# Patient Record
Sex: Female | Born: 1960 | Race: Black or African American | Hispanic: No | Marital: Married | Smoking: Never smoker
Health system: Southern US, Community
[De-identification: ages and names within clinical notes are randomized; demographics above are authoritative.]

## PROBLEM LIST (undated history)

## (undated) DIAGNOSIS — L659 Nonscarring hair loss, unspecified: Secondary | ICD-10-CM

## (undated) DIAGNOSIS — E7800 Pure hypercholesterolemia, unspecified: Secondary | ICD-10-CM

## (undated) DIAGNOSIS — Z79899 Other long term (current) drug therapy: Principal | ICD-10-CM

## (undated) DIAGNOSIS — K589 Irritable bowel syndrome without diarrhea: Secondary | ICD-10-CM

## (undated) DIAGNOSIS — Z87442 Personal history of urinary calculi: Secondary | ICD-10-CM

## (undated) DIAGNOSIS — K219 Gastro-esophageal reflux disease without esophagitis: Secondary | ICD-10-CM

## (undated) DIAGNOSIS — E739 Lactose intolerance, unspecified: Secondary | ICD-10-CM

## (undated) DIAGNOSIS — M5430 Sciatica, unspecified side: Secondary | ICD-10-CM

## (undated) DIAGNOSIS — L658 Other specified nonscarring hair loss: Secondary | ICD-10-CM

## (undated) DIAGNOSIS — F329 Major depressive disorder, single episode, unspecified: Secondary | ICD-10-CM

## (undated) DIAGNOSIS — I10 Essential (primary) hypertension: Secondary | ICD-10-CM

## (undated) DIAGNOSIS — R0602 Shortness of breath: Secondary | ICD-10-CM

## (undated) DIAGNOSIS — R7303 Prediabetes: Secondary | ICD-10-CM

## (undated) DIAGNOSIS — M545 Low back pain, unspecified: Secondary | ICD-10-CM

## (undated) DIAGNOSIS — K5900 Constipation, unspecified: Secondary | ICD-10-CM

## (undated) DIAGNOSIS — R232 Flushing: Secondary | ICD-10-CM

## (undated) HISTORY — DX: Constipation, unspecified: K59.00

## (undated) HISTORY — DX: Other specified nonscarring hair loss: L65.8

## (undated) HISTORY — DX: Essential (primary) hypertension: I10

## (undated) HISTORY — DX: Sciatica, unspecified side: M54.30

## (undated) HISTORY — DX: Gastro-esophageal reflux disease without esophagitis: K21.9

## (undated) HISTORY — DX: Prediabetes: R73.03

## (undated) HISTORY — DX: Flushing: R23.2

## (undated) HISTORY — DX: Shortness of breath: R06.02

## (undated) HISTORY — DX: Pure hypercholesterolemia, unspecified: E78.00

## (undated) HISTORY — PX: OTHER SURGICAL HISTORY: SHX169

## (undated) HISTORY — DX: Irritable bowel syndrome, unspecified: K58.9

## (undated) HISTORY — DX: Low back pain, unspecified: M54.50

## (undated) HISTORY — PX: ESOPHAGOGASTRODUODENOSCOPY: SHX1529

## (undated) HISTORY — PX: CHOLECYSTECTOMY: SHX55

## (undated) HISTORY — DX: Major depressive disorder, single episode, unspecified: F32.9

## (undated) HISTORY — DX: Personal history of urinary calculi: Z87.442

## (undated) HISTORY — DX: Lactose intolerance, unspecified: E73.9

## (undated) HISTORY — PX: TUBAL LIGATION: SHX77

## (undated) HISTORY — DX: Nonscarring hair loss, unspecified: L65.9

## (undated) HISTORY — DX: Other long term (current) drug therapy: Z79.899

---

## 2001-03-22 ENCOUNTER — Other Ambulatory Visit: Admission: RE | Admit: 2001-03-22 | Discharge: 2001-03-22 | Payer: Self-pay | Admitting: Obstetrics and Gynecology

## 2001-03-25 ENCOUNTER — Ambulatory Visit (HOSPITAL_COMMUNITY): Admission: RE | Admit: 2001-03-25 | Discharge: 2001-03-25 | Payer: Self-pay | Admitting: Obstetrics and Gynecology

## 2001-03-25 ENCOUNTER — Encounter: Payer: Self-pay | Admitting: Obstetrics and Gynecology

## 2001-05-10 ENCOUNTER — Other Ambulatory Visit: Admission: RE | Admit: 2001-05-10 | Discharge: 2001-05-10 | Payer: Self-pay | Admitting: Obstetrics and Gynecology

## 2003-01-28 ENCOUNTER — Ambulatory Visit (HOSPITAL_COMMUNITY): Admission: RE | Admit: 2003-01-28 | Discharge: 2003-01-28 | Payer: Self-pay | Admitting: Family Medicine

## 2003-01-28 ENCOUNTER — Encounter: Payer: Self-pay | Admitting: Family Medicine

## 2003-09-16 ENCOUNTER — Encounter (HOSPITAL_COMMUNITY): Admission: RE | Admit: 2003-09-16 | Discharge: 2003-10-16 | Payer: Self-pay | Admitting: Orthopedic Surgery

## 2004-04-08 ENCOUNTER — Ambulatory Visit (HOSPITAL_COMMUNITY): Admission: RE | Admit: 2004-04-08 | Discharge: 2004-04-08 | Payer: Self-pay | Admitting: Family Medicine

## 2004-05-12 ENCOUNTER — Emergency Department (HOSPITAL_COMMUNITY): Admission: EM | Admit: 2004-05-12 | Discharge: 2004-05-12 | Payer: Self-pay | Admitting: *Deleted

## 2004-12-30 ENCOUNTER — Ambulatory Visit (HOSPITAL_COMMUNITY): Admission: RE | Admit: 2004-12-30 | Discharge: 2004-12-30 | Payer: Self-pay | Admitting: Family Medicine

## 2005-01-04 ENCOUNTER — Ambulatory Visit (HOSPITAL_COMMUNITY): Admission: RE | Admit: 2005-01-04 | Discharge: 2005-01-04 | Payer: Self-pay | Admitting: General Surgery

## 2005-01-17 HISTORY — PX: OTHER SURGICAL HISTORY: SHX169

## 2005-02-03 ENCOUNTER — Inpatient Hospital Stay (HOSPITAL_COMMUNITY): Admission: RE | Admit: 2005-02-03 | Discharge: 2005-02-05 | Payer: Self-pay | Admitting: General Surgery

## 2005-02-03 ENCOUNTER — Encounter (INDEPENDENT_AMBULATORY_CARE_PROVIDER_SITE_OTHER): Payer: Self-pay | Admitting: General Surgery

## 2006-04-12 ENCOUNTER — Ambulatory Visit: Payer: Self-pay | Admitting: Orthopedic Surgery

## 2006-04-17 ENCOUNTER — Ambulatory Visit (HOSPITAL_COMMUNITY): Admission: RE | Admit: 2006-04-17 | Discharge: 2006-04-17 | Payer: Self-pay | Admitting: Orthopedic Surgery

## 2006-04-25 ENCOUNTER — Ambulatory Visit: Payer: Self-pay | Admitting: Orthopedic Surgery

## 2006-05-30 ENCOUNTER — Encounter: Admission: RE | Admit: 2006-05-30 | Discharge: 2006-05-30 | Payer: Self-pay | Admitting: Orthopedic Surgery

## 2006-06-06 ENCOUNTER — Ambulatory Visit: Payer: Self-pay | Admitting: Orthopedic Surgery

## 2006-06-21 ENCOUNTER — Encounter: Admission: RE | Admit: 2006-06-21 | Discharge: 2006-06-21 | Payer: Self-pay | Admitting: Orthopedic Surgery

## 2006-07-10 ENCOUNTER — Encounter: Admission: RE | Admit: 2006-07-10 | Discharge: 2006-07-10 | Payer: Self-pay | Admitting: Orthopedic Surgery

## 2007-01-24 ENCOUNTER — Ambulatory Visit (HOSPITAL_COMMUNITY): Admission: RE | Admit: 2007-01-24 | Discharge: 2007-01-24 | Payer: Self-pay | Admitting: Family Medicine

## 2007-02-04 ENCOUNTER — Ambulatory Visit (HOSPITAL_COMMUNITY): Admission: RE | Admit: 2007-02-04 | Discharge: 2007-02-04 | Payer: Self-pay | Admitting: Family Medicine

## 2007-02-22 ENCOUNTER — Ambulatory Visit (HOSPITAL_COMMUNITY): Admission: RE | Admit: 2007-02-22 | Discharge: 2007-02-22 | Payer: Self-pay | Admitting: Obstetrics and Gynecology

## 2007-02-26 ENCOUNTER — Ambulatory Visit: Admission: RE | Admit: 2007-02-26 | Discharge: 2007-02-26 | Payer: Self-pay | Admitting: Gynecology

## 2007-03-26 ENCOUNTER — Encounter (INDEPENDENT_AMBULATORY_CARE_PROVIDER_SITE_OTHER): Payer: Self-pay | Admitting: Obstetrics and Gynecology

## 2007-03-26 ENCOUNTER — Inpatient Hospital Stay (HOSPITAL_COMMUNITY): Admission: RE | Admit: 2007-03-26 | Discharge: 2007-03-28 | Payer: Self-pay | Admitting: Obstetrics and Gynecology

## 2007-04-30 ENCOUNTER — Ambulatory Visit: Admission: RE | Admit: 2007-04-30 | Discharge: 2007-04-30 | Payer: Self-pay | Admitting: Gynecology

## 2007-09-05 ENCOUNTER — Ambulatory Visit (HOSPITAL_COMMUNITY): Admission: RE | Admit: 2007-09-05 | Discharge: 2007-09-05 | Payer: Self-pay | Admitting: Family Medicine

## 2007-09-25 ENCOUNTER — Emergency Department (HOSPITAL_COMMUNITY): Admission: EM | Admit: 2007-09-25 | Discharge: 2007-09-25 | Payer: Self-pay | Admitting: Emergency Medicine

## 2007-09-27 ENCOUNTER — Ambulatory Visit (HOSPITAL_COMMUNITY): Admission: RE | Admit: 2007-09-27 | Discharge: 2007-09-27 | Payer: Self-pay | Admitting: Family Medicine

## 2007-09-28 ENCOUNTER — Ambulatory Visit (HOSPITAL_COMMUNITY): Admission: RE | Admit: 2007-09-28 | Discharge: 2007-09-28 | Payer: Self-pay | Admitting: Family Medicine

## 2007-10-04 ENCOUNTER — Encounter: Payer: Self-pay | Admitting: Family Medicine

## 2007-10-04 ENCOUNTER — Ambulatory Visit: Payer: Self-pay | Admitting: Cardiology

## 2007-10-04 ENCOUNTER — Ambulatory Visit (HOSPITAL_COMMUNITY): Admission: RE | Admit: 2007-10-04 | Discharge: 2007-10-04 | Payer: Self-pay | Admitting: Family Medicine

## 2008-10-06 ENCOUNTER — Ambulatory Visit (HOSPITAL_COMMUNITY): Admission: RE | Admit: 2008-10-06 | Discharge: 2008-10-06 | Payer: Self-pay | Admitting: Family Medicine

## 2008-10-08 ENCOUNTER — Encounter: Admission: RE | Admit: 2008-10-08 | Discharge: 2008-10-08 | Payer: Self-pay | Admitting: Family Medicine

## 2009-10-18 ENCOUNTER — Ambulatory Visit (HOSPITAL_COMMUNITY): Admission: RE | Admit: 2009-10-18 | Discharge: 2009-10-18 | Payer: Self-pay | Admitting: Family Medicine

## 2009-10-27 ENCOUNTER — Ambulatory Visit (HOSPITAL_COMMUNITY): Admission: RE | Admit: 2009-10-27 | Discharge: 2009-10-27 | Payer: Self-pay | Admitting: Family Medicine

## 2009-11-08 ENCOUNTER — Emergency Department (HOSPITAL_COMMUNITY): Admission: EM | Admit: 2009-11-08 | Discharge: 2009-11-08 | Payer: Self-pay | Admitting: Emergency Medicine

## 2010-05-04 ENCOUNTER — Ambulatory Visit (HOSPITAL_COMMUNITY): Admission: RE | Admit: 2010-05-04 | Discharge: 2010-05-04 | Payer: Self-pay | Admitting: Family Medicine

## 2010-07-09 ENCOUNTER — Encounter: Payer: Self-pay | Admitting: Family Medicine

## 2010-11-01 NOTE — Discharge Summary (Signed)
[name], DOUBLE NO.:  [id number]   MEDICAL RECORD NO.:  [id number]          PATIENT TYPE:  INP   LOCATION:  1539                         FACILITY:  [facility]   PHYSICIAN:  Malva Limes, M.D.    DATE OF BIRTH:  January 08, 1961   DATE OF ADMISSION:  03/26/2007  DATE OF DISCHARGE:  03/28/2007                               DISCHARGE SUMMARY   PRINCIPAL DISCHARGE DIAGNOSIS:  Multiple peritoneal inclusion cysts.   PRINCIPLE PROCEDURE:  Exploratory laparotomy with lysis of adhesions,  bilateral salpingo-oophorectomy.   HISTORY OF PRESENT ILLNESS:  [name] is a 50 year old black female  who was admitted on March 26, 2007 after she was discovered to have a  large complex pelvic mass by her primary care physician.  The patient  had a CT scan which revealed no evidence of lymphadenopathy.  There was  no abnormal Doppler flow on ultrasound.  The CA-125 level was normal.   The patient was undergoing an exploratory laparotomy with bilateral  salpingo-oophorectomy for possible ovarian carcinoma.   Patient underwent an exploratory laparotomy and was found to have  multiple peritoneal inclusion cysts.  A complete description of this  procedure can be found and dictated on the operative note.   Patient's postoperative course was benign.  The pathology was benign.  Patient was discharged home on postop day #2 doing well.  She will  follow up in the office in four weeks.           ______________________________  Malva Limes, M.D.     MA/MEDQ  D:  04/22/2007  T:  04/22/2007  Job:  [id number]

## 2010-11-01 NOTE — Consult Note (Signed)
[name], [name] NO.:  [id number]   MEDICAL RECORD NO.:  [id number]          PATIENT TYPE:  OUT   LOCATION:  GYN                          FACILITY:  [facility]   PHYSICIAN:  De Blanch, M.D.DATE OF BIRTH:  12/30/1960   DATE OF CONSULTATION:  04/30/2007  DATE OF DISCHARGE:                                 CONSULTATION   CHIEF COMPLAINT:  Postoperative followup.   INTERVAL HISTORY:  Patient underwent exploratory laparotomy, extensive  lysis of adhesions, and bilateral salpingo-oophorectomy on March 26, 2007.  Preoperatively, she had imaging consistent with a complex  multiloculated cystic mass in the pelvis.  This turned out to be  extensive adhesions.  Patient previously had a hysterectomy.  She has  had an uncomplicated postoperative course except for significant hot  flushes.  She has taken hormone replacement therapy to some extent with  some improvement of symptoms.  She has also tried some other alternative  therapies.   PHYSICAL EXAM:  Weight 178 pounds.  ABDOMEN:  Soft.  It is slightly sensitive to palpation.  Midline  incision is healing well.  PELVIC:  EG/BUS.  Vagina, bladder, and urethra are normal.  Cervix and  uterus surgically absent  Bimanual exam reveals no masses, induration,  nodularity, or tenderness.   IMPRESSION:  Good postoperative recovery.  In to treat what seems to be  a reactive peritoneal symptoms, patient is encouraged to use ibuprofen  600 to 800 mg every 6 to 8 hours p.r.n. abdominal pain.  She is given  the okay to return to work and plans on returning on November 18th.  At  this juncture, I would not restrict her activity and we will ask her to  return to Dr. Gerda Diss for continuing care including gynecologic followup.      De Blanch, M.D.  Electronically Signed     DC/MEDQ  D:  04/30/2007  T:  05/01/2007  Job:  [id number]   cc:   Malva Limes, M.D.  Fax: [phone]   Scott A. Gerda Diss, MD  Fax:  [phone]   Telford Nab, R.N.  [address]  [place], [place] [address]

## 2010-11-01 NOTE — Consult Note (Signed)
[name], [name] NO.:  [id number]   MEDICAL RECORD NO.:  [id number]          PATIENT TYPE:  OUT   LOCATION:  GYN                          FACILITY:  [facility]   PHYSICIAN:  De Blanch, M.D.DATE OF BIRTH:  [birth year]   DATE OF CONSULTATION:  02/26/2007  DATE OF DISCHARGE:                                 CONSULTATION   CHIEF COMPLAINT:  Pelvic mass.   HISTORY OF PRESENT ILLNESS:  Patient is a 50 year old African-American  female, seen in consultation requested by Dr. Dareen Piano, regarding  management of a large complex pelvic mass.  Apparently, the mass was  found on routine examination and then imaged via CT scan, showing a very  large complex mass.  This is cystic, but has some solid elements,  including mural nodules in the left pelvis, measuring 2.1 by 3.1 cm.  There are also some internal septations.  The overall size of the mass  is 14 by 15 cm.   PAST MEDICAL HISTORY:  MEDICAL ILLNESSES:  Moderate obesity.   CURRENT MEDICATIONS:  Zyrtec and fish oil.   PAST SURGICAL HISTORY:  1. Abdominal hysterectomy through a Pfannenstiel incision in 2006 for      abnormal uterine bleeding.  2. Laparoscopic cholecystectomy.  3. Kidney stone.  4. Bilateral tubal ligation.   DRUG ALLERGIES:  METRONIDAZOLE.   SOCIAL HISTORY:  The patient is married.  She is an Armed forces operational officer for  the [employer]  Her husband is a Emergency planning/management officer.   FAMILY HISTORY:  There is no family history of breast, ovarian or colon  cancer.   OBSTETRICAL HISTORY:  Gravida 2.   REVIEW OF SYSTEMS:  Ten-point comprehensive review of systems is  negative, except as noted above.   PHYSICAL EXAM:  Reveals an obese, black female, in no acute distress.  Weight 182 pounds, height 5 feet 5.  Blood pressure 160/92, pulse 72.  HEENT:  Negative.  NECK:  Supple without thyromegaly.  There is no supraclavicular or  inguinal adenopathy.  ABDOMEN:  Obese, soft, nontender, no masses,  organomegaly, ascites or  hernias noted.  She has a well-healed Pfannenstiel incision.  Palpation  of her iliac crest revealed a relatively narrow pelvis and I do not  believe that we will be able to resect this mass through her previous  Pfannenstiel incision.  PELVIC EXAM:  EG, BUS, vagina, bladder, urethra are normal.  Cervix and  uterus are surgically absent.  There is a cystic mass, which is somewhat  soft, filling the pelvis, and extending to approximately 3 cm below the  umbilicus.  RECTOVAGINAL EXAM:  Confirms.   IMPRESSION:  Large complex pelvic mass.  Given the nodularity and  septations, would recommend that the patient undergo exploratory  laparotomy through a midline incision, resection of the mass with  intraoperative frozen section.  I had a lengthy discussion with the  patient and her husband regarding surgical management and decisions  which would need to be made in the operating room as to whether surgical  staging due to an ovarian cancer were warranted or necessary, based on  frozen section.  They understand that, if this is a malignancy,  proceeding with omentectomy and pelvic and periaortic lymphadenectomy  and multiple biopsies would be appropriate.  Even if this is benign, the  patient is desirous of having her other ovary removed.  We discussed  briefly the possibility that she would need hormone replacement therapy,  depending upon her symptoms.  We will coordinate surgery with Dr.  Dareen Piano for the near future.      De Blanch, M.D.  Electronically Signed     DC/MEDQ  D:  02/26/2007  T:  02/27/2007  Job:  [id number]   cc:   Malva Limes, M.D.  Fax: [phone]   Telford Nab, R.N.  [address]  [place], [place] [address]

## 2010-11-01 NOTE — Op Note (Signed)
NAMENIKIRA, KUSHNIR NO.:  [id number]   MEDICAL RECORD NO.:  [id number]          PATIENT TYPE:  INP   LOCATION:  X003                         FACILITY:  [facility]   PHYSICIAN:  John T. Kyla Balzarine, M.D.    DATE OF BIRTH:  09-17-1960   DATE OF PROCEDURE:  03/26/2007  DATE OF DISCHARGE:                               OPERATIVE REPORT   SURGEON:  John T. Kyla Balzarine, M.D.   ASSISTANT:  Malva Limes, M.D.   PREOPERATIVE DIAGNOSIS:  Complex pelvic cyst; prior abdominal  hysterectomy.   POSTOPERATIVE DIAGNOSIS:  Multiple peritoneal inclusion cysts involving  the adnexa, sigmoid colon, bladder, cul-de-sac, and appendix.   PROCEDURE:  Lysis of extensive adhesions, bilateral salpingo-  oophorectomy.   ANESTHESIA:  General endotracheal.   FINDINGS:   INDICATIONS FOR PROCEDURE:  This 50 year old woman was status post total  abdominal hysterectomy through Pfannenstiel incision.  She presented  with an enlarging, 15 cm cyst that was complex with mural nodularity on  ultrasound.  CA125 value was less than 20 U/mL.  Examination under  anesthesia confirmed the presence of a very soft cystic mass filling the  pelvis.  On entry into the abdominal cavity, there were adhesions  involving the omentum and anterior abdominal wall which required  adhesiolysis.  The pelvis was filled with multiseptate, thin walled  peritoneal inclusion cysts that involved the appendix, sigmoid colon,  pelvic side walls including cul-de-sac, and bladder.  After taking down  the adhesions, the tubes and ovaries were essentially normal with the  exception of adhesions to the capsule of the ovary.  At the conclusion  of the procedure, Seprafilm was placed in the pelvis to hopefully  prevent future adhesions.   DESCRIPTION OF PROCEDURE:  After examination under anesthesia revealed  the findings described above, the patient was prepped and draped in the  low lithotomy position with direct placement stirrups.   Foley catheter  was placed sterilely.  Exploration was carried out through a midline  subumbilical incision which was developed with scalpel and  electrocautery for hemostasis.  The omentum was densely adherent to the  anterior abdominal wall and initially indistinguishable for  properitoneal fat.  Entry into the peritoneal cavity occurred  transomentally and we recognized that the omentum was densely adherent  along the prior peritoneal incision.  Using sharp and blunt dissection  with electrocautery, the omentum was dissected off of the anterior  abdominal wall.  A figure-of-eight suture of 0 Vicryl was placed in the  mid omentum, at the site of the prior fenestration.  Electrocautery was  used for hemostasis.  Manual and visual exploration of the upper abdomen  revealed no palpable abnormalities.  The patient was placed in  Trendelenburg, Bookwalter retractor placed, and bowel packed out of the  pelvis, revealing the findings described above.  There were multiple  thin-walled peritoneal inclusion cysts that involved the appendix,  sigmoid colon, adnexa bilaterally, and bladder.  The sigmoid colon was  adherent to the left side wall and bladder, and the appendix was  adherent to the right adnexa.  Using sharp and blunt dissection with  electrocautery for hemostasis, dissection proceeded.  Because of the  thin-walled nature of the cysts, one loculae was entered and it was  immediately apparent that this was a peritoneal inclusion cyst.  Subsequently, the wall of the cyst was opened, freeing the appendix and  sigmoid colon from the right adnexa.  Other than dense adhesions to the  capsule of the ovary, tip of the appendix, and sigmoid colon.  The right  round ligament was elevated, transected with electrocautery, and the  broad ligament opened.  The ureter was identified and infundibulopelvic  ligament was skeletonized above the ureter, cross clamped, divided, and  ligated with free tie  and suture ligature of 2-0 Vicryl.  Using sharp  and blunt dissection with electrocautery, the right tube and ovary were  mobilized off of the right side wall above the ureter.  Using sharp and  blunt dissection with electrocautery, adhesions between the ovary and  both sigmoid colon and appendix were lysed, removing all visible ovarian  capsule.  Several smaller loculations were opened and cyst wall  resected, being submitted along with one of the adnexal specimens.  The  sigmoid colon was freed from the bladder and left pelvic side wall using  sharp and blunt dissection with electrocautery for hemostasis.  The wall  of the peritoneal inclusion cyst over the left adnexum was opened.  The  left pelvic side wall was opened above the infundibulopelvic ligament.  The ureter was identified and infundibulopelvic ligament was  skeletonized above the ureter.  The IP ligament was cross clamped,  divided, and ligated with free tie and suture ligature.  Using sharp and  blunt dissection with electrocautery, the left tube and ovary were  mobilized from the left pelvic side wall.  Dense adhesions to the  sigmoid colon were taken down sharply, removing all ovarian capsule.  The pelvis was inspected and additional hemostasis achieved where needed  with electrocautery.  All peritoneal inclusion cysts were opened.  After  assuring hemostasis, one sheet of Seprafilm was placed across the pelvis  to cover raw surfaces particularly on the left side wall and hopefully  to prevent further formation of inclusion cysts.  Sponges and retractors  were removed.  The abdominal wall was closed in layers, irrigating and  assuring hemostasis at each level.  The rectus fascia and muscles were  closed together in a mass running closure of 0 PDS and the skin with  skin clips.  The patient tolerated the procedure well and returned to  the recovery room in stable condition.   ESTIMATED BLOOD LOSS:  100 mL.    TRANSFUSIONS:  None.   PATHOLOGY:  Bilateral tubes, ovaries, and cyst wall to pathology for  permanent section.      John T. Kyla Balzarine, M.D.  Electronically Signed     JTS/MEDQ  D:  03/26/2007  T:  03/26/2007  Job:  [id number]   cc:   Malva Limes, M.D.  Fax: [phone]   Telford Nab, R.N.  [address]  [place], [place] [address]

## 2010-11-04 NOTE — Op Note (Signed)
NAMEBRENNYN, [name] NO.:  [id number]   MEDICAL RECORD NO.:  [id number]          PATIENT TYPE:  AMB   LOCATION:  DAY                           FACILITY:  [facility]   PHYSICIAN:  Jerolyn Shin C. Katrinka Blazing, M.D.   DATE OF BIRTH:  1960-06-25   DATE OF PROCEDURE:  02/03/2005  DATE OF DISCHARGE:                                 OPERATIVE REPORT   PREOPERATIVE DIAGNOSIS:  Dysfunctional uterine bleeding with uterine  fibroids.   POSTOPERATIVE DIAGNOSIS:  Dysfunction uterine bleeding with uterine  fibroids.   PROCEDURE:  Total abdominal hysterectomy.   SURGEON:  Dr. Katrinka Blazing.   DESCRIPTION:  Under general anesthesia, the patient's abdomen was prepped  and draped in a sterile field. A limited Pfannenstiel incision was made.  Upon entering the abdomen, there was a small pelvic fluid. Uterus was  moderately enlarged and nodular. The right ovary which previously had a  hypodense the mass was normal. It was felt that this was a hemorrhagic cyst.  At the time of laparotomy, it was totally normal with no cystic changes of  the right ovary. There was a small hemorrhagic cyst of the left ovary.  Because the right ovary looked normal, it was elected not to do a right  salpingo-oophorectomy as previously discussed with the patient. The upper  abdomen was inspected. No abnormality was found. The patient did have a very  large amount of intra-abdominal visceral fat. The abdomen was packed off.  Round ligaments were isolated, clamped, tied with ligature of 0 Vicryl and  divided. The tubo-ovarian complex was doubly clamped at its junction with  the uterus bilaterally. They were divided. Hemostasis was controlled with  ligatures of 0 Vicryl. Anterior bladder flap was developed. The uterine  vessels were then doubly clamped sequentially with a Heaney clamp, divided  and controlled with a Heaney suture of 0 Vicryl. This was continued down to  the apex of the vagina bilaterally. Uterosacrals were doubly  clamped,  divided and controlled with 0 Vicryl. Using a curved Heaney, the  cervicovaginal junction was clamped, divided and controlled with ligature of  0 Vicryl. At this point, the vagina was entered. The posterior and anterior  wall were grasped with Allis clamps. The cervix was then excised without  difficulty. Hemostasis was achieved. The residual opening of the apex of  vagina was closed with interrupted 0 Vicryl. It was then reinforced using a  running 0 Monocryl. Hemostasis was achieved. The pelvis was reperitonealized  using 2-0 Monocryl. Further irrigation was carried out. There was no  bleeding. Sponge, needle, instrument, and blade counts were verified as  correct x2. Peritoneum was closed with 2-0 Monocryl. Muscle was  reapproximated with 2-0 Monocryl. Fascia was closed with running 0 Prolene.  Subcutaneous tissue was  irrigated and then closed with interrupted 2-0 Monocryl. Skin was closed  with staples. Dressing was placed. The patient tolerated the procedure well.  She was awakened from anesthesia uneventfully, transferred to a bed and  taken to the postanesthetic care unit for monitoring.      Dirk Dress. Katrinka Blazing, M.D.  Electronically Signed  LCS/MEDQ  D:  02/03/2005  T:  02/03/2005  Job:  [id number]   cc:   Lorin Picket A. Gerda Diss, MD  [address]  [place]  [place] [address]  Fax: [phone]

## 2010-11-04 NOTE — H&P (Signed)
[name], [name] NO.:  [id number]   MEDICAL RECORD NO.:  [id number]          PATIENT TYPE:  AMB   LOCATION:  DAY                           FACILITY:  [facility]   PHYSICIAN:  Jerolyn Shin C. Katrinka Blazing, M.D.   DATE OF BIRTH:  [birth year]   DATE OF ADMISSION:  DATE OF DISCHARGE:  LH                                HISTORY & PHYSICAL   A 50 year old female with a history of recurrent severe abdominal pain  dating back about 1-1/2 months.  The pain is in the right pelvis, radiating  through to her back and thigh.  She has a long history of uterine fibroids.  She has had regular menses but has had difficulty with increased clotting.   Serial exams have revealed tender right adnexa.  The CT of the abdomen was  nonrevealing, but a pelvic ultrasound showed an enlarged uterus with a  uterine fibroid anteriorly, which projects transmurally.  She also has a  very thickened endometrial stripe.  Her left ovary was normal, but her right  ovary was 4 x 3 x 3.5 cm with a hypoechoic central nodule felt to represent  the hemorrhagic cyst.  The patient has been followed and has continued pain.  She is status post BTL.  Pregnancy test is negative.  The patient was  initially scheduled for a diagnostic pelvic laparoscopy but after some  consideration, she wishes to proceed with open laparotomy with hysterectomy  and removal of the right ovary.  It is felt that this is a reasonable and  appropriate thing to do.  She is scheduled for the surgery.   PAST MEDICAL HISTORY:  She has hypertension and gastroesophageal reflux  disease.   PAST SURGICAL HISTORY:  Tubal ligation, laparoscopic cholecystectomy, and  retrograde ureteral stone extraction.   MEDICATIONS:  1.  Indapamide 1.25 mg daily.  2.  Klor-Con 10 mEq daily.  3.  Hydrocodone 5/500 every 4 hours as needed for pain.   PHYSICAL EXAMINATION:  VITAL SIGNS:  Blood pressure 124/82, pulse 72,  respirations 20.  Weight 179 pounds.  HEENT:   Unremarkable.  NECK:  Supple.  No JVD, bruits, adenopathy, or thyromegaly.  CHEST:  Clear to auscultation.  HEART:  Regular rate and rhythm without murmur, rub or gallop.  ABDOMEN:  Mild right lower quadrant and suprapubic tenderness with normal  upper abdomen and normal bowel sounds.  PELVIC:  Tender uterus with right adnexal tenderness.  EXTREMITIES:  No clubbing, cyanosis or edema.  NEUROLOGIC:  No focal deficits.   IMPRESSION:  1.  Recurrent pelvic pain with increasing dysfunction uterine bleeding in a      patient with uterine fibroids and hemorrhagic right ovary.  She is      status post tubal ligation.  It is the patient's option to proceed with      open laparotomy with removal of the right ovary and hysterectomy.  2.  Hypertension.  3.  Gastroesophageal reflux disease.   PLAN:  Total abdominal hysterectomy with probable salpingo-oophorectomy.      Dirk Dress. Katrinka Blazing, M.D.  Electronically Signed     LCS/MEDQ  D:  02/02/2005  T:  02/02/2005  Job:  [id number]

## 2011-02-10 ENCOUNTER — Other Ambulatory Visit: Payer: Self-pay | Admitting: Family Medicine

## 2011-02-10 DIAGNOSIS — N644 Mastodynia: Secondary | ICD-10-CM

## 2011-03-01 ENCOUNTER — Encounter (HOSPITAL_COMMUNITY): Payer: Self-pay

## 2011-03-08 ENCOUNTER — Ambulatory Visit (HOSPITAL_COMMUNITY)
Admission: RE | Admit: 2011-03-08 | Discharge: 2011-03-08 | Disposition: A | Discharge status: Home / Self Care | Payer: PRIVATE HEALTH INSURANCE | Source: Ambulatory Visit | Attending: Family Medicine | Admitting: Family Medicine

## 2011-03-08 ENCOUNTER — Other Ambulatory Visit: Payer: Self-pay | Admitting: Family Medicine

## 2011-03-08 DIAGNOSIS — N644 Mastodynia: Secondary | ICD-10-CM

## 2011-03-08 DIAGNOSIS — Z1231 Encounter for screening mammogram for malignant neoplasm of breast: Secondary | ICD-10-CM | POA: Insufficient documentation

## 2011-03-14 LAB — CBC
HCT: 34.2 — ABNORMAL LOW
Hemoglobin: 11.9 — ABNORMAL LOW
MCHC: 34.8
MCV: 85.1
Platelets: 244
RBC: 4.03
RDW: 13.9
WBC: 6.1

## 2011-03-14 LAB — DIFFERENTIAL
Basophils Absolute: 0
Basophils Relative: 1
Eosinophils Absolute: 0.1
Eosinophils Relative: 2
Lymphocytes Relative: 33
Lymphs Abs: 2
Monocytes Absolute: 0.5
Monocytes Relative: 8
Neutro Abs: 3.5
Neutrophils Relative %: 57

## 2011-03-14 LAB — BASIC METABOLIC PANEL
CO2: 28
Calcium: 9.1
GFR calc non Af Amer: >60
Glucose, Bld: 116 — ABNORMAL HIGH
Potassium: 3.4 — ABNORMAL LOW
Sodium: 140

## 2011-03-14 LAB — POCT CARDIAC MARKERS
CKMB, poc: <1 — ABNORMAL LOW
Myoglobin, poc: 34.2
Operator id: 216221
Troponin i, poc: <0.05

## 2011-03-14 LAB — BASIC METABOLIC PANEL WITH GFR
BUN: 13
Chloride: 105
Creatinine, Ser: 0.84
GFR calc Af Amer: >60

## 2011-03-30 LAB — BASIC METABOLIC PANEL
CO2: 30
Calcium: 8.5
Creatinine, Ser: 0.87
GFR calc Af Amer: >60
GFR calc non Af Amer: >60
Sodium: 138

## 2011-03-30 LAB — COMPREHENSIVE METABOLIC PANEL
ALT: 18
AST: 17
Calcium: 9.5
Creatinine, Ser: 0.95
GFR calc Af Amer: >60
Sodium: 137
Total Protein: 7.2

## 2011-03-30 LAB — CBC
Hemoglobin: 10.9 — ABNORMAL LOW
MCHC: 33.8
MCHC: 34.6
MCV: 87.3
RBC: 3.66 — ABNORMAL LOW
RDW: 13.9
WBC: 11.5 — ABNORMAL HIGH

## 2011-03-30 LAB — DIFFERENTIAL
Eosinophils Absolute: 0.1
Lymphocytes Relative: 19
Lymphs Abs: 1.5
Monocytes Relative: 5
Neutrophils Relative %: 75

## 2011-03-30 LAB — ABO/RH: ABO/RH(D): A POS

## 2011-03-30 LAB — TYPE AND SCREEN: Antibody Screen: NEGATIVE

## 2011-06-14 ENCOUNTER — Encounter: Payer: Self-pay | Admitting: Gastroenterology

## 2011-06-21 ENCOUNTER — Ambulatory Visit: Payer: PRIVATE HEALTH INSURANCE | Admitting: Gastroenterology

## 2011-06-29 ENCOUNTER — Ambulatory Visit (INDEPENDENT_AMBULATORY_CARE_PROVIDER_SITE_OTHER): Payer: PRIVATE HEALTH INSURANCE | Admitting: Gastroenterology

## 2011-06-29 ENCOUNTER — Ambulatory Visit: Payer: PRIVATE HEALTH INSURANCE | Admitting: Gastroenterology

## 2011-06-29 ENCOUNTER — Encounter: Payer: Self-pay | Admitting: Gastroenterology

## 2011-06-29 DIAGNOSIS — R109 Unspecified abdominal pain: Secondary | ICD-10-CM | POA: Insufficient documentation

## 2011-06-29 MED ORDER — PEG-KCL-NACL-NASULF-NA ASC-C 100 G PO SOLR: 1.0000 | Freq: Once | ORAL | Status: DC

## 2011-06-29 NOTE — Progress Notes (Signed)
Reminder in epic to follow up in 3 months with SF/E30/abd pain

## 2011-06-29 NOTE — Patient Instructions (Addendum)
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  TAKE ALIGN DAILY. AVOID DAIRY FOR ONE MONTH. SEE INFO BELOW. CONTINUE SEAWEED EXTRACT. ENDOSCOPY ON JAN 15. FOLLOW UP IN 3 MOS.  Irritable Bowel Syndrome (Spastic Colon) Irritable Bowel Syndrome (IBS) is caused by a disturbance of normal bowel function. Other terms used are spastic colon, mucous colitis, and irritable colon. It does not require surgery, nor does it lead to cancer. There is no cure for IBS. But with proper diet, stress reduction, and medication, you will find that your problems (symptoms) will gradually disappear or improve. IBS is a common digestive disorder. Women develop it twice as often as men.  CAUSES After food has been digested and absorbed in the small intestine, waste material is moved into the colon (large intestine). In the colon, water and salts are absorbed from the undigested products coming from the small intestine. The remaining residue, or fecal material, is held for elimination. Under normal circumstances, gentle, rhythmic contractions on the bowel walls push the fecal material along the colon towards the rectum. In IBS, however, these contractions are irregular and poorly coordinated.   SYMPTOMS  The most common symptom of IBS is pain. It is typically in the lower left side of the belly (abdomen). But it may occur anywhere in the abdomen. It can be felt as heartburn, backache, or even as a dull pain in the arms or shoulders. The pain comes from excessive bowel-muscle spasms and from the buildup of gas and fecal material in the colon. This pain:  Can range from sharp belly (abdominal) cramps to a dull, continuous ache.   Usually worsens soon after eating.   Is typically relieved by having a bowel movement or passing gas.    Abdominal pain is usually accompanied by constipation OR diarrhea. The diarrhea typically occurs right after a meal or upon arising in the morning. The stools are typically soft and watery. They  are often flecked with secretions (mucus).  Other symptoms of IBS include:  Bloating.  Loss of appetite.   Heartburn.  Feeling sick to your stomach  (nausea).   Belching  Vomiting   Gas.   IBS may also cause a number of symptoms that are unrelated to the digestive system:  Fatigue.  Headaches.   Anxiety  Shortness of breath   Difficulty in concentrating.  Dizziness.   These symptoms tend to come and go.  DIAGNOSIS  TREATMENT A number of medications are available to help correct bowel function and/or relieve bowel spasms and abdominal pain. Among the drugs available are:  Mild, non-irritating laxatives for severe constipation and to help restore normal bowel habits.   Specific anti-diarrheal medications to treat severe or prolonged diarrhea.   Anti-spasmodic agents to relieve intestinal cramps.   HOME CARE INSTRUCTIONS   Avoid foods that are high in fat or oils. Some examples ZOX:WRUEA cream, butter, frankfurters, sausage, and other fatty meats.   Avoid foods that have a laxative effect, such as fruit, fruit juice, and dairy products.   Cut out carbonated drinks, chewing gum, and "gassy" foods, such as beans and cabbage. This may help relieve bloating and belching.   Bran taken with plenty of liquids may help relieve constipation.   Keep track of what foods seem to trigger your symptoms.   Avoid emotionally charged situations or circumstances that produce anxiety.   Start or continue exercising.   Get plenty of rest and sleep.    Lactose Free Diet Lactose is a carbohydrate that is found mainly in  milk and milk products, as well as in foods with added milk or whey. Lactose must be digested by the enzyme in order to be used by the body. Lactose intolerance occurs when there is a shortage of lactase. When your body is not able to digest lactose, you may feel sick to your stomach (nausea), bloating, cramping, gas and diarrhea.   Tolerance to lactose varies  widely There are many dairy products that may be tolerated better than milk by some people:  The use of cultured dairy products such as yogurt, buttermilk, cottage cheese, and sweet acidophilus milk (Kefir) for lactase-deficient individuals is usually well tolerated. This is because the healthy bacteria help digest lactose.   Lactose-hydrolyzed milk (Lactaid) contains 40-90% less lactose than milk and may also be well tolerated.    SPECIAL NOTES  Lactose is a carbohydrates. The major food source is dairy products. Reading food labels is important. Many products contain lactose even when they are not made from milk. Look for the following words: whey, milk solids, dry milk solids, nonfat dry milk powder. Typical sources of lactose other than dairy products include breads, candies, cold cuts, prepared and processed foods, and commercial sauces and gravies.   All foods must be prepared without milk, cream, or other dairy foods.   Soy milk and lactose-free supplements (LACTASE) may be used as an alternative to milk.   FOOD GROUP ALLOWED/RECOMMENDED AVOID/USE SPARINGLY  BREADS / STARCHES 4 servings or more* Breads and rolls made without milk. [place], [place], or [place] bread. Breads and rolls that contain milk. Prepared mixes such as muffins, biscuits, waffles, pancakes. Sweet rolls, donuts, [place] toast (if made with milk or lactose).  Crackers: Soda crackers, graham crackers. Any crackers prepared without lactose. Zwieback crackers, corn curls, or any that contain lactose.  Cereals: Cooked or dry cereals prepared without lactose (read labels). Cooked or dry cereals prepared with lactose (read labels). Total, Cocoa Krispies. Special K.  Potatoes / Pasta / Rice: Any prepared without milk or lactose. Popcorn. Instant potatoes, frozen [place] fries, scalloped or au gratin potatoes.  VEGETABLES 2 servings or more Fresh, frozen, and canned vegetables. Creamed or breaded vegetables. Vegetables in a  cheese sauce or with lactose-containing margarines.  FRUIT 2 servings or more All fresh, canned, or frozen fruits that are not processed with lactose. Any canned or frozen fruits processed with lactose.  MEAT & SUBSTITUTES 2 servings or more (4 to 6 oz. total per day) Plain beef, chicken, fish, [place], lamb, veal, pork, or ham. Kosher prepared meat products. Strained or junior meats that do not contain milk. Eggs, soy meat substitutes, nuts. Scrambled eggs, omelets, and souffles that contain milk. Creamed or breaded meat, fish, or fowl. Sausage products such as wieners, liver sausage, or cold cuts that contain milk solids. Cheese, cottage cheese, or cheese spreads.  MILK None. (See "BEVERAGES" for milk substitutes. See "DESSERTS" for ice cream and frozen desserts.) Milk (whole, 2%, skim, or chocolate). Evaporated, powdered, or condensed milk; malted milk.  SOUPS & COMBINATION FOODS Bouillon, broth, vegetable soups, clear soups, consomms. Homemade soups made with allowed ingredients. Combination or prepared foods that do not contain milk or milk products (read labels). Cream soups, chowders, commercially prepared soups containing lactose. Macaroni and cheese, pizza. Combination or prepared foods that contain milk or milk products.  DESSERTS & SWEETS In moderation Water and fruit ices; gelatin; angel food cake. Homemade cookies, pies, or cakes made from allowed ingredients. Pudding (if made with water or a milk substitute). Lactose-free  tofu desserts. Sugar, honey, corn syrup, jam, jelly; marmalade; molasses (beet sugar); Pure sugar candy; marshmallows. Ice cream, ice milk, sherbet, custard, pudding, frozen yogurt. Commercial cake and cookie mixes. Desserts that contain chocolate. Pie crust made with milk-containing margarine; reduced-calorie desserts made with a sugar substitute that contains lactose. Toffee, peppermint, butterscotch, chocolate, caramels.  FATS & OILS In moderation Butter (as tolerated;  contains very small amounts of lactose). Margarines and dressings that do not contain milk, Vegetable oils, shortening, Miracle Whip, mayonnaise, nondairy cream & whipped toppings without lactose or milk solids added (examples: Coffee Rich, Carnation Coffeemate, Rich's Whipped Topping, PolyRich). Tomasa Blase. Margarines and salad dressings containing milk; cream, cream cheese; peanut butter with added milk solids, sour cream, chip dips, made with sour cream.  BEVERAGES Carbonated drinks; tea; coffee and freeze-dried coffee; some instant coffees (check labels). Fruit drinks; fruit and vegetable juice; Rice or Soy milk. Ovaltine, hot chocolate. Some cocoas; some instant coffees; instant iced teas; powdered fruit drinks (read labels).   CONDIMENTS / MISCELLANEOUS Soy sauce, carob powder, olives, gravy made with water, baker's cocoa, pickles, pure seasonings and spices, wine, pure monosodium glutamate, catsup, mustard. Some chewing gums, chocolate, some cocoas. Certain antibiotics and vitamin / mineral preparations. Spice blends if they contain milk products. MSG extender. Artificial sweeteners that contain lactose such as Equal (Nutra-Sweet) and Sweet 'n Low. Some nondairy creamers (read labels).  SAMPLE MENU*  Breakfast   Orange Juice.  Banana.   Bran flakes.   Nondairy Creamer.  Vienna Bread (toasted).   Butter or milk-free margarine.   Coffee or tea.    Noon Meal   Chicken Breast.  Rice.   Green beans.   Butter or milk-free margarine.  Fresh melon.   Coffee or tea.    Evening Meal   Roast Beef.  Baked potato.   Butter or milk-free margarine.   Broccoli.   Lettuce salad with vinegar and oil dressing.  [employer].   Coffee or tea.

## 2011-06-29 NOTE — Progress Notes (Signed)
Subjective:    Patient ID: [name], female    DOB: 09/21/1960, 51 y.o.   MRN: [id number]  PCP: [facility]  HPI  REG bm PATTERN: 1-2X/DAY SOLID. NO PMHx OF IBS.  Sx since NOV 22 with diarrhea AFTER HOMEMADE STEW & OLD MEAT SANDWICH. No fever, chills, or stool studies. Nauseous all the times sine NOV but no vomiting. Use ASA daily. No BC, GOODY'S, IBUPROFEN, MOTRIN, OR ALEVE. NO ETOH. NO H. PYLORI TEST OR INFECTION. No problems swallowing. Sx same except Bms: soft. BMs: 3 times this AM-soft. No blood in her stool, but saw mucous. PROTONIX SINCE 2008 FOR REFLUX. nO WEIGHT GAIN. WEIGHT LOSS: 5 BS SINCE SEP ON PURPOSE.   HAS HAD epigastric, luq, llq PAIN AFTER the diarrhea started. Pain the same. Pain off and on. Triggers: if she eats then she has pain. Well water. No NH or hospital visits. No CDIff contact. Abx: NOV 2012-IT STOPPED THE DIARRHEA AND WHEN SHE GOT DONE STILL HAVING FREQUENT BMs. nO CHANGE IN STRESS OR DIET. nO MILK, ICE CREAM, rare: cheese-20 pieces if she's having eggs or a cheeseburger. No new meds or OTC supplements, teas, or herbs EXCEPT SEAWEED. NO PROBIOTICS.  Past Medical History  Diagnosis Date  . Hypertension   . Gastroesophageal reflux disease    Past Surgical History  Procedure Date  . Tubal ligation   . Cholecystectomy   . Retrograde ureteral stone extraction   . Total abdominal hysterectomy with probable salpingo-oophorectomy 2006-8    HEAVY MENSES/? CANCER    Allergies  Allergen Reactions  . Metronidazole Hives    Current Outpatient Prescriptions  Medication Sig Dispense Refill  . aspirin 81 MG tablet Take 160 mg by mouth daily.      . calcium carbonate (OS-CAL) 600 MG TABS Take 600 mg by mouth 2 (two) times daily with a meal.      . Cholecalciferol (VITAMIN D-3 PO) Take 400 Units by mouth daily.      . Enalapril-Hydrochlorothiazide 5-12.5 MG per tablet Take 1 tablet by mouth daily.      . estrogens, conjugated, (PREMARIN) 0.625 MG tablet Take 0.625  mg by mouth daily. Take daily for 21 days then do not take for 7 days.      . fish oil-omega-3 fatty acids 1000 MG capsule Take 2 g by mouth daily.      . Multiple Vitamin (MULTIVITAMIN) capsule Take 1 capsule by mouth daily.      . pantoprazole (PROTONIX) 40 MG tablet Take 40 mg by mouth daily.          Review of Systems  Unable to perform ROS      Objective:   Physical Exam  Vitals reviewed. Constitutional: She is oriented to person, place, and time. She appears well-developed and well-nourished. No distress.  HENT:  Head: Normocephalic and atraumatic.  Eyes: Pupils are equal, round, and reactive to light. No scleral icterus.  Neck: Normal range of motion. Neck supple.  Cardiovascular: Normal rate and regular rhythm.   Pulmonary/Chest: Effort normal and breath sounds normal. No respiratory distress.  Abdominal: Soft. Bowel sounds are normal. She exhibits no distension. There is tenderness (MILD TTP IN LLQ). There is no rebound and no guarding.  Musculoskeletal: Normal range of motion. She exhibits no edema.  Lymphadenopathy:    She has no cervical adenopathy.  Neurological: She is alert and oriented to person, place, and time.       NO FOCAL DEFICITS   Skin: No rash noted.    Psychiatric: She has a normal mood and affect.          Assessment & Plan:   

## 2011-06-29 NOTE — Assessment & Plan Note (Signed)
IN THE EPIGASTRIUM, LUQ, AND LLQ & ASSOCIATED WITH DIARRHEA, NOW WITH SOFT STOOL AND INTERMITTENT ABD  PAIN THAT GETS BETTER WITH PASSING GAS OR STOOL.   Sx MOST LIKELY 2O TO POST-INFECTIOUS IBS +/- LACTOSE INTOLERANCE, less likely IBD, Giardiasis, PUD, H PYLORI GASTRITIS, OR CELIAC SPRUE.  EGD TO EVALUATE DYSPEPSIA/EPIGASTRIC PAIN. WILL BIOPSY STOMACH/?DUODENUM. TCS FOR SCREENING. CONSIDER RANDOM BIOPSIES FOR MICRSCOPIC COLITIS. ADD ALIGN DAILY. CONTINUE SEAWEED. AVOID DAIRY. OPV IN 3 MOS.

## 2011-06-29 NOTE — Progress Notes (Signed)
Cc to PCP 

## 2011-07-03 ENCOUNTER — Telehealth: Payer: Self-pay

## 2011-07-03 MED ORDER — SODIUM CHLORIDE 0.45 % IV SOLN
Freq: Once | INTRAVENOUS | Status: AC
  Administered 2011-07-04: 1000 mL via INTRAVENOUS

## 2011-07-03 NOTE — Telephone Encounter (Signed)
Pt left VM that she is scheduled for a procedure on 07/04/2011. She said that we need to fax an FMLA paper to [phone]  [employer] [place] [employer].( Pt works at [employer] ). I called and told pt we do not have FMLA forms, she will need to get them and if Dr. Darrick Penna needs to sign anything she will be in the office on Wed and Thurs this week. I told her if she needed info faxed stating she has a procedure on 07/04/2011, that I will be glad to do that for her. She said she will check on paperwork and call back and let me know.

## 2011-07-04 ENCOUNTER — Other Ambulatory Visit: Payer: Self-pay | Admitting: Gastroenterology

## 2011-07-04 ENCOUNTER — Encounter (HOSPITAL_COMMUNITY)
Admission: RE | Disposition: A | Discharge status: Home / Self Care | Payer: Self-pay | Source: Ambulatory Visit | Attending: Gastroenterology

## 2011-07-04 ENCOUNTER — Ambulatory Visit (HOSPITAL_COMMUNITY)
Admission: RE | Admit: 2011-07-04 | Discharge: 2011-07-04 | Disposition: A | Discharge status: Home / Self Care | Payer: PRIVATE HEALTH INSURANCE | Source: Ambulatory Visit | Attending: Gastroenterology | Admitting: Gastroenterology

## 2011-07-04 ENCOUNTER — Encounter (HOSPITAL_COMMUNITY): Payer: Self-pay | Admitting: *Deleted

## 2011-07-04 DIAGNOSIS — K2990 Gastroduodenitis, unspecified, without bleeding: Secondary | ICD-10-CM

## 2011-07-04 DIAGNOSIS — R197 Diarrhea, unspecified: Secondary | ICD-10-CM | POA: Insufficient documentation

## 2011-07-04 DIAGNOSIS — R109 Unspecified abdominal pain: Secondary | ICD-10-CM | POA: Insufficient documentation

## 2011-07-04 DIAGNOSIS — Z79899 Other long term (current) drug therapy: Secondary | ICD-10-CM | POA: Insufficient documentation

## 2011-07-04 DIAGNOSIS — K2970 Gastritis, unspecified, without bleeding: Secondary | ICD-10-CM

## 2011-07-04 DIAGNOSIS — Z7982 Long term (current) use of aspirin: Secondary | ICD-10-CM | POA: Insufficient documentation

## 2011-07-04 DIAGNOSIS — D126 Benign neoplasm of colon, unspecified: Secondary | ICD-10-CM

## 2011-07-04 DIAGNOSIS — K2940 Chronic atrophic gastritis without bleeding: Secondary | ICD-10-CM | POA: Insufficient documentation

## 2011-07-04 DIAGNOSIS — K5730 Diverticulosis of large intestine without perforation or abscess without bleeding: Secondary | ICD-10-CM | POA: Insufficient documentation

## 2011-07-04 DIAGNOSIS — I10 Essential (primary) hypertension: Secondary | ICD-10-CM | POA: Insufficient documentation

## 2011-07-04 DIAGNOSIS — K648 Other hemorrhoids: Secondary | ICD-10-CM | POA: Insufficient documentation

## 2011-07-04 HISTORY — PX: COLONOSCOPY: SHX174

## 2011-07-04 SURGERY — COLONOSCOPY WITH ESOPHAGOGASTRODUODENOSCOPY (EGD)
Anesthesia: Moderate Sedation

## 2011-07-04 MED ORDER — MEPERIDINE HCL 100 MG/ML IJ SOLN
INTRAMUSCULAR | Status: AC
  Filled 2011-07-04: qty 1

## 2011-07-04 MED ORDER — STERILE WATER FOR IRRIGATION IR SOLN
Status: DC | PRN
  Administered 2011-07-04: 15:00:00

## 2011-07-04 MED ORDER — BUTAMBEN-TETRACAINE-BENZOCAINE 2-2-14 % EX AERO
INHALATION_SPRAY | CUTANEOUS | Status: DC | PRN
  Administered 2011-07-04: 2 via TOPICAL

## 2011-07-04 MED ORDER — MIDAZOLAM HCL 5 MG/5ML IJ SOLN
INTRAMUSCULAR | Status: AC
  Filled 2011-07-04: qty 10

## 2011-07-04 MED ORDER — MEPERIDINE HCL 100 MG/ML IJ SOLN
INTRAMUSCULAR | Status: DC | PRN
  Administered 2011-07-04: 25 mg via INTRAVENOUS
  Administered 2011-07-04: 50 mg via INTRAVENOUS
  Administered 2011-07-04: 25 mg via INTRAVENOUS

## 2011-07-04 MED ORDER — MIDAZOLAM HCL 5 MG/5ML IJ SOLN
INTRAMUSCULAR | Status: DC | PRN
  Administered 2011-07-04 (×2): 1 mg via INTRAVENOUS
  Administered 2011-07-04 (×2): 2 mg via INTRAVENOUS

## 2011-07-04 NOTE — Op Note (Signed)
[facility] [address] [place], [place]  [address]  COLONOSCOPY PROCEDURE REPORT  PATIENT:  [name], [name]  MR#:  [id number] BIRTHDATE:  [birth year], 50 yrs. old  GENDER:  female  ENDOSCOPIST:  Jonette Eva, MD REF. BY:  Lilyan Punt, M.D. ASSISTANT:  PROCEDURE DATE:  07/04/2011 PROCEDURE:  ILEOColonoscopy with biopsy  INDICATIONS:  ABDOMINAL PAIN AND DIARRHEA  MEDICATIONS:   Demerol 100 mg IV, Versed 5 mg IV  DESCRIPTION OF PROCEDURE:    Physical exam was performed. Informed consent was obtained from the patient after explaining the benefits, risks, and alternatives to procedure.  The patient was connected to monitor and placed in left lateral position. Continuous oxygen was provided by nasal cannula and IV medicine administered through an indwelling cannula.  After administration of sedation and rectal exam, the patient's rectum was intubated and the EC-3890Li ([id number]) colonoscope was advanced under direct visualization to the ILEUM  The scope was removed slowly by carefully examining the color, texture, anatomy, and integrity mucosa on the way out.  The patient was recovered in endoscopy and discharged home in satisfactory condition. <<PROCEDUREIMAGES>>  FINDINGS:  A 4 MM sessile polyp was found in the mid transverse colon & REMOVED VIA COLD FORCEPS.  FREQUENT Diverticula were found in the sigmoid to descending colon segments.  SMALL Internal Hemorrhoids were found. NL ILEUM  10-15 CM VISUALIZED.  PREP QUALITY: EXCELLENT CECAL W/D TIME:    12 minutes  COMPLICATIONS:    None  ENDOSCOPIC IMPRESSION: 1) Sessile polyp in the mid transverse colon 2) MODERATE DiverticulOSIS in the sigmoid to descending colon segments 3) Internal hemorrhoids  RECOMMENDATIONS: TCS IN 10 YEARS AWAIT BIOPSIES HIGH FIBER DIET CONTINUE ALIGN  REPEAT EXAM:  No  ______________________________ Jonette Eva, MD  CC:  Lilyan Punt, M.D.  n. eSIGNED:   Anhad Sheeley at  07/04/2011 03:18 PM  [place] Spaniel, [phone]

## 2011-07-04 NOTE — Interval H&P Note (Signed)
History and Physical Interval Note:  07/04/2011 2:32 PM  [name]  has presented today for surgery, with the diagnosis of ABD PAIN  The various methods of treatment have been discussed with the patient and family. After consideration of risks, benefits and other options for treatment, the patient has consented to  Procedure(s): COLONOSCOPY WITH ESOPHAGOGASTRODUODENOSCOPY (EGD) as a surgical intervention .  The patients' history has been reviewed, patient examined, no change in status, stable for surgery.  I have reviewed the patients' chart and labs.  Questions were answered to the patient's satisfaction.     [employer]

## 2011-07-04 NOTE — H&P (View-Only) (Signed)
Subjective:    Patient ID: [name], female    DOB: 1960-11-09, 51 y.o.   MRN: [id number]  PCP: Gerda Diss  HPI  REG bm PATTERN: 1-2X/DAY SOLID. NO PMHx OF IBS.  Sx since NOV 22 with diarrhea AFTER HOMEMADE STEW & OLD MEAT SANDWICH. No fever, chills, or stool studies. Nauseous all the times sine NOV but no vomiting. Use ASA daily. No BC, GOODY'S, IBUPROFEN, MOTRIN, OR ALEVE. NO ETOH. NO H. PYLORI TEST OR INFECTION. No problems swallowing. Sx same except Bms: soft. BMs: 3 times this AM-soft. No blood in her stool, but saw mucous. PROTONIX SINCE 2008 FOR REFLUX. nO WEIGHT GAIN. WEIGHT LOSS: 5 BS SINCE SEP ON PURPOSE.   HAS HAD epigastric, luq, llq PAIN AFTER the diarrhea started. Pain the same. Pain off and on. Triggers: if she eats then she has pain. Well water. No NH or hospital visits. No CDIff contact. Abx: NOV 2012-IT STOPPED THE DIARRHEA AND WHEN SHE GOT DONE STILL HAVING FREQUENT BMs. nO CHANGE IN STRESS OR DIET. nO MILK, ICE CREAM, rare: cheese-20 pieces if she's having eggs or a cheeseburger. No new meds or OTC supplements, teas, or herbs EXCEPT SEAWEED. NO PROBIOTICS.  Past Medical History  Diagnosis Date  . Hypertension   . Gastroesophageal reflux disease    Past Surgical History  Procedure Date  . Tubal ligation   . Cholecystectomy   . Retrograde ureteral stone extraction   . Total abdominal hysterectomy with probable salpingo-oophorectomy 2006-8    HEAVY MENSES/? CANCER    Allergies  Allergen Reactions  . Metronidazole Hives    Current Outpatient Prescriptions  Medication Sig Dispense Refill  . aspirin 81 MG tablet Take 160 mg by mouth daily.      . calcium carbonate (OS-CAL) 600 MG TABS Take 600 mg by mouth 2 (two) times daily with a meal.      . Cholecalciferol (VITAMIN D-3 PO) Take 400 Units by mouth daily.      . Enalapril-Hydrochlorothiazide 5-12.5 MG per tablet Take 1 tablet by mouth daily.      Marland Kitchen estrogens, conjugated, (PREMARIN) 0.625 MG tablet Take 0.625  mg by mouth daily. Take daily for 21 days then do not take for 7 days.      . fish oil-omega-3 fatty acids 1000 MG capsule Take 2 g by mouth daily.      . Multiple Vitamin (MULTIVITAMIN) capsule Take 1 capsule by mouth daily.      . pantoprazole (PROTONIX) 40 MG tablet Take 40 mg by mouth daily.          Review of Systems  Unable to perform ROS      Objective:   Physical Exam  Vitals reviewed. Constitutional: She is oriented to person, place, and time. She appears well-developed and well-nourished. No distress.  HENT:  Head: Normocephalic and atraumatic.  Eyes: Pupils are equal, round, and reactive to light. No scleral icterus.  Neck: Normal range of motion. Neck supple.  Cardiovascular: Normal rate and regular rhythm.   Pulmonary/Chest: Effort normal and breath sounds normal. No respiratory distress.  Abdominal: Soft. Bowel sounds are normal. She exhibits no distension. There is tenderness (MILD TTP IN LLQ). There is no rebound and no guarding.  Musculoskeletal: Normal range of motion. She exhibits no edema.  Lymphadenopathy:    She has no cervical adenopathy.  Neurological: She is alert and oriented to person, place, and time.       NO FOCAL DEFICITS   Skin: No rash noted.  Psychiatric: She has a normal mood and affect.          Assessment & Plan:

## 2011-07-05 NOTE — Op Note (Signed)
[facility] [address] [place], [place]  [address]  ENDOSCOPY PROCEDURE REPORT  PATIENT:  [name], [name]  MR#:  [id number] BIRTHDATE:  1961-01-02, 50 yrs. old  GENDER:  female  ENDOSCOPIST:  Jonette Eva, MD Referred by:  Lilyan Punt, M.D.  PROCEDURE DATE:  07/04/2011 PROCEDURE:  EGD with biopsy, 43239 ASA CLASS: INDICATIONS:  ABDOMINAL PAIN & DIARRHEA  MEDICATIONS:   TCS +Versed 1 mg IV TOPICAL ANESTHETIC:  Cetacaine Spray  DESCRIPTION OF PROCEDURE:     Physical exam was performed. Informed consent was obtained from the patient after explaining the benefits, risks, and alternatives to the procedure.  The patient was connected to the monitor and placed in the left lateral position.  Continuous oxygen was provided by nasal cannula and IV medicine administered through an indwelling cannula.  After administration of sedation, the patient's esophagus was intubated and the EC-3890Li ([id number]) and [id number] ([id number]) endoscope was advanced under direct visualization to the second portion of the duodenum.  The scope was removed slowly by carefully examining the color, texture, anatomy, and integrity of the mucosa on the way out.  The patient was recovered in endoscopy and discharged home in satisfactory condition. <<PROCEDUREIMAGES>>  Mild gastritis was found & BIOPSIED VIA COLD FORCEPS. NL ESOPHAGUS. NL DUODENUM-BIOPSIES OTAINED VIA COLD FORCEPS TO EVALUATE FOR CELIAC SPRUE.  COMPLICATIONS:    None  ENDOSCOPIC IMPRESSION: 1) Mild gastritis 2O TO ASA USE  RECOMMENDATIONS: CONTINUE ALIGN AND AVOID DAIRY AWAIT BIOPSIES PROTONIX QD OPV IN 3 MOS. CONSIDER CT IF ABDOMINAL PAIN CONTINUES  REPEAT EXAM:  No  ______________________________ Jonette Eva, MD  CC:  n. eSIGNED:   Enaya Howze at 07/04/2011 03:48 PM  [place] Spaniel, [phone]

## 2011-07-06 ENCOUNTER — Telehealth: Payer: Self-pay | Admitting: Gastroenterology

## 2011-07-06 NOTE — Telephone Encounter (Signed)
Please call pt. She had simple adenomas removed from her colon. Her RANDOM colon and small bowel biopsies are normal. HER SYMPTOMS ARE MOST LIKELY DUE POST-INFECTIOUS IBS. CONTINUE ALIGN AND LOW DAIRY DIET. OPV IN 3 MOS. TCS in 10 years.

## 2011-07-06 NOTE — Telephone Encounter (Signed)
Pt called back and was informed of results and FMLA  papers at front for pick up.

## 2011-07-06 NOTE — Telephone Encounter (Signed)
Please see phone note dated 07/06/2011. Pt was informed of results of colonoscopy. She is aware the FMLA papers are ready and at the front for pick-up.

## 2011-07-06 NOTE — Telephone Encounter (Signed)
Called and left message to call on home and mobile numbers. Have the report on procedure and FMLA paper work that she needs to come by and pick up. She needs to fill out some of the info.

## 2011-07-19 NOTE — Telephone Encounter (Signed)
Reminder in epic to follow up in 3 months and tcs in 10 years 

## 2011-09-19 ENCOUNTER — Encounter: Payer: Self-pay | Admitting: Gastroenterology

## 2011-10-31 ENCOUNTER — Encounter: Payer: Self-pay | Admitting: Gastroenterology

## 2011-11-01 ENCOUNTER — Encounter: Payer: Self-pay | Admitting: Gastroenterology

## 2011-11-01 ENCOUNTER — Ambulatory Visit (INDEPENDENT_AMBULATORY_CARE_PROVIDER_SITE_OTHER): Payer: PRIVATE HEALTH INSURANCE | Admitting: Gastroenterology

## 2011-11-01 VITALS — BP 109/69 | HR 63 | Temp 98.2°F | Ht 66.0 in | Wt 166.2 lb

## 2011-11-01 DIAGNOSIS — R1013 Epigastric pain: Secondary | ICD-10-CM | POA: Insufficient documentation

## 2011-11-01 DIAGNOSIS — R634 Abnormal weight loss: Secondary | ICD-10-CM

## 2011-11-01 NOTE — Progress Notes (Signed)
Faxed to PCP

## 2011-11-01 NOTE — Assessment & Plan Note (Addendum)
ASSOCIATED WITH UNINTENIONAL WEIGHT LOSS.  CONTINUE ALIGN AND OMEPRAZOLE. TRY GINGER TEA. USE DICYCLOMNIE SPARINGLY. DRINK WATER. ADD FIBER. AVOID BLOATING & GAS. CT SCAN. OPV IN 4 MOS. CONSIDER HBT FOR SIBO IF SX NOT IMPROVED.

## 2011-11-01 NOTE — Patient Instructions (Signed)
COMPLETE CT SCAN.  CONTINUE ALIGN AND OMEPRAZOLE DAILY.  USE DICYCLOMINE AS NEEDED. REMEMBER IT MAY CAUSE CONSTIPATION.  DRINK WATER TO KEEP URINE LIGHT YELLOW.  FOLLOW A HIGH FIBER DIET. AVOID ITEMS THAT CAUSE BLAOTING AND GAS. SEE INFO BELOW.  FOLLOW UP IN 4 MOS. IF PAIN CONTINUES, WOULD RECOMMEND HYDROGEN BREATH TEST TO EVALUATE FOR SMALL BOWEL BACTERIAL OVERGROWTH.    Constipation in Adults Constipation is having stools that are hard. As we grow older, constipation is more common. If you try to fix constipation with laxatives, the problem may get worse. This is because laxatives taken over a long period of time make the colon muscles weaker. A low-fiber diet, not taking in enough fluids, and taking some medicines may make these problems worse.  HOME CARE INSTRUCTIONS  Constipation is usually best cared for without medicines. Increasing dietary fiber and eating more fruits and vegetables is the best way to manage constipation.   Slowly increase fiber intake to 25 to 38 grams per day. Whole grains, fruits, vegetables, and legumes are good sources of fiber. A dietitian can further help you incorporate high-fiber foods into your diet.   Drink enough water and fluids to keep your urine clear or pale yellow.   A fiber supplement may be added to your diet if you cannot get enough fiber from foods.   Increasing your activities also helps improve regularity.    High-Fiber Diet A high-fiber diet changes your normal diet to include more whole grains, legumes, fruits, and vegetables. Changes in the diet involve replacing refined carbohydrates with unrefined foods. The calorie level of the diet is essentially unchanged. The Dietary Reference Intake (recommended amount) for adult males is 38 grams per day. For adult females, it is 25 grams per day. Pregnant and lactating women should consume 28 grams of fiber per day. Fiber is the intact part of a plant that is not broken down during digestion.  Functional fiber is fiber that has been isolated from the plant to provide a beneficial effect in the body.  PURPOSE  Increase stool bulk.   Ease and regulate bowel movements.   Lower cholesterol.  INDICATIONS THAT YOU NEED MORE FIBER  Constipation and hemorrhoids.   Uncomplicated diverticulosis (intestine condition) and irritable bowel syndrome.   Weight management.   As a protective measure against hardening of the arteries (atherosclerosis), diabetes, and cancer.   GUIDELINES FOR INCREASING FIBER IN THE DIET  Start adding fiber to the diet slowly. A gradual increase of about 5 more grams (2 slices of whole-wheat bread, 2 servings of most fruits or vegetables, or 1 bowl of high-fiber cereal) per day is best. Too rapid an increase in fiber may result in constipation, flatulence, and bloating.   Drink enough water and fluids to keep your urine clear or pale yellow. Water, juice, or caffeine-free drinks are recommended. Not drinking enough fluid may cause constipation.   Eat a variety of high-fiber foods rather than one type of fiber.   Try to increase your intake of fiber through using high-fiber foods rather than fiber pills or supplements that contain small amounts of fiber.   The goal is to change the types of food eaten. Do not supplement your present diet with high-fiber foods, but replace foods in your present diet.  INCLUDE A VARIETY OF FIBER SOURCES  Replace refined and processed grains with whole grains, canned fruits with fresh fruits, and incorporate other fiber sources. White rice, white breads, and most bakery goods contain little or  no fiber.   Brown whole-grain rice, buckwheat oats, and many fruits and vegetables are all good sources of fiber. These include: broccoli, Brussels sprouts, cabbage, cauliflower, beets, sweet potatoes, white potatoes (skin on), carrots, tomatoes, eggplant, squash, berries, fresh fruits, and dried fruits.   Cereals appear to be the richest  source of fiber. Cereal fiber is found in whole grains and bran. Bran is the fiber-rich outer coat of cereal grain, which is largely removed in refining. In whole-grain cereals, the bran remains. In breakfast cereals, the largest amount of fiber is found in those with "bran" in their names. The fiber content is sometimes indicated on the label.   You may need to include additional fruits and vegetables each day.   In baking, for 1 cup white flour, you may use the following substitutions:   1 cup whole-wheat flour minus 2 tablespoons.   1/2 cup white flour plus 1/2 cup whole-wheat flour.

## 2011-11-01 NOTE — Progress Notes (Signed)
  Subjective:    Patient ID: [name], female    DOB: 10/03/1960, 51 y.o.   MRN: [id number]  PCP: Gerda Diss  HPI ABDOMINAL PAIN Happening only after eating. Eating oatmeAl doesn't bother her. Anything else troubles her. No changes in medicine except switch for BP pills & nausea. PROTONIX  Helped her burp more & she felt better. Pain less frequent. Feels bloated. No daily free for one week & felt constipated. BM: 2x/day: nl & hard balls. Doesn't feel like OMP works for the pain. Appetite: down. Lost 5 lbs since last visit-not trying. Took one this am because she felt sick  & it helps. Got better with one dose. PAIN NOT RESOLVED.  Past Medical History  Diagnosis Date  . Hypertension   . Gastroesophageal reflux disease     Past Surgical History  Procedure Date  . Tubal ligation   . Cholecystectomy   . Retrograde ureteral stone extraction   . Total abdominal hysterectomy with probable salpingo-oophorectomy 2006-8    HEAVY MENSES/? CANCER  . Colonoscopy 07/04/2011    sessile polyp in the mid transverse colon/moderate diverticulosis in the sigmoid to descending colon segments/internal hemorrhoids   Allergies  Allergen Reactions  . Metronidazole Hives    Current Outpatient Prescriptions  Medication Sig Dispense Refill  . calcium carbonate (OS-CAL) 600 MG TABS Take 600 mg by mouth 2 (two) times daily with a meal.      . Cholecalciferol (VITAMIN D-3 PO) Take 400 Units by mouth daily.      Marland Kitchen dicyclomine (BENTYL) 10 MG capsule Take 10 mg by mouth 4 (four) times daily -  before meals and at bedtime. As needed.      . Enalapril-Hydrochlorothiazide 5-12.5 MG per tablet Take 1 tablet by mouth daily.      Marland Kitchen estrogens, conjugated, (PREMARIN) 0.625 MG tablet Take 0.625 mg by mouth daily. Take daily for 21 days then do not take for 7 days.      . fish oil-omega-3 fatty acids 1000 MG capsule Take 2 g by mouth daily.      . Multiple Vitamin (MULTIVITAMIN) capsule Take 1 capsule by mouth daily.        Marland Kitchen omeprazole (PRILOSEC) 20 MG capsule Take 20 mg by mouth daily.      . Probiotic Product (ALIGN) 4 MG CAPS Take by mouth daily.           Review of Systems     Objective:   Physical Exam  Constitutional: She is oriented to person, place, and time. She appears well-developed. No distress.  HENT:  Head: Normocephalic and atraumatic.  Mouth/Throat: Oropharynx is clear and moist. No oropharyngeal exudate.  Eyes: Pupils are equal, round, and reactive to light. No scleral icterus.  Neck: Normal range of motion. Neck supple.  Cardiovascular: Normal rate, regular rhythm and normal heart sounds.   Pulmonary/Chest: Effort normal and breath sounds normal. No respiratory distress.  Abdominal: Soft. Bowel sounds are normal. She exhibits no distension. There is tenderness (mild to mod ttp in epigastrium). There is no rebound and no guarding.  Musculoskeletal: Normal range of motion. She exhibits no edema.  Lymphadenopathy:    She has no cervical adenopathy.  Neurological: She is alert and oriented to person, place, and time.       NO FOCAL DEFICITS   Psychiatric: She has a normal mood and affect.          Assessment & Plan:

## 2011-11-02 NOTE — Progress Notes (Signed)
Reminder in epic to follow up in 4 months with SF ONLY in E30 °

## 2011-11-03 ENCOUNTER — Ambulatory Visit (HOSPITAL_COMMUNITY)
Admission: RE | Admit: 2011-11-03 | Discharge: 2011-11-03 | Disposition: A | Discharge status: Home / Self Care | Payer: PRIVATE HEALTH INSURANCE | Source: Ambulatory Visit | Attending: Gastroenterology | Admitting: Gastroenterology

## 2011-11-03 DIAGNOSIS — R634 Abnormal weight loss: Secondary | ICD-10-CM

## 2011-11-03 DIAGNOSIS — R110 Nausea: Secondary | ICD-10-CM | POA: Insufficient documentation

## 2011-11-03 DIAGNOSIS — K5730 Diverticulosis of large intestine without perforation or abscess without bleeding: Secondary | ICD-10-CM | POA: Insufficient documentation

## 2011-11-03 DIAGNOSIS — R1013 Epigastric pain: Secondary | ICD-10-CM | POA: Insufficient documentation

## 2011-11-03 MED ORDER — IOHEXOL 300 MG/ML  SOLN
100.0000 mL | Freq: Once | INTRAMUSCULAR | Status: AC | PRN
  Administered 2011-11-03: 100 mL via INTRAVENOUS

## 2011-11-08 NOTE — Progress Notes (Addendum)
PLEASE CALL PT. HER CT ABD/PELVIS SHOWED DIVERTICULOSIS. IT DID NOT SHOW CANCER OR INFECTION. 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    CONTINUE ALIGN AND OMEPRAZOLE DAILY.  USE DICYCLOMINE AS NEEDED. REMEMBER IT MAY CAUSE CONSTIPATION.  DRINK WATER TO KEEP URINE LIGHT YELLOW.  FOLLOW A HIGH FIBER DIET. AVOID ITEMS THAT CAUSE BLAOTING AND GAS. Marland Kitchen  FOLLOW UP IN 4 MOS.

## 2011-11-08 NOTE — Progress Notes (Signed)
LMOM to call.

## 2011-11-09 NOTE — Progress Notes (Signed)
Returned pt's call at work [phone]) and informed of results.

## 2012-03-06 ENCOUNTER — Encounter: Payer: Self-pay | Admitting: Gastroenterology

## 2012-08-28 ENCOUNTER — Other Ambulatory Visit: Payer: Self-pay | Admitting: Family Medicine

## 2012-08-28 DIAGNOSIS — Z139 Encounter for screening, unspecified: Secondary | ICD-10-CM

## 2012-09-03 ENCOUNTER — Ambulatory Visit (HOSPITAL_COMMUNITY): Payer: PRIVATE HEALTH INSURANCE

## 2012-09-10 ENCOUNTER — Ambulatory Visit (HOSPITAL_COMMUNITY)
Admission: RE | Admit: 2012-09-10 | Discharge: 2012-09-10 | Disposition: A | Discharge status: Home / Self Care | Payer: PRIVATE HEALTH INSURANCE | Source: Ambulatory Visit | Attending: Family Medicine | Admitting: Family Medicine

## 2012-09-10 DIAGNOSIS — Z1231 Encounter for screening mammogram for malignant neoplasm of breast: Secondary | ICD-10-CM | POA: Insufficient documentation

## 2012-09-10 DIAGNOSIS — Z139 Encounter for screening, unspecified: Secondary | ICD-10-CM

## 2012-09-13 ENCOUNTER — Other Ambulatory Visit: Payer: Self-pay | Admitting: Family Medicine

## 2012-09-13 DIAGNOSIS — R928 Other abnormal and inconclusive findings on diagnostic imaging of breast: Secondary | ICD-10-CM

## 2012-09-19 ENCOUNTER — Other Ambulatory Visit: Payer: Self-pay | Admitting: Family Medicine

## 2012-09-19 DIAGNOSIS — R928 Other abnormal and inconclusive findings on diagnostic imaging of breast: Secondary | ICD-10-CM

## 2012-10-03 ENCOUNTER — Ambulatory Visit
Admission: RE | Admit: 2012-10-03 | Discharge: 2012-10-03 | Disposition: A | Discharge status: Home / Self Care | Payer: PRIVATE HEALTH INSURANCE | Source: Ambulatory Visit | Attending: Family Medicine | Admitting: Family Medicine

## 2012-10-03 ENCOUNTER — Other Ambulatory Visit: Payer: Self-pay | Admitting: Family Medicine

## 2012-10-03 DIAGNOSIS — R928 Other abnormal and inconclusive findings on diagnostic imaging of breast: Secondary | ICD-10-CM

## 2012-10-09 ENCOUNTER — Encounter (HOSPITAL_COMMUNITY): Payer: PRIVATE HEALTH INSURANCE

## 2012-10-25 ENCOUNTER — Ambulatory Visit (INDEPENDENT_AMBULATORY_CARE_PROVIDER_SITE_OTHER): Payer: PRIVATE HEALTH INSURANCE | Admitting: Family Medicine

## 2012-10-25 ENCOUNTER — Encounter: Payer: Self-pay | Admitting: Family Medicine

## 2012-10-25 VITALS — BP 146/110 | HR 80 | Wt 172.0 lb

## 2012-10-25 DIAGNOSIS — I10 Essential (primary) hypertension: Secondary | ICD-10-CM | POA: Insufficient documentation

## 2012-10-25 MED ORDER — ENALAPRIL-HYDROCHLOROTHIAZIDE 5-12.5 MG PO TABS: 1.0000 | ORAL_TABLET | Freq: Every day | ORAL | Status: DC

## 2012-10-25 MED ORDER — ZOLPIDEM TARTRATE 5 MG PO TABS: 5.0000 mg | ORAL_TABLET | Freq: Every evening | ORAL | Status: DC | PRN

## 2012-10-25 NOTE — Progress Notes (Signed)
  Subjective:    Patient ID: [name], female    DOB: May 11, 1961, 52 y.o.   MRN: [id number]  HPI Patient has noted over the past week increased swelling in her legs. She denies chest tightness pressure pain shortness breath. She does state she take her blood pressure medicine. She does not check her blood pressure outside the office. She has history hypertension Family history noncontributory Social she does not smoke  Review of SystemsSee above denies blurred vision headaches. Denies excessive salt use. Does not exercise much.     Objective:   Physical Exam Vital signs stable except blood pressure is elevated on several readings. Eardrums normal throat is normal neck supple no masses lungs are clear no crackles heart regular she does have 1-2 edema in the feet       Assessment & Plan:  #1 pedal edema-add diuretic blood pressure medicine #2 hypertension uncontrolled Vasoretic 5 mg/12.5 mg 1 every morning. Check metabolic 7 level in 2-3 weeks. If not seen significant reduction in edema let us know also if blood pressures stay in the elevated range let us know otherwise followup in 3-4 months

## 2012-10-25 NOTE — Patient Instructions (Signed)
You can go ahead and stop taking your current blood pressure medicine. Go ahead and get the knee prescription from [facility]. To take one each morning over the next week the swelling in your feet should get better. I also want you do your blood work in approximately 3 weeks bring the papers with you when you get your blood drawn.  Ambien 5 mg can be used at nighttime when necessary to help him sleep but remembered to devote a full nights rest when you take the medication. You should followup with [place] somewhere by later in the fall sooner if any problems

## 2012-10-30 ENCOUNTER — Encounter: Payer: Self-pay | Admitting: *Deleted

## 2012-11-18 ENCOUNTER — Encounter: Payer: Self-pay | Admitting: Family Medicine

## 2012-11-18 ENCOUNTER — Ambulatory Visit (INDEPENDENT_AMBULATORY_CARE_PROVIDER_SITE_OTHER): Payer: PRIVATE HEALTH INSURANCE | Admitting: Family Medicine

## 2012-11-18 VITALS — BP 140/70 | Temp 98.2°F | Wt 169.2 lb

## 2012-11-18 DIAGNOSIS — I10 Essential (primary) hypertension: Secondary | ICD-10-CM

## 2012-11-18 MED ORDER — HYDROCHLOROTHIAZIDE 12.5 MG PO CAPS: 12.5000 mg | ORAL_CAPSULE | Freq: Every day | ORAL | Status: DC

## 2012-11-18 NOTE — Patient Instructions (Signed)
As you lose weight you may not have to take HCTZ daily  Goal 130/80 or less

## 2012-11-18 NOTE — Progress Notes (Signed)
  Subjective:    Patient ID: [name], female    DOB: 1961/05/04, 52 y.o.   MRN: [id number]  HPI dizziness and lightheadedness for 2 weeks. Began 2 weeks ago when started enalapril-hctz 5-12.5 QD.  States lightheadedness is continuous throughout the day.   She states she feels drained all the time intermittent dizziness but fatigue and lightheaded the majority of the day. Denies any dyspnea and denies chest pressure or pain PMH benign HTN social-Does not smoke   Review of Systems See above.    Objective:   Physical Exam Blood pressure 120/72 lungs are clear heart regular no murmurs pulse normal extremities no edema skin warm dry       Assessment & Plan:  HTN-I. Don't feel this patient needs as much medication if she is on I recommend that we stop the enalapril- HCTZ. In its place HCTZ 12.5 mg one every morning she states she will consume products that have potassium she will followup if not improving over the course of next couple weeks otherwise see back within 3 months to recheck blood pressure. She is going try to exercise and lose weight.

## 2012-12-18 ENCOUNTER — Telehealth: Payer: Self-pay | Admitting: Family Medicine

## 2012-12-18 NOTE — Telephone Encounter (Signed)
nother appt, not this wk

## 2012-12-18 NOTE — Telephone Encounter (Signed)
Patient states that BP meds Dr Lorin Picket put her on first of June gives her a headache so she doesn't take them because they take too much out of her. She would like to know if she needs to make another appointment or if we can just change meds.

## 2012-12-18 NOTE — Telephone Encounter (Signed)
Patient scheduled follow up office visit for next week.

## 2012-12-24 ENCOUNTER — Ambulatory Visit (INDEPENDENT_AMBULATORY_CARE_PROVIDER_SITE_OTHER): Payer: PRIVATE HEALTH INSURANCE | Admitting: Family Medicine

## 2012-12-24 ENCOUNTER — Encounter: Payer: Self-pay | Admitting: Family Medicine

## 2012-12-24 VITALS — BP 128/82 | HR 70 | Wt 174.0 lb

## 2012-12-24 DIAGNOSIS — R51 Headache: Secondary | ICD-10-CM

## 2012-12-24 NOTE — Progress Notes (Signed)
  Subjective:    Patient ID: [name], female    DOB: 1960/09/12, 52 y.o.   MRN: [id number]  HPIpatient stopped taking blood pressure that was prescribed at last visit because it was causing headache and making her feel drained. She states since stopping the medicine she now feels well and she has not checked her blood pressure as an outpatient. She denies any headaches nausea vomiting or double vision she has a history of recently elevated blood pressure she does state she is trying to be better with her diet but she is not exercising much. Family history social history reviewed.   Review of Systems See above.    Objective:   Physical Exam Vital signs recheck 134/82. Neck no masses lungs are clear no crackles heart regular pulse normal extremities no edema       Assessment & Plan:  HTN- resolved for now, self monitor, recheck after next employee wellness blood draw if her blood pressures are elevated as an outpatient she is to let us know. Otherwise we'll see her back in several months.

## 2012-12-24 NOTE — Patient Instructions (Signed)
DASH Diet  The DASH diet stands for "Dietary Approaches to Stop Hypertension." It is a healthy eating plan that has been shown to reduce high blood pressure (hypertension) in as little as 14 days, while also possibly providing other significant health benefits. These other health benefits include reducing the risk of breast cancer after menopause and reducing the risk of type 2 diabetes, heart disease, colon cancer, and stroke. Health benefits also include weight loss and slowing kidney failure in patients with chronic kidney disease.   DIET GUIDELINES  · Limit salt (sodium). Your diet should contain less than 1500 mg of sodium daily.  · Limit refined or processed carbohydrates. Your diet should include mostly whole grains. Desserts and added sugars should be used sparingly.  · Include small amounts of heart-healthy fats. These types of fats include nuts, oils, and tub margarine. Limit saturated and trans fats. These fats have been shown to be harmful in the body.  CHOOSING FOODS   The following food groups are based on a 2000 calorie diet. See your Registered Dietitian for individual calorie needs.  Grains and Grain Products (6 to 8 servings daily)  · Eat More Often: Whole-wheat bread, brown rice, whole-grain or wheat pasta, quinoa, popcorn without added fat or salt (air popped).  · Eat Less Often: White bread, white pasta, white rice, cornbread.  Vegetables (4 to 5 servings daily)  · Eat More Often: Fresh, frozen, and canned vegetables. Vegetables may be raw, steamed, roasted, or grilled with a minimal amount of fat.  · Eat Less Often/Avoid: Creamed or fried vegetables. Vegetables in a cheese sauce.  Fruit (4 to 5 servings daily)  · Eat More Often: All fresh, canned (in natural juice), or frozen fruits. Dried fruits without added sugar. One hundred percent fruit juice (½ cup [237 mL] daily).  · Eat Less Often: Dried fruits with added sugar. Canned fruit in light or heavy syrup.  Lean Meats, Fish, and Poultry (2  servings or less daily. One serving is 3 to 4 oz [85-114 g]).  · Eat More Often: Ninety percent or leaner ground beef, tenderloin, sirloin. Round cuts of beef, chicken breast, turkey breast. All fish. Grill, bake, or broil your meat. Nothing should be fried.  · Eat Less Often/Avoid: Fatty cuts of meat, turkey, or chicken leg, thigh, or wing. Fried cuts of meat or fish.  Dairy (2 to 3 servings)  · Eat More Often: Low-fat or fat-free milk, low-fat plain or light yogurt, reduced-fat or part-skim cheese.  · Eat Less Often/Avoid: Milk (whole, 2%). Whole milk yogurt. Full-fat cheeses.  Nuts, Seeds, and Legumes (4 to 5 servings per week)  · Eat More Often: All without added salt.  · Eat Less Often/Avoid: Salted nuts and seeds, canned beans with added salt.  Fats and Sweets (limited)  · Eat More Often: Vegetable oils, tub margarines without trans fats, sugar-free gelatin. Mayonnaise and salad dressings.  · Eat Less Often/Avoid: Coconut oils, palm oils, butter, stick margarine, cream, half and half, cookies, candy, pie.  FOR MORE INFORMATION  The Dash Diet Eating Plan: www.dashdiet.org  Document Released: 05/25/2011 Document Revised: 08/28/2011 Document Reviewed: 05/25/2011  Exit[employer]® Patient Information ©2014 [employer].

## 2012-12-26 ENCOUNTER — Other Ambulatory Visit: Payer: Self-pay | Admitting: *Deleted

## 2012-12-26 ENCOUNTER — Telehealth: Payer: Self-pay | Admitting: Family Medicine

## 2012-12-26 ENCOUNTER — Other Ambulatory Visit: Payer: Self-pay | Admitting: Family Medicine

## 2012-12-26 MED ORDER — POTASSIUM CHLORIDE ER 10 MEQ PO TBCR: EXTENDED_RELEASE_TABLET | ORAL | Status: DC

## 2012-12-26 MED ORDER — FUROSEMIDE 20 MG PO TABS: ORAL_TABLET | ORAL | Status: DC

## 2012-12-26 NOTE — Telephone Encounter (Signed)
Discussed with pt. prescritions sent to [facility]

## 2012-12-26 NOTE — Telephone Encounter (Signed)
Pt calling stating that by the end of the day her lower leg/ankle are swollen since you took her off her BP meds on Tuesday this week. Been off it now about an week an half. Pharmacist did not recommend her to use OTC water pills. What do you advise.

## 2012-12-26 NOTE — Telephone Encounter (Signed)
Lasix 20 mg ( use 1/2 qam to 1 each am ) prn qd -try 1/2 first, #30 3 refills,  Also KCL 10 meq, 1 qd #30 3 refills F/u withn 3 to 4 months

## 2013-01-16 ENCOUNTER — Other Ambulatory Visit: Payer: Self-pay | Admitting: Nurse Practitioner

## 2013-01-16 ENCOUNTER — Ambulatory Visit (INDEPENDENT_AMBULATORY_CARE_PROVIDER_SITE_OTHER): Payer: PRIVATE HEALTH INSURANCE | Admitting: Nurse Practitioner

## 2013-01-16 ENCOUNTER — Encounter: Payer: Self-pay | Admitting: Nurse Practitioner

## 2013-01-16 ENCOUNTER — Encounter: Payer: Self-pay | Admitting: Family Medicine

## 2013-01-16 VITALS — BP 138/88 | HR 80 | Wt 174.6 lb

## 2013-01-16 DIAGNOSIS — Z79899 Other long term (current) drug therapy: Secondary | ICD-10-CM

## 2013-01-16 DIAGNOSIS — S29012A Strain of muscle and tendon of back wall of thorax, initial encounter: Secondary | ICD-10-CM

## 2013-01-16 DIAGNOSIS — M62838 Other muscle spasm: Secondary | ICD-10-CM

## 2013-01-16 DIAGNOSIS — M546 Pain in thoracic spine: Secondary | ICD-10-CM

## 2013-01-16 MED ORDER — CYCLOBENZAPRINE HCL 10 MG PO TABS: 10.0000 mg | ORAL_TABLET | Freq: Three times a day (TID) | ORAL | Status: DC | PRN

## 2013-01-16 MED ORDER — NABUMETONE 500 MG PO TABS: 500.0000 mg | ORAL_TABLET | Freq: Two times a day (BID) | ORAL | Status: DC

## 2013-01-16 NOTE — Telephone Encounter (Signed)
Pt states she was supposed to get a script for a massage therapist but don't see it in her paper work. Can we write that an she will come back to get it. Thanks

## 2013-01-16 NOTE — Patient Instructions (Addendum)
Gilmore Laroche or Kat  Massage therapists  [employer] and heat applications Naphcon eye drops

## 2013-01-18 ENCOUNTER — Encounter: Payer: Self-pay | Admitting: Nurse Practitioner

## 2013-01-18 NOTE — Progress Notes (Signed)
Subjective:  Presents complaints of symptoms in both hands especially over the past month. Patient works at a [employer]. Her workload has doubled over the past month. 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 Working more hours than usual. No history of injury to the neck area. The left hand has been weak. The right hand has numbness and pain in the thumb index and middle fingers. Also weakness in the right hand. Has been wearing a brace on the right wrist which has helped increase her strength but continues to have other symptoms. Also of note patient has not been taking her potassium because she says it causes flutters in her chest. Continues to take daily Lasix. Eating high potassium foods. Taking omeprazole for her reflux.  Objective:   BP 138/88  Pulse 80  Wt 174 lb 9.6 oz (79.198 kg)  BMI 28.19 kg/m2 NAD. Alert, oriented. Lungs clear. Heart regular rate rhythm. Mildly tight tender muscles noted in the upper back neck area on the left side. Very tender extremely tight muscles noted in the right cervical and lateral neck area extending to the trapezius and rhomboids. Can perform active ROM of the neck with tenderness with lateral movements. Good ROM of both shoulders producing pain in the upper back area. Mild tenderness towards the medial aspect of the right elbow. Good arm of both wrist with some tenderness, Phalen and Tinel's negative. Muscle strength 3-4+ bilaterally. Arm strength 4+ bilaterally. Strong radial pulses. Fingers warm with good capillary refill. Sensation grossly intact both hands. Reflexes mild hyperreflexia right arm, normal on the left.  Assessment:Muscle spasms of neck  High risk medication use - Plan: Potassium  Upper back strain, initial encounter  Plan: Meds ordered this encounter  Medications  . DISCONTD: nabumetone (RELAFEN) 500 MG tablet    Sig: Take 1 tablet (500 mg total) by mouth 2 (two)  times daily. Prn pain    Dispense:  30 tablet    Refill:  0    Order Specific Question:  Supervising Provider    Answer:  Merlyn Albert [[id number]]  . cyclobenzaprine (FLEXERIL) 10 MG tablet    Sig: Take 1 tablet (10 mg total) by mouth 3 (three) times daily as needed for muscle spasms.    Dispense:  30 tablet    Refill:  0    Order Specific Question:  Supervising Provider    Answer:  Merlyn Albert [[id number]]   Take Relafen sparingly. DC if any acid reflux or stomach upset. Take with food. Drowsiness precautions for Flexeril. Ice/heat applications to the neck and upper back area. Recommend massage therapy and possibly adjustments with chiropractor. Recheck in 7-10 days if no improvement in her current symptoms. Continue wrist brace if it helps.

## 2013-01-28 ENCOUNTER — Telehealth: Payer: Self-pay | Admitting: Nurse Practitioner

## 2013-01-28 NOTE — Telephone Encounter (Signed)
Pt would like go ahead with the MRI her back is not getting any better.

## 2013-01-29 ENCOUNTER — Telehealth: Payer: Self-pay | Admitting: Family Medicine

## 2013-01-29 NOTE — Telephone Encounter (Signed)
NTC-please try to get information on what is going on with the patient and how we can help her. Please describe where the symptoms are what she is trying for it in the last time she was here with [place] [facility] what was mentioned. I will be happy to try to help now. I am out of the office this afternoon Thursday and Friday. I will try to check back on the messages in the morning. I tried calling the phone number listed and it came back his been disconnected. 1:05 PM Wednesday thank you

## 2013-01-29 NOTE — Telephone Encounter (Signed)
Patient states that she came in and seen [facility] for pain in both arms. She is still experiencing the pain in both arms but she is now having more pain in the left index finger and thumb that radiates up the left arm into the shoulder. She was given nabumetone and cyclobenzaprine and it is not helping. Also has been going to massage therapy for treatment. She states that she is unable to lay on her left side due to the pain. She wants to know what do you recommend at this point?

## 2013-01-29 NOTE — Telephone Encounter (Signed)
Patient wants to know about any further action can be done about her wrist pain.She wants to speak with you about this.Will be at work until 5pm.you can reach her their. [phone]

## 2013-01-29 NOTE — Telephone Encounter (Signed)
[name] has already discussed this with patient.

## 2013-01-29 NOTE — Telephone Encounter (Signed)
Left message on voicemail to return call.

## 2013-01-30 ENCOUNTER — Other Ambulatory Visit: Payer: Self-pay | Admitting: Nurse Practitioner

## 2013-01-30 DIAGNOSIS — M792 Neuralgia and neuritis, unspecified: Secondary | ICD-10-CM

## 2013-01-30 DIAGNOSIS — R29898 Other symptoms and signs involving the musculoskeletal system: Secondary | ICD-10-CM

## 2013-01-31 DIAGNOSIS — Z0289 Encounter for other administrative examinations: Secondary | ICD-10-CM

## 2013-02-04 ENCOUNTER — Telehealth: Payer: Self-pay | Admitting: Family Medicine

## 2013-02-04 NOTE — Telephone Encounter (Signed)
Nurses, please schedule pt's MRI C-spine without contrast at [facility].  No precert is required by her insurance and it was put in my "Q" by Eber Jones 01/30/13

## 2013-02-05 NOTE — Telephone Encounter (Signed)
MRI C-Spine scheduled on 02/07/13 at 9 am. Patient was notified.

## 2013-02-05 NOTE — Telephone Encounter (Signed)
Left message to return call on cell #. No answer at home #.

## 2013-02-07 ENCOUNTER — Ambulatory Visit (HOSPITAL_COMMUNITY)
Admission: RE | Admit: 2013-02-07 | Discharge: 2013-02-07 | Disposition: A | Discharge status: Home / Self Care | Payer: PRIVATE HEALTH INSURANCE | Source: Ambulatory Visit | Attending: Nurse Practitioner | Admitting: Nurse Practitioner

## 2013-02-07 DIAGNOSIS — M5380 Other specified dorsopathies, site unspecified: Secondary | ICD-10-CM | POA: Insufficient documentation

## 2013-02-07 DIAGNOSIS — R209 Unspecified disturbances of skin sensation: Secondary | ICD-10-CM | POA: Insufficient documentation

## 2013-02-07 DIAGNOSIS — M5126 Other intervertebral disc displacement, lumbar region: Secondary | ICD-10-CM | POA: Insufficient documentation

## 2013-02-07 DIAGNOSIS — R29898 Other symptoms and signs involving the musculoskeletal system: Secondary | ICD-10-CM

## 2013-02-07 DIAGNOSIS — M542 Cervicalgia: Secondary | ICD-10-CM | POA: Insufficient documentation

## 2013-02-07 DIAGNOSIS — M792 Neuralgia and neuritis, unspecified: Secondary | ICD-10-CM

## 2013-02-14 ENCOUNTER — Encounter: Payer: Self-pay | Admitting: Family Medicine

## 2013-02-14 ENCOUNTER — Ambulatory Visit (INDEPENDENT_AMBULATORY_CARE_PROVIDER_SITE_OTHER): Payer: PRIVATE HEALTH INSURANCE | Admitting: Family Medicine

## 2013-02-14 VITALS — BP 170/110 | Ht 66.0 in | Wt 175.0 lb

## 2013-02-14 DIAGNOSIS — IMO0002 Reserved for concepts with insufficient information to code with codable children: Secondary | ICD-10-CM

## 2013-02-14 DIAGNOSIS — I10 Essential (primary) hypertension: Secondary | ICD-10-CM

## 2013-02-14 DIAGNOSIS — M792 Neuralgia and neuritis, unspecified: Secondary | ICD-10-CM

## 2013-02-14 NOTE — Progress Notes (Signed)
  Subjective:    Patient ID: [name], female    DOB: 03/15/1961, 51 y.o.   MRN: [id number]  HPI Patient here today to go over MRI results. I discussed the MRI results with her including the fact that it shows bulges but no herniation.  She is still in pain and having headaches. Patient relates pain in the right trapezius area goes up the back of her neck also relates pain down the arm also relates numbness in the left hand and numbness and pain in the right hand. 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 the level work is gone up dramatically over the past several months she denies any injury she denies having this problem before  She also has hypertension she relates she is taking her medication last medicine would put her on lisinopril caused too much symptoms so currently on hydrochlorothiazide    Review of Systems negative for chest pain shortness breath swelling in the legs she does wear a brace on the right hand     Objective:   Physical Exam Positive Tinel's bilateral moderate tenderness trapezius moderate tenderness in the arms lungs are clear hearts regular Blood pressure recheck is elevated best reading 160/98       Assessment & Plan:  HTN-she feels it's due to not taking her medicine today she will resume her medication she will check blood pressures with her husband's monitor she will send [place] those readings quite possibly we will have to start medicine possibly amlodipine  Trapezius pain discomfort right arm pain and discomfort along with numbness in both hands I feel that this could be carpal tunnel but there is possibility of I nerve nerve root impingement I believe she needs nerve conduction studies possibly EMG we will referral. Patient may well need referral to hand specialist after that. It is also possible that this could be Workmen's Comp. given the amount of work she does with her hands but I told the patient may be difficult to prove that she may end up  having to get a Clinical research associate.

## 2013-02-18 ENCOUNTER — Telehealth: Payer: Self-pay | Admitting: Family Medicine

## 2013-02-18 MED ORDER — AMLODIPINE BESYLATE 2.5 MG PO TABS: 2.5000 mg | ORAL_TABLET | Freq: Every day | ORAL | Status: DC

## 2013-02-18 NOTE — Telephone Encounter (Signed)
Left message to return call 

## 2013-02-18 NOTE — Addendum Note (Signed)
Addended by: Margaretha Sheffield on: 02/18/2013 05:22 PM   Modules accepted: Orders

## 2013-02-18 NOTE — Telephone Encounter (Signed)
Patient states blood pressure is still running high. Sunday 144/84. Today 138/96.

## 2013-02-18 NOTE — Telephone Encounter (Signed)
Amlodipine 2.5 mg one daily,#30 ,3 refills,follow up in 3 to 4 weeks pleadse call if problems

## 2013-02-18 NOTE — Telephone Encounter (Signed)
Rx sent electronically to [facility].

## 2013-02-19 NOTE — Telephone Encounter (Signed)
Discussed with patient. Patient has follow up office visit scheduled 02/28/13

## 2013-02-24 DIAGNOSIS — G5600 Carpal tunnel syndrome, unspecified upper limb: Secondary | ICD-10-CM | POA: Insufficient documentation

## 2013-02-28 ENCOUNTER — Encounter: Payer: Self-pay | Admitting: Family Medicine

## 2013-02-28 ENCOUNTER — Ambulatory Visit (INDEPENDENT_AMBULATORY_CARE_PROVIDER_SITE_OTHER): Payer: PRIVATE HEALTH INSURANCE | Admitting: Family Medicine

## 2013-02-28 VITALS — BP 130/86 | Ht 66.0 in | Wt 175.4 lb

## 2013-02-28 DIAGNOSIS — J019 Acute sinusitis, unspecified: Secondary | ICD-10-CM

## 2013-02-28 DIAGNOSIS — I10 Essential (primary) hypertension: Secondary | ICD-10-CM

## 2013-02-28 MED ORDER — LEVOFLOXACIN 500 MG PO TABS: 500.0000 mg | ORAL_TABLET | Freq: Every day | ORAL | Status: AC

## 2013-02-28 NOTE — Progress Notes (Signed)
  Subjective:    Patient ID: [name], female    DOB: 1961-04-05, 52 y.o.   MRN: [id number]  HPI  Patient arrives to follow up on blood pressure. Patient also having congestion and cough for 5 days.  Patient denies any problem with medication states she's taken is doing well for her. She denies any swelling. Energy level overall doing pretty good. PMH HTN Review of Systems See above.    Objective:   Physical Exam Lungs clear hearts were pulse normal blood pressure good Mild sinus tenderness eardrums normal throat      Assessment & Plan:  A/P HTN continue current medication Mild sinus infection Levaquin 10 days as directed Followup if ongoing troubles

## 2013-02-28 NOTE — Patient Instructions (Signed)
Store brand of allegra ( fexofinidine 180 mg ) one daily

## 2013-03-03 ENCOUNTER — Other Ambulatory Visit: Payer: Self-pay | Admitting: Diagnostic Neuroimaging

## 2013-03-03 ENCOUNTER — Ambulatory Visit (INDEPENDENT_AMBULATORY_CARE_PROVIDER_SITE_OTHER): Payer: PRIVATE HEALTH INSURANCE | Admitting: Diagnostic Neuroimaging

## 2013-03-03 ENCOUNTER — Encounter (INDEPENDENT_AMBULATORY_CARE_PROVIDER_SITE_OTHER): Payer: Self-pay | Admitting: Radiology

## 2013-03-03 ENCOUNTER — Other Ambulatory Visit: Payer: Self-pay | Admitting: Radiology

## 2013-03-03 DIAGNOSIS — R200 Anesthesia of skin: Secondary | ICD-10-CM

## 2013-03-03 DIAGNOSIS — R209 Unspecified disturbances of skin sensation: Secondary | ICD-10-CM

## 2013-03-03 DIAGNOSIS — G5602 Carpal tunnel syndrome, left upper limb: Secondary | ICD-10-CM

## 2013-03-03 NOTE — Procedures (Signed)
   [facility]  NCS (NERVE CONDUCTION STUDY) WITH EMG (ELECTROMYOGRAPHY) REPORT   STUDY DATE: 03/03/13 PATIENT NAME: [name] DOB: Mar 26, 1961 MRN: [id number]  ORDERING CLINICIAN: Gerda Diss  TECHNOLOGIST: Kaylyn Lim ELECTROMYOGRAPHER: Glenford Bayley. Maxwell Martorano, MD  CLINICAL INFORMATION: 52 year old female with left hand numbness (digits 1, 2) and right hand pain/numbness radiating to the right shoulder.  FINDINGS: NERVE CONDUCTION STUDY: Bilateral median and ulnar motor responses have normal distal latencies, amplitudes, conduction velocities and F-wave latencies. Bilateral median and ulnar sensory responses are normal. Bilateral median and ulnar transcarpal mixed nerve responses have normal amplitudes and conduction velocities.  NEEDLE ELECTROMYOGRAPHY: Needle examination of selected muscles of the right upper extremity (deltoid, biceps, triceps, flexor carpi radialis, first dorsal interosseous) shows no abnormal spontaneous activity at rest and normal motor unit recruitment on exertion.  IMPRESSION:  This is a normal study. No electrodiagnostic evidence of large fiber neuropathy, myopathy or cervical radiculopathy at this time.   INTERPRETING PHYSICIAN:  Suanne Marker, MD Certified in Neurology, Neurophysiology and Neuroimaging  [facility] [address], [address] [place], [place] [address] [phone]

## 2013-03-20 ENCOUNTER — Telehealth: Payer: Self-pay | Admitting: Family Medicine

## 2013-03-20 NOTE — Telephone Encounter (Signed)
Patient would like to know the results to her nerve conduction surgery

## 2013-03-20 NOTE — Telephone Encounter (Signed)
The nerve conduction tests is normal. There is always a possibility that a small amount of neuropathy he would not show one of tests like this. If patient is still having problems we could set her up for consultation with a neurologist for their opinion on whether or not any type of procedures would help. She saw a neurologist for the nerve conduction tests but they essentially did the test and did not give an opinion.

## 2013-03-21 NOTE — Telephone Encounter (Signed)
Left message on voicemail to return call.

## 2013-03-26 NOTE — Telephone Encounter (Signed)
Pt states the workman comp doctor is setting her up with a neurologist. Will call back if she needs anything from [place].

## 2013-03-26 NOTE — Telephone Encounter (Signed)
So noted 

## 2013-05-05 ENCOUNTER — Encounter: Payer: Self-pay | Admitting: Family Medicine

## 2013-05-05 ENCOUNTER — Ambulatory Visit (INDEPENDENT_AMBULATORY_CARE_PROVIDER_SITE_OTHER): Payer: PRIVATE HEALTH INSURANCE | Admitting: Family Medicine

## 2013-05-05 VITALS — BP 130/98 | Temp 99.3°F | Ht 66.0 in | Wt 178.4 lb

## 2013-05-05 DIAGNOSIS — J019 Acute sinusitis, unspecified: Secondary | ICD-10-CM

## 2013-05-05 MED ORDER — HYDROCODONE-HOMATROPINE 5-1.5 MG/5ML PO SYRP: 5.0000 mL | ORAL_SOLUTION | Freq: Four times a day (QID) | ORAL | Status: DC | PRN

## 2013-05-05 MED ORDER — LEVOFLOXACIN 500 MG PO TABS: 500.0000 mg | ORAL_TABLET | Freq: Every day | ORAL | Status: DC

## 2013-05-05 NOTE — Progress Notes (Signed)
  Subjective:    Patient ID: [name], female    DOB: 24-Dec-1960, 52 y.o.   MRN: [id number]  Sore Throat  This is a new problem. The current episode started in the past 7 days. Maximum temperature: Low grade. Associated symptoms include congestion, coughing, headaches, a hoarse voice and a plugged ear sensation. She has tried acetaminophen and NSAIDs for the symptoms. The treatment provided mild relief.   PMH benign/hypertension No wheeze, no SOB   Review of Systems  HENT: Positive for congestion and hoarse voice.   Respiratory: Positive for cough.   Neurological: Positive for headaches.       Objective:   Physical Exam  Nursing note and vitals reviewed. Constitutional: She appears well-developed.  HENT:  Head: Normocephalic.  Nose: Nose normal.  Mouth/Throat: Oropharynx is clear and moist. No oropharyngeal exudate.  Neck: Neck supple.  Cardiovascular: Normal rate and normal heart sounds.   No murmur heard. Pulmonary/Chest: Effort normal and breath sounds normal. She has no wheezes.  Lymphadenopathy:    She has no cervical adenopathy.  Skin: Skin is warm and dry.          Assessment & Plan:  Patient overall has mild URI with secondary sinusitis antibiotics prescribed warning signs discussed she was also encouraged to check blood pressure as an outpatient if she has ongoing elevated blood pressure followup sooner. Watch diet closely stay physically active.

## 2013-05-19 ENCOUNTER — Telehealth: Payer: Self-pay | Admitting: Family Medicine

## 2013-05-19 MED ORDER — AZITHROMYCIN 250 MG PO TABS: ORAL_TABLET | ORAL | Status: DC

## 2013-05-19 NOTE — Telephone Encounter (Signed)
Z-Pak. If ongoing symptoms may need followup.

## 2013-05-19 NOTE — Telephone Encounter (Signed)
Patient is still having congestion in her vocal cords and would like another round of antibiotics called in to [facility]. She said she has completed the Rx given on 05/05/13 when diagnosed with sinusitis.

## 2013-05-19 NOTE — Telephone Encounter (Signed)
Medication sent to pharmacy. Left message on voicemail notifying patient.  

## 2013-05-19 NOTE — Telephone Encounter (Signed)
Levaquin 500 mg x 10 days was prescribed on 05/05/13

## 2013-05-30 ENCOUNTER — Telehealth: Payer: Self-pay | Admitting: *Deleted

## 2013-05-30 NOTE — Telephone Encounter (Signed)
05/12/13-----143/83 @ 7pm  05/20/13-----138/86 @ 5:30pm  05/21/13-----141/91 @ 2:10pm  05/26/13-----141/86 @ 1:30pm

## 2013-06-02 ENCOUNTER — Telehealth: Payer: Self-pay | Admitting: Family Medicine

## 2013-06-02 MED ORDER — AMLODIPINE BESYLATE 5 MG PO TABS: 5.0000 mg | ORAL_TABLET | Freq: Every day | ORAL | Status: DC

## 2013-06-02 NOTE — Telephone Encounter (Signed)
Please increase amlodipine. Start now at 5 mg daily. #30, 6 refills. Patient may do 90 day if she prefers. Followup spring time. Send [place] blood pressure readings in several weeks.

## 2013-06-02 NOTE — Telephone Encounter (Signed)
Rx sent electronically to [facility]. Patient notified and will send BP readings in a few weeks.

## 2013-06-02 NOTE — Telephone Encounter (Signed)
Patient faxed over her BP readings last week and now that it is time for her to refill her meds, she is just calling to make sure there is no reason to switch anything.      [facility]

## 2013-08-01 ENCOUNTER — Encounter: Payer: Self-pay | Admitting: Nurse Practitioner

## 2013-08-01 ENCOUNTER — Ambulatory Visit (INDEPENDENT_AMBULATORY_CARE_PROVIDER_SITE_OTHER): Payer: PRIVATE HEALTH INSURANCE | Admitting: Nurse Practitioner

## 2013-08-01 ENCOUNTER — Encounter: Payer: Self-pay | Admitting: Family Medicine

## 2013-08-01 VITALS — BP 112/78 | HR 64 | Temp 98.1°F | Ht 66.0 in | Wt 179.0 lb

## 2013-08-01 DIAGNOSIS — J0110 Acute frontal sinusitis, unspecified: Secondary | ICD-10-CM

## 2013-08-01 DIAGNOSIS — H81399 Other peripheral vertigo, unspecified ear: Secondary | ICD-10-CM

## 2013-08-01 DIAGNOSIS — H8103 Meniere's disease, bilateral: Secondary | ICD-10-CM

## 2013-08-01 DIAGNOSIS — H8110 Benign paroxysmal vertigo, unspecified ear: Secondary | ICD-10-CM

## 2013-08-01 MED ORDER — FLUTICASONE PROPIONATE 50 MCG/ACT NA SUSP: 2.0000 | Freq: Every day | NASAL | Status: DC

## 2013-08-01 MED ORDER — METHYLPREDNISOLONE ACETATE 40 MG/ML IJ SUSP
40.0000 mg | Freq: Once | INTRAMUSCULAR | Status: AC
  Administered 2013-08-01: 40 mg via INTRAMUSCULAR

## 2013-08-01 MED ORDER — FEXOFENADINE HCL 180 MG PO TABS: 180.0000 mg | ORAL_TABLET | Freq: Every day | ORAL | Status: DC

## 2013-08-01 MED ORDER — AZITHROMYCIN 250 MG PO TABS: ORAL_TABLET | ORAL | Status: DC

## 2013-08-01 MED ORDER — MECLIZINE HCL 25 MG PO TABS: 25.0000 mg | ORAL_TABLET | Freq: Three times a day (TID) | ORAL | Status: DC | PRN

## 2013-08-06 ENCOUNTER — Telehealth: Payer: Self-pay | Admitting: Family Medicine

## 2013-08-06 MED ORDER — CEFPROZIL 500 MG PO TABS: 500.0000 mg | ORAL_TABLET | Freq: Two times a day (BID) | ORAL | Status: DC

## 2013-08-06 NOTE — Telephone Encounter (Signed)
Patient told to call back today if not feeling any better. She is still having head congestion and would like another antibiotic called in.   [facility]

## 2013-08-06 NOTE — Telephone Encounter (Signed)
Was seen 2/13 and prescribed Z PAK

## 2013-08-06 NOTE — Telephone Encounter (Signed)
Rx sent electronically to Pharmacy. Patient notified.

## 2013-08-06 NOTE — Telephone Encounter (Signed)
Cefzil 500 mg 1 twice a day for the next 10 days if ongoing troubles recommend followup.

## 2013-08-07 ENCOUNTER — Encounter: Payer: Self-pay | Admitting: Nurse Practitioner

## 2013-08-07 NOTE — Progress Notes (Signed)
Subjective:  Presents complaints of frontal area sinus pressure for the past 3 days. Has been slightly off balance. No fever. No cough. Slight head congestion. No sore throat or ear pain. Other than slight blurred vision no visual changes. No difficulty speaking or swallowing. No numbness of the face arms or legs. No syncope. Dizziness occurs occasionally with position change. Will have lab work done at her job soon, will send [place] a copy.  Objective:   BP 112/78  Pulse 64  Temp(Src) 98.1 F (36.7 C) (Oral)  Ht 5\' 6"  (1.676 m)  Wt 179 lb (81.194 kg)  BMI 28.91 kg/m2 NAD. Alert, oriented. TMs significant clear effusion, greater on the right. No erythema. Pharynx injected with slightly green PND noted. Neck supple with mild soft anterior adenopathy. Lungs clear. Heart regular rate rhythm. Pupils equal and reactive to light. EOMs intact without nystagmus. Point-to-point localization normal limit. Muscle strength 5+ bilateral upper extremities. Reflexes normal limit. Romberg negative. Orthostatic BP and pulse stable.  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 calcium carbonate (OS-CAL) 600 MG TABS tablet    Sig: Take 600 mg by mouth daily.  . CycloSPORINE (RESTASIS OP)    Sig: Apply to eye.  . loteprednol (LOTEMAX) 0.5 % ophthalmic suspension    Sig: Place 1 drop into both eyes 2 (two) times daily.  . meclizine (ANTIVERT) 25 MG tablet    Sig: Take 1 tablet (25 mg total) by mouth 3 (three) times daily as needed for dizziness.    Dispense:  30 tablet    Refill:  0    Order Specific Question:  Supervising Provider    Answer:  Mikey Kirschner [[id number]]  . fexofenadine (ALLEGRA) 180 MG tablet    Sig: Take 1 tablet (180 mg total) by mouth daily. Prn allergies    Dispense:  30 tablet   Refill:  5    Order Specific Question:  Supervising Provider    Answer:  Mikey Kirschner [[id number]]  . fluticasone (FLONASE) 50 MCG/ACT nasal spray    Sig: Place 2 sprays into both nostrils daily. Prn head congestion    Dispense:  16 g    Refill:  5    Order Specific Question:  Supervising Provider    Answer:  Mikey Kirschner [[id number]]  . azithromycin (ZITHROMAX Z-PAK) 250 MG tablet    Sig: Take 2 tablets (500 mg) on  Day 1,  followed by 1 tablet (250 mg) once daily on Days 2 through 5.    Dispense:  6 each    Refill:  0    Order Specific Question:  Supervising Provider    Answer:  Mikey Kirschner [[id number]]  . methylPREDNISolone acetate (DEPO-MEDROL) injection 40 mg    Sig:    warning signs reviewed. Call back next week if no improvement, sooner if worse. Expect gradual resolution of dizziness.

## 2013-09-18 ENCOUNTER — Other Ambulatory Visit: Payer: Self-pay | Admitting: Nurse Practitioner

## 2013-10-24 ENCOUNTER — Other Ambulatory Visit: Payer: Self-pay | Admitting: Family Medicine

## 2013-10-31 ENCOUNTER — Ambulatory Visit (INDEPENDENT_AMBULATORY_CARE_PROVIDER_SITE_OTHER): Payer: PRIVATE HEALTH INSURANCE | Admitting: Family Medicine

## 2013-10-31 ENCOUNTER — Encounter: Payer: Self-pay | Admitting: Family Medicine

## 2013-10-31 VITALS — BP 120/70 | Temp 98.2°F | Ht 66.0 in | Wt 176.4 lb

## 2013-10-31 DIAGNOSIS — H8110 Benign paroxysmal vertigo, unspecified ear: Secondary | ICD-10-CM

## 2013-10-31 DIAGNOSIS — I10 Essential (primary) hypertension: Secondary | ICD-10-CM

## 2013-10-31 DIAGNOSIS — M79606 Pain in leg, unspecified: Secondary | ICD-10-CM

## 2013-10-31 DIAGNOSIS — M79609 Pain in unspecified limb: Secondary | ICD-10-CM

## 2013-10-31 LAB — D-DIMER, QUANTITATIVE (NOT AT ARMC): D DIMER QUANT: 0.42 ug{FEU}/mL (ref 0.00–0.48)

## 2013-10-31 MED ORDER — AMLODIPINE BESYLATE 2.5 MG PO TABS: 2.5000 mg | ORAL_TABLET | Freq: Every day | ORAL | Status: DC

## 2013-10-31 NOTE — Progress Notes (Signed)
Patient notified and verbalized understanding of the test results. No further questions. 

## 2013-10-31 NOTE — Progress Notes (Signed)
   Subjective:    Patient ID: [name], female    DOB: [birth year], 53 y.o.   MRN: [id number]  HPI Patient is here today because she is having swelling in her legs and feet. This has been present for about 1-2 weeks now. Patient has been drinking plenty of fluids with no relief. Patient relates a little bit of calf pain but she states the swelling is in both ankles been present for weeks. Worse over the past few days. Denies any injury. Denies shortness of breath or chest pains.  Patient has been experiencing dizziness for about 5-6 days also. Has been taking Meclizine without much relief. She gets more of positional vertigo with certain movements. She relates slight dizziness and nausea.   Review of Systems  Constitutional: Negative for activity change, appetite change and fatigue.  Respiratory: Negative for cough, choking and shortness of breath.   Cardiovascular: Positive for leg swelling. Negative for chest pain.  Endocrine: Negative for polydipsia and polyphagia.  Genitourinary: Negative for frequency.  Neurological: Negative for weakness.  Psychiatric/Behavioral: Negative for confusion.       Objective:   Physical Exam  Vitals reviewed. Constitutional: She appears well-nourished. No distress.  Cardiovascular: Normal rate, regular rhythm and normal heart sounds.   No murmur heard. Pulmonary/Chest: Effort normal and breath sounds normal. No respiratory distress.  Musculoskeletal: She exhibits no edema.  Some pedal edema 1+ in the ankles calf no swelling noted subjective tenderness left side  Lymphadenopathy:    She has no cervical adenopathy.  Neurological: She is alert. She exhibits normal muscle tone.  Psychiatric: Her behavior is normal.          Assessment & Plan:  1. Essential hypertension, benign Patient's blood pressure diminishes when she goes from lying to sitting to standing I would recommend reducing her medication at 2.5 mg daily. She will monitor her  blood pressures if any problems we will notify us otherwise followup within 6 months.  2. Benign paroxysmal positional vertigo I did show her Epley maneuver. She was given a handout on this as well. She can use meclizine as necessary this should gradually get better over the next 7 days if worse may need referral to ENT.  3. Leg pain She does have some discomfort in her left calf no swelling noted. She is on Premarin this puts her at higher risk for blood clot therefore she does need to have d-dimer stat. If normal then that is reassuring - D-dimer, quantitative  Pedal edema-I. feel this is do to third spacing from the amlodipine she is on. Hopefully will be less with a lower dose.

## 2013-11-04 ENCOUNTER — Telehealth: Payer: Self-pay | Admitting: Family Medicine

## 2013-11-04 NOTE — Telephone Encounter (Signed)
Patient states that her blood pressure has been running slightly high (she will be faxing over readings shortly), her feet are still swelling and painful (no improvement since being seen) and she is currently taking furosemide 20 mg daily prn and amlodipine 2.5 mg daily.

## 2013-11-04 NOTE — Telephone Encounter (Signed)
Patient states feet are still swelling and painful. She is taking half of blood pressure pill and fluid pill she wants to know if she should take whole pill.

## 2013-11-04 NOTE — Telephone Encounter (Signed)
Nurses, please talk with patient, find out how her blood pressure is, is the feet having any improvement? Verify is actually the dose of the medicines that she is taking. To my understanding she is doing furosemide 20 mg every morning and amlodipine 2.5 daily

## 2013-11-05 ENCOUNTER — Other Ambulatory Visit: Payer: Self-pay

## 2013-11-05 MED ORDER — INDAPAMIDE 1.25 MG PO TABS
1.2500 mg | ORAL_TABLET | Freq: Every day | ORAL | Status: DC
Start: 2013-11-05 — End: 2013-11-12

## 2013-11-05 MED ORDER — POTASSIUM CHLORIDE ER 10 MEQ PO TBCR: 10.0000 meq | EXTENDED_RELEASE_TABLET | Freq: Every day | ORAL | Status: DC

## 2013-11-05 NOTE — Telephone Encounter (Signed)
Notified patient it is still possible that swelling with her feet is related to amlodipine even at low dosde. Stop amlodipine.stop lasix.New diuretic- Lozol 1.25 mg daily - may have to increase in the future but for now 1.25 each am this diuretic will help BP and feet swelling , #30 with 1 refill. Also KCL 10 meq daily, #30 1 refill. Follow up OV in 3 to 4 weeks, bring BP readings , call if problems. Patient verbalized understanding. Meds sent to pharmacy.

## 2013-11-05 NOTE — Telephone Encounter (Signed)
It is still possible that swelling with her feet is related to amlodipine even at low dosde. Stop amlodipine.stop lasix.New diuretic- Lozol 1.25 mg daily - may have to increase in the future but for now 1.25 each am this diuretic will help BP and feet swelling , #30 with 1 refill. Also KCL 10 meq daily, #30 1 refill. Follow up OV in 3 to 4 weeks, bring BP readings , call if problems.

## 2013-11-07 ENCOUNTER — Telehealth: Payer: Self-pay | Admitting: Family Medicine

## 2013-11-07 NOTE — Telephone Encounter (Signed)
Notified patient so with this point the next thing to do is to double up on diuretic. She will take 2 each morning and we believe it would be wise for her to followup next Wednesday or Thursday. She can continue to take her potassium. It is possible we may have to add additional medicines she comes next week. Patient transferred to front desk to schedule appointment.

## 2013-11-07 NOTE — Telephone Encounter (Signed)
Patient is concerned that her blood pressure is still running high and wants to know if a nurse can call her.

## 2013-11-07 NOTE — Telephone Encounter (Signed)
Patient states that she has been taking the Lozol she started about 2 days ago and her blood pressure today has been running high in the 163/94 range. Patient is concerned and wants to know what do you recommend at this point?

## 2013-11-07 NOTE — Telephone Encounter (Signed)
So with this point the next that to do is to double up on diuretic. She will take 2 each morning and I believe it would be wise for her to followup next Wednesday or Thursday. She can continue to take her potassium. It is possible we may have to add additional medicines she comes next week.

## 2013-11-12 ENCOUNTER — Encounter: Payer: Self-pay | Admitting: Family Medicine

## 2013-11-12 ENCOUNTER — Ambulatory Visit (INDEPENDENT_AMBULATORY_CARE_PROVIDER_SITE_OTHER): Payer: PRIVATE HEALTH INSURANCE | Admitting: Family Medicine

## 2013-11-12 VITALS — BP 110/76 | Ht 66.0 in | Wt 175.0 lb

## 2013-11-12 DIAGNOSIS — I10 Essential (primary) hypertension: Secondary | ICD-10-CM

## 2013-11-12 MED ORDER — POTASSIUM CHLORIDE ER 10 MEQ PO TBCR: 10.0000 meq | EXTENDED_RELEASE_TABLET | Freq: Two times a day (BID) | ORAL | Status: DC

## 2013-11-12 MED ORDER — INDAPAMIDE 2.5 MG PO TABS: 2.5000 mg | ORAL_TABLET | Freq: Every day | ORAL | Status: DC

## 2013-11-12 NOTE — Progress Notes (Signed)
   Subjective:    Patient ID: [name], female    DOB: Jun 15, 1961, 53 y.o.   MRN: [id number]  Hypertension This is a chronic problem.  Taking indapamide 1.25 mg 2 every day.  Patient brought in bp readings.  She denies she denies any type of problems with the current medication.  Review of Systems Denies chest tightness pressure pain shortness of breath nausea vomiting diarrhea denies swelling in the legs    Objective:   Physical Exam  Lungs clear heart regular pulse normal BP is good in both arms      Assessment & Plan:  HTN good control continue current measures indapamide 2.5 mg each morning along with potassium 10 mEq twice daily healthy diet recommended recheck 6 months

## 2013-12-17 LAB — BASIC METABOLIC PANEL
BUN: 20 mg/dL (ref 6–23)
CO2: 27 meq/L (ref 19–32)
Calcium: 9.3 mg/dL (ref 8.4–10.5)
Chloride: 99 mEq/L (ref 96–112)
Creat: 0.93 mg/dL (ref 0.50–1.10)
Glucose, Bld: 93 mg/dL (ref 70–99)
POTASSIUM: 4.9 meq/L (ref 3.5–5.3)
SODIUM: 143 meq/L (ref 135–145)

## 2014-04-24 ENCOUNTER — Ambulatory Visit (INDEPENDENT_AMBULATORY_CARE_PROVIDER_SITE_OTHER): Payer: PRIVATE HEALTH INSURANCE | Admitting: Nurse Practitioner

## 2014-04-24 ENCOUNTER — Encounter: Payer: Self-pay | Admitting: Nurse Practitioner

## 2014-04-24 VITALS — BP 132/86 | Ht 66.0 in | Wt 172.6 lb

## 2014-04-24 DIAGNOSIS — I10 Essential (primary) hypertension: Secondary | ICD-10-CM

## 2014-04-24 DIAGNOSIS — J069 Acute upper respiratory infection, unspecified: Secondary | ICD-10-CM

## 2014-04-24 MED ORDER — AZITHROMYCIN 250 MG PO TABS: ORAL_TABLET | ORAL | Status: DC

## 2014-04-30 NOTE — Progress Notes (Signed)
Subjective:  Presents for routine follow-up. Was out of her pills for 2 days. Is back on all of her medications at this point. No chest pain/ischemic type pain shortness of breath or edema.Will get her routine labs done through her job in spring. Is due for preventive health physical. Also complaints of cough for the past week. Ethmoid sinus area headache. No fever. Cough mainly in the mornings, producing green mucus. At other times nonproductive. No ear pain or sore throat.  Objective:   BP 132/86 mmHg  Ht 5\' 6"  (1.676 m)  Wt 172 lb 9.6 oz (78.291 kg)  BMI 27.87 kg/m2 NAD. Alert, oriented. TMs clear effusion, no erythema. Pharynx injected with PND noted. Neck supple with mild soft anterior adenopathy. Lungs clear. Heart regular rate rhythm. Lower extremities no edema.  Assessment:  Problem List Items Addressed This Visit      Cardiovascular and Mediastinum   Essential hypertension, benign - Primary    Other Visit Diagnoses    Acute upper respiratory infection        Relevant Medications       azithromycin (ZITHROMAX) tablet        Plan:  Meds ordered this encounter  Medications  . Ginkgo Biloba 40 MG TABS    Sig: Take 500 mg by mouth daily.  Marland Kitchen azithromycin (ZITHROMAX Z-PAK) 250 MG tablet    Sig: Take 2 tablets (500 mg) on  Day 1,  followed by 1 tablet (250 mg) once daily on Days 2 through 5.    Dispense:  6 each    Refill:  0    Order Specific Question:  Supervising Provider    Answer:  Mikey Kirschner [[id number]]   OTC meds as directed for congestion and cough. Call back if worsens or persists. Encouraged patient to schedule preventive health physical at next visit.

## 2014-05-15 ENCOUNTER — Other Ambulatory Visit: Payer: Self-pay | Admitting: Family Medicine

## 2014-05-18 ENCOUNTER — Ambulatory Visit (INDEPENDENT_AMBULATORY_CARE_PROVIDER_SITE_OTHER): Payer: PRIVATE HEALTH INSURANCE | Admitting: Nurse Practitioner

## 2014-05-18 ENCOUNTER — Encounter: Payer: Self-pay | Admitting: Nurse Practitioner

## 2014-05-18 VITALS — BP 130/82 | Ht 66.0 in | Wt 172.6 lb

## 2014-05-18 DIAGNOSIS — L659 Nonscarring hair loss, unspecified: Secondary | ICD-10-CM

## 2014-05-18 NOTE — Patient Instructions (Signed)
Biotin as directed Sulfate free shampoo

## 2014-05-18 NOTE — Progress Notes (Signed)
Subjective:  Presents for c/o hair loss x 4-5 months. Worse around the edges of her hairline x 2 months. Has some red bumps, slightly itchy which have improved. No change in hair products. Stress level improved. Loss of eyebrows x 1 year. PMH hysterectomy with BSO. On Premarin.  Objective:   BP 130/82 mmHg  Ht 5\' 6"  (1.676 m)  Wt 172 lb 9.6 oz (78.291 kg)  BMI 27.87 kg/m2 NAD. Alert, oriented. Significant hair loss noted around anterior edge of hairline. Faint discoloration from rash but no current lesions. Eyebrows are almost completely gone.   Assessment: Hair loss - Plan: Ambulatory referral to Dermatology, TSH  Plan: call back sooner if worsens.

## 2014-05-19 ENCOUNTER — Telehealth: Payer: Self-pay | Admitting: Family Medicine

## 2014-05-19 LAB — TSH: TSH: 0.608 u[IU]/mL (ref 0.350–4.500)

## 2014-05-19 MED ORDER — AMOXICILLIN 500 MG PO CAPS: 500.0000 mg | ORAL_CAPSULE | Freq: Three times a day (TID) | ORAL | Status: DC

## 2014-05-19 NOTE — Telephone Encounter (Signed)
Rx sent electronically to pharmacy. Patient notified. 

## 2014-05-19 NOTE — Telephone Encounter (Signed)
Amoxicillin 500 mg 1 3 times a day 10 days, if the ear pain is not getting better over the next few days consider rechecking, we will leave it up to the patient to call for appointment if ongoing troubles

## 2014-05-19 NOTE — Telephone Encounter (Signed)
Patient was seen yesterday by [name] for hair loss. She said that today she is experiencing ear pain and has tried several different remedies with no relief.  She wants to know if since she was just seen yesterday, can we call her something in for this?   [name]

## 2014-05-19 NOTE — Telephone Encounter (Signed)
Patient states that she is having right ear pain and runny nose. No other symptoms. Patient has been taking Allegra daily.

## 2014-07-15 ENCOUNTER — Encounter: Payer: Self-pay | Admitting: Nurse Practitioner

## 2014-07-15 ENCOUNTER — Ambulatory Visit (INDEPENDENT_AMBULATORY_CARE_PROVIDER_SITE_OTHER): Payer: PRIVATE HEALTH INSURANCE | Admitting: Nurse Practitioner

## 2014-07-15 VITALS — BP 130/78 | Ht 66.0 in | Wt 177.6 lb

## 2014-07-15 DIAGNOSIS — L659 Nonscarring hair loss, unspecified: Secondary | ICD-10-CM

## 2014-07-15 DIAGNOSIS — R5383 Other fatigue: Secondary | ICD-10-CM

## 2014-07-16 ENCOUNTER — Encounter: Payer: Self-pay | Admitting: Nurse Practitioner

## 2014-07-16 LAB — TESTOSTERONE: Testosterone: 24 ng/dL (ref 10–70)

## 2014-07-16 LAB — PROGESTERONE: PROGESTERONE: 0.2 ng/mL

## 2014-07-16 LAB — ANA: Anti Nuclear Antibody(ANA): NEGATIVE

## 2014-07-18 ENCOUNTER — Encounter: Payer: Self-pay | Admitting: Nurse Practitioner

## 2014-07-18 NOTE — Progress Notes (Signed)
Subjective:  Presents for recheck of hair loss. Recently went to dermatology. No known cause of hair loss. Prescribed clobetasol for mild inflammation around the frontal edges of the hairline. 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 Questions what would be safe to do for her symptoms. No unexplained fevers. No joint pain. Mild fatigue. No excessive hair growth or acne.  Objective:   BP 130/78 mmHg  Ht 5\' 6"  (1.676 m)  Wt 177 lb 9.6 oz (80.559 kg)  BMI 28.68 kg/m2 NAD. Alert, oriented. Mild hair loss noted along the frontal edges of the hairline. Patient has a picture on her phone that is similar to last assessment with faint pink papules also noted in this area. Lungs clear. Heart regular rate rhythm.  Assessment: Hair loss - Plan: Testosterone, Progesterone, Antinuclear Antib (ANA)  Other fatigue - Plan: Testosterone, Progesterone, Antinuclear Antib (ANA)  Plan: Reviewed information patient brought in today. Many of the treatments are for specific conditions even though it's listed under FFA. For example Plaquenil is listed which is for lupus. Explained to patient that many of the treatments are for specific conditions. We agreed for her to try OTC minoxidil on affected areas. Lab test pending. Clobetasol as directed to any inflammation use no more than 2 weeks at a time. Patient to try these interventions for the next several weeks and call back if no improvement.

## 2014-07-20 ENCOUNTER — Other Ambulatory Visit: Payer: Self-pay | Admitting: Nurse Practitioner

## 2014-07-31 ENCOUNTER — Other Ambulatory Visit: Payer: Self-pay | Admitting: Family Medicine

## 2014-09-29 ENCOUNTER — Encounter: Payer: Self-pay | Admitting: Family Medicine

## 2014-09-29 ENCOUNTER — Ambulatory Visit (INDEPENDENT_AMBULATORY_CARE_PROVIDER_SITE_OTHER): Payer: PRIVATE HEALTH INSURANCE | Admitting: Family Medicine

## 2014-09-29 VITALS — BP 130/82 | Ht 66.0 in | Wt 178.8 lb

## 2014-09-29 DIAGNOSIS — R079 Chest pain, unspecified: Secondary | ICD-10-CM

## 2014-09-29 DIAGNOSIS — M609 Myositis, unspecified: Secondary | ICD-10-CM | POA: Diagnosis not present

## 2014-09-29 DIAGNOSIS — M940 Chondrocostal junction syndrome [Tietze]: Secondary | ICD-10-CM | POA: Diagnosis not present

## 2014-09-29 DIAGNOSIS — M791 Myalgia: Secondary | ICD-10-CM

## 2014-09-29 DIAGNOSIS — IMO0001 Reserved for inherently not codable concepts without codable children: Secondary | ICD-10-CM

## 2014-09-29 MED ORDER — CHLORZOXAZONE 500 MG PO TABS: 500.0000 mg | ORAL_TABLET | Freq: Four times a day (QID) | ORAL | Status: DC | PRN

## 2014-09-29 MED ORDER — DICLOFENAC SODIUM 75 MG PO TBEC: 75.0000 mg | DELAYED_RELEASE_TABLET | Freq: Two times a day (BID) | ORAL | Status: DC

## 2014-09-29 NOTE — Progress Notes (Signed)
   Subjective:    Patient ID: [name], female    DOB: October 13, 1960, 54 y.o.   MRN: [id number]  HPI  Patient arrives with complaint of neck pain that goes down her  left  shoulder and down into her left breast area. Pain worse with movement and touch. Pain started Friday.  patient states that the pain in her neck is bilateral more into the left arm than the right arm no numbness in the hands she denies any shortness of breath or chest tightness denies substernal discomfort she does relate soreness in her chest. She worred about if she might be having a heart attack but she denies any chest pressure tightnesswith activity    patient has family history of heart disease stroke other issues.   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 Review of Systems     Objective:   Physical Exam  neck no masses lungs are clear heart regular pulse normal   patient has tenderness in the paraspinal muscles of the neck both sides she has tenderness in to the trapezius muscles and tenderness into both arms worse on the left than the right she also has significant costochondritis with  Exceptional tenderness in the left lower sternum area- patient states that the pain is so severe that it brings her to tears    EKG looks good there is no acute changes    Assessment & Plan:   myalgias- feel some of this could well be related to her were with computer keyboard injury we will check a sedimentation rate and a CK to rule out other problems   costochondritis anti-inflammatory prescribed    muscle tightness muscle relaxers for home use only    work excuse for the next 3-4 days   I doubt that the patient is having cardiac disease , I do recommend her wellness lab work later in May with a female wellness visitwith [name] in June

## 2014-10-01 LAB — CK: Total CK: 87 U/L (ref 24–173)

## 2014-10-01 LAB — SEDIMENTATION RATE: SED RATE: 9 mm/h (ref 0–40)

## 2014-10-02 ENCOUNTER — Encounter: Payer: Self-pay | Admitting: Family Medicine

## 2014-10-12 ENCOUNTER — Encounter: Payer: Self-pay | Admitting: Family Medicine

## 2014-11-06 ENCOUNTER — Other Ambulatory Visit: Payer: Self-pay | Admitting: Nurse Practitioner

## 2014-11-06 ENCOUNTER — Ambulatory Visit (INDEPENDENT_AMBULATORY_CARE_PROVIDER_SITE_OTHER): Payer: PRIVATE HEALTH INSURANCE | Admitting: Nurse Practitioner

## 2014-11-06 ENCOUNTER — Encounter: Payer: Self-pay | Admitting: Nurse Practitioner

## 2014-11-06 VITALS — BP 140/90 | Ht 66.0 in | Wt 179.1 lb

## 2014-11-06 DIAGNOSIS — Z0000 Encounter for general adult medical examination without abnormal findings: Secondary | ICD-10-CM | POA: Diagnosis not present

## 2014-11-06 DIAGNOSIS — Z1231 Encounter for screening mammogram for malignant neoplasm of breast: Secondary | ICD-10-CM

## 2014-11-06 DIAGNOSIS — Z01419 Encounter for gynecological examination (general) (routine) without abnormal findings: Secondary | ICD-10-CM

## 2014-11-09 ENCOUNTER — Encounter: Payer: Self-pay | Admitting: Nurse Practitioner

## 2014-11-09 NOTE — Progress Notes (Signed)
   Subjective:    Patient ID: [name], female    DOB: [birth year], 54 y.o.   MRN: [id number]  HPI presents for her wellness exam. Married, same sexual partner. Has had hysterectomy with BSO. Regular vision and dental exams. Does fairly well with diet. Regular walking program. Stopped taking Lozol because she read it contributed to hair loss.    Review of Systems  Constitutional: Negative for activity change, appetite change and fatigue.  HENT: Negative for dental problem, ear pain, sinus pressure and sore throat.   Respiratory: Negative for cough, chest tightness, shortness of breath and wheezing.   Cardiovascular: Negative for chest pain.  Gastrointestinal: Negative for nausea, vomiting, abdominal pain, diarrhea, constipation, blood in stool and abdominal distention.  Genitourinary: Negative for dysuria, urgency, frequency, vaginal discharge, enuresis, difficulty urinating, genital sores and pelvic pain.       Objective:   Physical Exam  Constitutional: She is oriented to person, place, and time. She appears well-developed. No distress.  HENT:  Right Ear: External ear normal.  Left Ear: External ear normal.  Mouth/Throat: Oropharynx is clear and moist.  Neck: Normal range of motion. Neck supple. No tracheal deviation present. No thyromegaly present.  Cardiovascular: Normal rate, regular rhythm and normal heart sounds.  Exam reveals no gallop.   No murmur heard. Pulmonary/Chest: Effort normal and breath sounds normal.  Abdominal: Soft. She exhibits no distension. There is no tenderness.  Genitourinary: Vagina normal. No vaginal discharge found.  External GU: No rashes or lesions. Vagina slightly pale, no discharge. Bimanual exam normal. Rectal exam no masses, no stool for Hemoccult.  Musculoskeletal: She exhibits no edema.  Lymphadenopathy:    She has no cervical adenopathy.  Neurological: She is alert and oriented to person, place, and time.  Skin: Skin is warm and dry. No  rash noted.  Psychiatric: She has a normal mood and affect. Her behavior is normal. Thought content normal.  Vitals reviewed.  Breast exam: no masses, axilla no adenopathy.        Assessment & Plan:  Well woman exam  Encounter for screening mammogram for breast cancer - Plan: MM DIGITAL SCREENING BILATERAL  Recommend daily vitamin D and calcium supplementation. Encouraged regular activity. Monitor BP outside office. Call back if over 140/90. Hold on BP med for now. Return in about 6 months (around 05/09/2015) for recheck BP.

## 2014-11-13 ENCOUNTER — Encounter: Payer: Self-pay | Admitting: Nurse Practitioner

## 2014-11-19 ENCOUNTER — Encounter: Payer: Self-pay | Admitting: Nurse Practitioner

## 2014-11-19 ENCOUNTER — Ambulatory Visit (INDEPENDENT_AMBULATORY_CARE_PROVIDER_SITE_OTHER): Payer: PRIVATE HEALTH INSURANCE | Admitting: Nurse Practitioner

## 2014-11-19 VITALS — BP 144/90 | Temp 98.4°F | Ht 66.0 in | Wt 180.4 lb

## 2014-11-19 DIAGNOSIS — R609 Edema, unspecified: Secondary | ICD-10-CM | POA: Diagnosis not present

## 2014-11-19 DIAGNOSIS — R600 Localized edema: Secondary | ICD-10-CM | POA: Insufficient documentation

## 2014-11-19 MED ORDER — FUROSEMIDE 20 MG PO TABS: 20.0000 mg | ORAL_TABLET | Freq: Every day | ORAL | Status: DC

## 2014-11-19 NOTE — Progress Notes (Signed)
Subjective:  Presents for complaints of peripheral edema. Had a recent cardiac workup see previous note. Also her recent labs done at work were all normal. No shortness of breath. No orthopnea. No change in sodium intake. Denies any excessive sodium intake. No OTC herbals or supplements. No chest pain/ischemic type pain. Her sister takes furosemide for edema. No change in her activity or job.    Objective:   BP 144/90 mmHg  Temp(Src) 98.4 F (36.9 C) (Oral)  Ht 5\' 6"  (1.676 m)  Wt 180 lb 6 oz (81.818 kg)  BMI 29.13 kg/m2 NAD. Alert, oriented. Lungs clear. Heart regular rate rhythm. No JVD. Lower extremities 1-2 plus pitting edema in the ankles and feet bilaterally none in the lower legs. Mild superficial varicosities noted mostly in the left leg.  Assessment:  Problem List Items Addressed This Visit      Other   Peripheral edema - Primary   Relevant Orders   Basic metabolic panel     Plan:  Meds ordered this encounter  Medications  . cetirizine (ZYRTEC) 10 MG tablet    Sig: Take 10 mg by mouth daily.  . furosemide (LASIX) 20 MG tablet    Sig: Take 1 tablet (20 mg total) by mouth daily.    Dispense:  30 tablet    Refill:  2    Order Specific Question:  Supervising Provider    Answer:  Mikey Kirschner [[id number]]   Restart daily potassium supplement. Stop indapamide. Encourage regular activity and continued weight loss. Low-sodium diet. Warning signs reviewed. Return in about 6 weeks (around 12/31/2014). Call back sooner if any problems.

## 2014-11-20 ENCOUNTER — Ambulatory Visit (HOSPITAL_COMMUNITY)
Admission: RE | Admit: 2014-11-20 | Discharge: 2014-11-20 | Disposition: A | Discharge status: Home / Self Care | Payer: PRIVATE HEALTH INSURANCE | Source: Ambulatory Visit | Attending: Nurse Practitioner | Admitting: Nurse Practitioner

## 2014-11-20 ENCOUNTER — Other Ambulatory Visit: Payer: Self-pay

## 2014-11-20 DIAGNOSIS — M25473 Effusion, unspecified ankle: Secondary | ICD-10-CM

## 2014-11-20 DIAGNOSIS — Z1231 Encounter for screening mammogram for malignant neoplasm of breast: Secondary | ICD-10-CM

## 2014-11-20 LAB — BASIC METABOLIC PANEL
BUN/Creatinine Ratio: 13 (ref 9–23)
BUN: 11 mg/dL (ref 6–24)
CO2: 25 mmol/L (ref 18–29)
CREATININE: 0.82 mg/dL (ref 0.57–1.00)
Calcium: 10 mg/dL (ref 8.7–10.2)
Chloride: 100 mmol/L (ref 97–108)
GFR calc Af Amer: 94 mL/min/{1.73_m2} (ref >59)
GFR calc non Af Amer: 82 mL/min/{1.73_m2} (ref >59)
Glucose: 83 mg/dL (ref 65–99)
POTASSIUM: 4.1 mmol/L (ref 3.5–5.2)
Sodium: 141 mmol/L (ref 134–144)

## 2014-11-21 LAB — BRAIN NATRIURETIC PEPTIDE: BNP: 29.5 pg/mL (ref 0.0–100.0)

## 2014-11-24 ENCOUNTER — Other Ambulatory Visit: Payer: Self-pay | Admitting: Family Medicine

## 2014-11-27 ENCOUNTER — Encounter: Payer: Self-pay | Admitting: Nurse Practitioner

## 2014-11-30 ENCOUNTER — Other Ambulatory Visit: Payer: Self-pay | Admitting: Nurse Practitioner

## 2014-12-01 ENCOUNTER — Other Ambulatory Visit: Payer: Self-pay | Admitting: Nurse Practitioner

## 2014-12-01 MED ORDER — ESTRADIOL 0.05 MG/24HR TD PTTW: 1.0000 | MEDICATED_PATCH | TRANSDERMAL | Status: DC

## 2014-12-28 ENCOUNTER — Encounter: Payer: Self-pay | Admitting: Nurse Practitioner

## 2014-12-29 ENCOUNTER — Other Ambulatory Visit: Payer: Self-pay | Admitting: Nurse Practitioner

## 2014-12-29 MED ORDER — ESTRADIOL-LEVONORGESTREL 0.045-0.015 MG/DAY TD PTWK: 1.0000 | MEDICATED_PATCH | TRANSDERMAL | Status: DC

## 2014-12-31 ENCOUNTER — Encounter: Payer: Self-pay | Admitting: Nurse Practitioner

## 2014-12-31 ENCOUNTER — Ambulatory Visit (INDEPENDENT_AMBULATORY_CARE_PROVIDER_SITE_OTHER): Payer: PRIVATE HEALTH INSURANCE | Admitting: Nurse Practitioner

## 2014-12-31 VITALS — BP 120/84 | Ht 66.0 in | Wt 179.0 lb

## 2014-12-31 DIAGNOSIS — M778 Other enthesopathies, not elsewhere classified: Secondary | ICD-10-CM

## 2014-12-31 DIAGNOSIS — Z780 Asymptomatic menopausal state: Secondary | ICD-10-CM | POA: Diagnosis not present

## 2014-12-31 DIAGNOSIS — I10 Essential (primary) hypertension: Secondary | ICD-10-CM | POA: Diagnosis not present

## 2014-12-31 DIAGNOSIS — M7581 Other shoulder lesions, right shoulder: Secondary | ICD-10-CM | POA: Diagnosis not present

## 2014-12-31 DIAGNOSIS — R609 Edema, unspecified: Secondary | ICD-10-CM

## 2014-12-31 NOTE — Patient Instructions (Addendum)
Icy Hot smart relief TENS unit

## 2015-01-01 ENCOUNTER — Encounter: Payer: Self-pay | Admitting: Nurse Practitioner

## 2015-01-01 NOTE — Progress Notes (Signed)
Subjective:  Presents for recheck on her blood pressure and edema. Both are greatly improved. Taking Lasix and K Dur daily. No chest pain shortness of breath. Has had some problems with hair loss see previous notes. 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 Has not started her new hormone patch which was sent in this week. Also complaints of right shoulder pain related to overuse at work. Massage has helped greatly. Pain along the upper back and shoulder area. Worse with certain movements.  Objective:   BP 120/84 mmHg  Ht 5\' 6"  (1.676 m)  Wt 179 lb (81.194 kg)  BMI 28.91 kg/m2 NAD. Alert, oriented. Lungs clear. Heart regular rate rhythm. Lower extremities no edema. Very tight tender muscles noted along the upper back and right cervical area. Mild tenderness along the mid shoulder joint line. Tenderness with full rotation of the shoulder. Can perform full are OM without difficulty. Hand and arm strength 5+ bilateral. Sensation grossly intact.  Assessment:  Problem List Items Addressed This Visit      Cardiovascular and Mediastinum   Essential hypertension, benign - Primary     Other   Peripheral edema    Other Visit Diagnoses    Post-menopausal        Right shoulder tendonitis          Plan: Continue massage therapy. Continue shoulder exercises. Anti-inflammatories as directed. Defers orthopedic referral at this time. Call back if symptoms worsen or persist. Return in about 6 months (around 07/03/2015).

## 2015-01-14 ENCOUNTER — Encounter: Payer: Self-pay | Admitting: Nurse Practitioner

## 2015-01-15 ENCOUNTER — Other Ambulatory Visit: Payer: Self-pay | Admitting: Family Medicine

## 2015-01-25 ENCOUNTER — Other Ambulatory Visit: Payer: Self-pay | Admitting: Nurse Practitioner

## 2015-02-09 ENCOUNTER — Encounter: Payer: Self-pay | Admitting: Nurse Practitioner

## 2015-02-11 ENCOUNTER — Ambulatory Visit (INDEPENDENT_AMBULATORY_CARE_PROVIDER_SITE_OTHER): Payer: PRIVATE HEALTH INSURANCE | Admitting: Family Medicine

## 2015-02-11 ENCOUNTER — Encounter: Payer: Self-pay | Admitting: Family Medicine

## 2015-02-11 VITALS — BP 132/88 | Temp 98.5°F | Ht 66.0 in | Wt 180.2 lb

## 2015-02-11 DIAGNOSIS — J0110 Acute frontal sinusitis, unspecified: Secondary | ICD-10-CM | POA: Diagnosis not present

## 2015-02-11 MED ORDER — AMOXICILLIN-POT CLAVULANATE 875-125 MG PO TABS: 1.0000 | ORAL_TABLET | Freq: Two times a day (BID) | ORAL | Status: AC

## 2015-02-11 MED ORDER — BENZONATATE 100 MG PO CAPS: 100.0000 mg | ORAL_CAPSULE | Freq: Three times a day (TID) | ORAL | Status: DC | PRN

## 2015-02-11 NOTE — Progress Notes (Signed)
   Subjective:    Patient ID: [name], female    DOB: [birth year], 54 y.o.   MRN: [id number]  Sinusitis This is a new problem. The current episode started in the past 7 days. The problem is unchanged. Maximum temperature: 99.5. The pain is moderate. Associated symptoms include chills, congestion, coughing, ear pain, headaches and a sore throat. (Achy) Treatments tried: Theraflu. The treatment provided no relief.    Temp 99.5 diminished energy  achey and cough   Non smoker  No energy   Review of Systems  Constitutional: Positive for chills.  HENT: Positive for congestion, ear pain and sore throat.   Respiratory: Positive for cough.   Neurological: Positive for headaches.       Objective:   Physical Exam Alert moderate malaise intermittent cough during exam. Vital stable. H&T moderate frontal maxillary tenderness left side face normal neck supple. Lungs bronchial cough but no wheezes or crackles no tachypnea heart regular in rhythm       Assessment & Plan:  Impression rhinosinusitis/bronchitis plan antibiotics prescribed. Symptom care discussed. Warning signs discussed WSL

## 2015-02-24 ENCOUNTER — Ambulatory Visit (INDEPENDENT_AMBULATORY_CARE_PROVIDER_SITE_OTHER): Payer: PRIVATE HEALTH INSURANCE | Admitting: Adult Health

## 2015-02-24 ENCOUNTER — Encounter: Payer: Self-pay | Admitting: Adult Health

## 2015-02-24 VITALS — BP 142/92 | HR 68 | Ht 67.0 in | Wt 181.0 lb

## 2015-02-24 DIAGNOSIS — N951 Menopausal and female climacteric states: Secondary | ICD-10-CM | POA: Insufficient documentation

## 2015-02-24 DIAGNOSIS — Z79899 Other long term (current) drug therapy: Secondary | ICD-10-CM | POA: Diagnosis not present

## 2015-02-24 DIAGNOSIS — R232 Flushing: Secondary | ICD-10-CM | POA: Insufficient documentation

## 2015-02-24 DIAGNOSIS — L659 Nonscarring hair loss, unspecified: Secondary | ICD-10-CM | POA: Diagnosis not present

## 2015-02-24 DIAGNOSIS — Z79818 Long term (current) use of other agents affecting estrogen receptors and estrogen levels: Secondary | ICD-10-CM | POA: Insufficient documentation

## 2015-02-24 HISTORY — DX: Other long term (current) drug therapy: Z79.899

## 2015-02-24 HISTORY — DX: Flushing: R23.2

## 2015-02-24 HISTORY — DX: Long term (current) use of other agents affecting estrogen receptors and estrogen levels: Z79.818

## 2015-02-24 HISTORY — DX: Nonscarring hair loss, unspecified: L65.9

## 2015-02-24 MED ORDER — EST ESTROGENS-METHYLTEST 0.625-1.25 MG PO TABS: 1.0000 | ORAL_TABLET | Freq: Every day | ORAL | Status: DC

## 2015-02-24 NOTE — Patient Instructions (Signed)
Esterified Estrogens; Methyltestosterone tablets What is this medicine? ESTERIFIED ESTROGENS; METHYLTESTOSTERONE (es TAIR i fyed ES troe jenz; meth il tes TOS te rone) is a combination of hormones. This medicine is used to treat some of the symptoms of menopause like hot flashes and vaginal dryness. This medicine may be used for other purposes; ask your health care provider or pharmacist if you have questions. COMMON BRAND NAME(S): Covaryx, Covaryx H.S., EEMT, EEMT HS, Essian, Essian HS, Estratest, Estratest HS, Syntest DS, Syntest HS What should I tell my health care provider before I take this medicine? They need to know if you have any of these conditions: -abnormal vaginal bleeding -blood vessel disease or blood clots -breast, cervical, endometrial, ovarian, liver, or uterine cancer -dementia -diabetes -gallbladder disease -heart disease or recent heart attack -high blood pressure -high cholesterol -high level of calcium in the blood -hysterectomy -kidney disease -liver disease -migraine headaches -stroke -systemic lupus erythematosus (SLE) -tobacco smoker -vaginal bleeding -an unusual or allergic reaction to estrogens, other hormones, medicines, foods, dyes, or preservatives -pregnant or trying to get pregnant -breast-feeding How should I use this medicine? Take this medicine by mouth with a glass of water. To reduce nausea, this medicine may be taken with food. Follow the directions on the prescription label. Take this medicine at the same time each day. Do not take your medicine more often than directed. Talk to your pediatrician regarding the use of this medicine in children. Special care may be needed. A patient package insert for the product will be given with each prescription and refill. Read this sheet carefully each time. The sheet may change frequently. Overdosage: If you think you have taken too much of this medicine contact a poison control center or emergency room at  once. NOTE: This medicine is only for you. Do not share this medicine with others. What if I miss a dose? If you miss a dose, take it as soon as you can. If it is almost time for your next dose, take only that dose. Do not take double or extra doses. What may interact with this medicine? Do not take this medicine with any of the following medications: -medicines for cancer like aminoglutethimide, anastrozole, exemestane, letrozole, testolactone, vorozole This medicine may also interact with the following medications: -antibiotics like erythromycin, clarithromycin -carbamazepine -female hormones, like estrogens or progestins and birth control pills -grapefruit juice -herbal remedies for menopause or female problems -insulin -itraconazole -ketoconazole -medicines that treat or prevent blood clots like warfarin -oxyphenbutazone -phenobarbital -rifampin -ritonavir -St. John's Wort This list may not describe all possible interactions. Give your health care provider a list of all the medicines, herbs, non-prescription drugs, or dietary supplements you use. Also tell them if you smoke, drink alcohol, or use illegal drugs. Some items may interact with your medicine. What should I watch for while using this medicine? Visit your doctor or health care professional for regular checks on your progress. You will need a regular breast and pelvic exam and Pap smear while on this medicine. You should also discuss the need for regular mammograms with your health care professional, and follow his or her guidelines for these tests. This medicine can make your body retain fluid, making your fingers, hands, or ankles swell. Your blood pressure can go up. Contact your doctor or health care professional if you feel you are retaining fluid. If you have any reason to think you are pregnant, stop taking this medicine right away and contact your doctor or health care professional. Smoking  increases the risk of  getting a blood clot or having a stroke while you are taking this medicine, especially if you are more than 54 years old. You are strongly advised not to smoke. If you wear contact lenses and notice visual changes, or if the lenses begin to feel uncomfortable, consult your eye doctor or health care professional. This medicine can increase the risk of developing a condition (endometrial hyperplasia) that may lead to cancer of the lining of the uterus. Taking progestins, another hormone drug, with this medicine lowers the risk of developing this condition. Therefore, if your uterus has not been removed (by a hysterectomy), your doctor may prescribe a progestin for you to take together with your estrogen. You should know, however, that taking estrogens with progestins may have additional health risks. You should discuss the use of estrogens and progestins with your health care professional to determine the benefits and risks for you. If you are going to have surgery, you may need to stop taking this medicine. Consult your health care professional for advice before you schedule the surgery. What side effects may I notice from receiving this medicine? Side effects that you should report to your doctor or health care professional as soon as possible: -allergic reactions like skin rash, itching or hives, swelling of the face, lips, or tongue -breast tissue changes or discharge -changes in vision -chest pain -confusion, trouble speaking or understanding -dark urine -general ill feeling or flu-like symptoms -light-colored stools -nausea, vomiting -pain, swelling, warmth in the leg -right upper belly pain -severe headaches -shortness of breath -sudden numbness or weakness of the face, arm or leg -trouble walking, dizziness, loss of balance or coordination -unusual vaginal bleeding -yellowing of the eyes or skin Side effects that usually do not require medical attention (report to your doctor or health  care professional if they continue or are bothersome): -hair loss -increased hunger or thirst -increased urination -symptoms of vaginal infection like itching, irritation or unusual discharge -unusually weak or tired This list may not describe all possible side effects. Call your doctor for medical advice about side effects. You may report side effects to FDA at 1-800-FDA-1088. Where should I keep my medicine? Keep out of the reach of children. Store at room temperature between 15 and 30 degrees C (59 and 86 degrees F). Throw away any unused medicine after the expiration date. NOTE: This sheet is a summary. It may not cover all possible information. If you have questions about this medicine, talk to your doctor, pharmacist, or health care provider.  2015, Elsevier/Gold Standard. (2008-05-21 11:46:40) Take estratest 1 daily and follow up in 8 weeks

## 2015-02-24 NOTE — Progress Notes (Signed)
Subjective:     Patient ID: [name], female   DOB: 08-05-1960, 54 y.o.   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 PCP Mickie Hillier.  Review of Systems Patient denies any headaches, hearing loss, fatigue, blurred vision, shortness of breath, chest pain, abdominal pain, problems with bowel movements, urination. No joint pain or mood swings.See HPI for positives. Reviewed past medical,surgical, social and family history. Reviewed medications and allergies.     Objective:   Physical Exam BP 142/92 mmHg  Pulse 68  Ht 5\' 7"  (1.702 m)  Wt 181 lb (82.101 kg)  BMI 28.34 kg/m2   Skin warm and dry. Neck: mid line trachea, normal thyroid, good ROM, no lymphadenopathy noted. Lungs: clear to ausculation bilaterally. Cardiovascular: regular rate and rhythm.Has thinning of hair and has loss of eye brows.Discussed changing estrogen and trying some testosterone and she awares, discussed with Dr Glo Herring and he agrees.   Assessment:     Estrogen therapy Hair loss  Hot flashes    Plan:    Stop premarin Rx estratest HS 1 daily #30 with 5 refills Check TSH and thyroid panel Follow up in 8 weeks Review handout on estratest

## 2015-02-25 ENCOUNTER — Telehealth: Payer: Self-pay | Admitting: Adult Health

## 2015-02-25 LAB — T3: T3, Total: 139 ng/dL (ref 71–180)

## 2015-02-25 LAB — T3 UPTAKE
FREE THYROXINE INDEX: 2.3 (ref 1.2–4.9)
T3 Uptake Ratio: 23 % — ABNORMAL LOW (ref 24–39)

## 2015-02-25 LAB — T4: T4 TOTAL: 10 ug/dL (ref 4.5–12.0)

## 2015-02-25 LAB — T3, FREE: T3 FREE: 2.9 pg/mL (ref 2.0–4.4)

## 2015-02-25 LAB — T4, FREE: Free T4: 1.19 ng/dL (ref 0.82–1.77)

## 2015-02-25 LAB — TSH: TSH: 1.04 u[IU]/mL (ref 0.450–4.500)

## 2015-02-25 NOTE — Telephone Encounter (Signed)
Left message thyroid was normal

## 2015-03-04 ENCOUNTER — Encounter: Payer: Self-pay | Admitting: Adult Health

## 2015-03-04 ENCOUNTER — Telehealth: Payer: Self-pay | Admitting: Adult Health

## 2015-03-04 NOTE — Telephone Encounter (Signed)
Left message labs normal, call me back

## 2015-03-04 NOTE — Telephone Encounter (Signed)
Will call pt.

## 2015-03-05 DIAGNOSIS — Z0289 Encounter for other administrative examinations: Secondary | ICD-10-CM

## 2015-04-21 ENCOUNTER — Ambulatory Visit (INDEPENDENT_AMBULATORY_CARE_PROVIDER_SITE_OTHER): Payer: PRIVATE HEALTH INSURANCE | Admitting: Adult Health

## 2015-04-21 ENCOUNTER — Encounter: Payer: Self-pay | Admitting: Adult Health

## 2015-04-21 VITALS — BP 138/92 | HR 60 | Ht 66.0 in | Wt 180.5 lb

## 2015-04-21 DIAGNOSIS — Z79899 Other long term (current) drug therapy: Secondary | ICD-10-CM

## 2015-04-21 DIAGNOSIS — N951 Menopausal and female climacteric states: Secondary | ICD-10-CM

## 2015-04-21 DIAGNOSIS — F32A Depression, unspecified: Secondary | ICD-10-CM

## 2015-04-21 DIAGNOSIS — R232 Flushing: Secondary | ICD-10-CM

## 2015-04-21 DIAGNOSIS — F329 Major depressive disorder, single episode, unspecified: Secondary | ICD-10-CM | POA: Diagnosis not present

## 2015-04-21 DIAGNOSIS — L659 Nonscarring hair loss, unspecified: Secondary | ICD-10-CM

## 2015-04-21 HISTORY — DX: Depression, unspecified: F32.A

## 2015-04-21 MED ORDER — ESTRADIOL 1.53 MG/SPRAY TD SOLN: 1.0000 | Freq: Every day | TRANSDERMAL | Status: DC

## 2015-04-21 NOTE — Progress Notes (Signed)
Subjective:     Patient ID: [name], female   DOB: Dec 11, 1960, 54 y.o.   MRN: [id number]  HPI [name] is a 54 year old black female back in follow up of starting estratest HS about 2 months ago.She says hot flashes a little better and sex drive is better, still has haor loss and is feeling depressed at times, and some nausea with meds.  Review of Systems See HPI for positives, all other systems negative Reviewed past medical,surgical, social and family history. Reviewed medications and allergies.     Objective:   Physical Exam BP 138/92 mmHg  Pulse 60  Ht 5\' 6"  (1.676 m)  Wt 180 lb 8 oz (81.874 kg)  BMI 29.15 kg/m2 Skin warm and dry. Lungs: clear to ausculation bilaterally. Cardiovascular: regular rate and rhythm. PHQ 9 score was nine.Discussed stopping estratest HS and trying just estrogen again, will Rx evamist to try,1 spray daily but can increase up to 3 if needed.    Assessment:     Estrogen therapy Hair loss Hot flashes Depression     Plan:     Stop estratest HS Rx evamist 1 spray daily, with 6 refills Follow up in 6 weeks

## 2015-04-23 ENCOUNTER — Encounter: Payer: Self-pay | Admitting: Adult Health

## 2015-04-30 ENCOUNTER — Ambulatory Visit (INDEPENDENT_AMBULATORY_CARE_PROVIDER_SITE_OTHER): Payer: PRIVATE HEALTH INSURANCE | Admitting: Nurse Practitioner

## 2015-04-30 ENCOUNTER — Encounter: Payer: Self-pay | Admitting: Nurse Practitioner

## 2015-04-30 VITALS — BP 138/90 | Ht 66.0 in | Wt 178.0 lb

## 2015-04-30 DIAGNOSIS — N958 Other specified menopausal and perimenopausal disorders: Secondary | ICD-10-CM | POA: Diagnosis not present

## 2015-04-30 DIAGNOSIS — E8940 Asymptomatic postprocedural ovarian failure: Secondary | ICD-10-CM

## 2015-04-30 DIAGNOSIS — R7303 Prediabetes: Secondary | ICD-10-CM | POA: Insufficient documentation

## 2015-04-30 DIAGNOSIS — Z90710 Acquired absence of both cervix and uterus: Principal | ICD-10-CM

## 2015-04-30 DIAGNOSIS — L659 Nonscarring hair loss, unspecified: Secondary | ICD-10-CM | POA: Diagnosis not present

## 2015-04-30 MED ORDER — MEDROXYPROGESTERONE ACETATE 5 MG PO TABS: ORAL_TABLET | ORAL | Status: DC

## 2015-04-30 NOTE — Progress Notes (Signed)
Subjective:  Presents for recheck regarding her excessive hair loss along the front of her scalp near the temporal area. Has been to 2 different dermatologist who have not provided any explanation. Went to natural therapy in [place] which was very expensive, over $600 worth of lab work. Her hemoglobin A1c in August was 5.9. Has been started on estramist which is an estrogen spray applied to the arm. Would like to restart progesterone as well.  Objective:   BP 138/90 mmHg  Ht 5\' 6"  (1.676 m)  Wt 178 lb (80.74 kg)  BMI 28.74 kg/m2 NAD. Alert, oriented. Significant hair loss noted in the right temporal area slightly on the left. Lungs clear. Heart regular rate rhythm.  Assessment:  Problem List Items Addressed This Visit      Musculoskeletal and Integument   Hair loss     Other   Prediabetes    Other Visit Diagnoses    Post hysterectomy menopause    -  Primary      Plan:  Meds ordered this encounter  Medications  . medroxyPROGESTERone (PROVERA) 5 MG tablet    Sig: One po qd    Dispense:  30 tablet    Refill:  5    Order Specific Question:  Supervising Provider    Answer:  Mikey Kirschner [[id number]]   Continue topical estrogen. Trial of oral progesterone per patient request. Encourage weight loss and regular activity. Patient is taking daily vitamin D and biotin supplement. Take daily hormone therapy for 3 months and if no significant improvement in her hair loss, patient to call back to our office and we will refer her to specialist at [facility]. Return if symptoms worsen or fail to improve.

## 2015-05-03 ENCOUNTER — Ambulatory Visit: Payer: PRIVATE HEALTH INSURANCE | Admitting: Nurse Practitioner

## 2015-05-11 ENCOUNTER — Encounter: Payer: Self-pay | Admitting: Adult Health

## 2015-05-22 ENCOUNTER — Encounter: Payer: Self-pay | Admitting: Nurse Practitioner

## 2015-05-24 ENCOUNTER — Encounter: Payer: Self-pay | Admitting: Adult Health

## 2015-05-24 ENCOUNTER — Telehealth: Payer: Self-pay | Admitting: Family Medicine

## 2015-05-24 ENCOUNTER — Telehealth: Payer: Self-pay | Admitting: Adult Health

## 2015-05-24 NOTE — Telephone Encounter (Signed)
BP elevated I rec OV with [place] this week and will need meds- bring all meds with her

## 2015-05-24 NOTE — Telephone Encounter (Signed)
Pt has been having issue with her med from [facility] next door Her BP was high 167/93 was the highest from a med she put her on for  Hormone therapy. Anderson Malta told her to stop the med, recheck her BP periodically Over the next few days an get back with her for the readings.   Patient also sent [name] a Message (which she says she can ignore now that she  Has spoke to [place] an [place]) if we can fwd this to [place] please  Now she is calling here to make sure you're ok with her BP at this point and if  You feel waiting a few days is the appropriate plan of attack. I advised that she should  Try not to panic, to not repeatedly take her pressure because this causes anxiety.   Please advise

## 2015-05-24 NOTE — Telephone Encounter (Signed)
LMRC

## 2015-05-24 NOTE — Telephone Encounter (Signed)
Left message to call me back.

## 2015-05-24 NOTE — Telephone Encounter (Signed)
Pt called stating that she is having High Blood Pressure with the estrogen jennifer has prescribed her. Pt would like to know if she needs something new or can she get something to regulate her blood pressure while taking the medication. Please contact pt

## 2015-05-25 NOTE — Telephone Encounter (Signed)
Transferred to front desk to schedule appointment.  

## 2015-05-26 ENCOUNTER — Encounter: Payer: Self-pay | Admitting: Family Medicine

## 2015-05-26 ENCOUNTER — Ambulatory Visit (INDEPENDENT_AMBULATORY_CARE_PROVIDER_SITE_OTHER): Payer: PRIVATE HEALTH INSURANCE | Admitting: Family Medicine

## 2015-05-26 VITALS — BP 132/90 | Ht 66.0 in | Wt 179.0 lb

## 2015-05-26 DIAGNOSIS — Z780 Asymptomatic menopausal state: Secondary | ICD-10-CM

## 2015-05-26 DIAGNOSIS — L659 Nonscarring hair loss, unspecified: Secondary | ICD-10-CM

## 2015-05-26 DIAGNOSIS — I10 Essential (primary) hypertension: Secondary | ICD-10-CM | POA: Diagnosis not present

## 2015-05-26 MED ORDER — ESTROGENS CONJUGATED 0.625 MG PO TABS: ORAL_TABLET | ORAL | Status: DC

## 2015-05-26 MED ORDER — AMLODIPINE BESYLATE 5 MG PO TABS: 5.0000 mg | ORAL_TABLET | Freq: Every day | ORAL | Status: DC

## 2015-05-26 NOTE — Progress Notes (Signed)
   Subjective:    Patient ID: [name], female    DOB: Dec 25, 1960, 54 y.o.   MRN: [id number]  HPIfollow up on blood pressure. Pt brought in readings.   Pt has concerns about hormones. Hair still falling out. Trouble sleeping. Patient states that she is having severe menopausal symptoms ever since been switch away from the pill version to this spray she states she has 9/hot flashes night sweats feels irritable and has a hard time resting  Concerns about taking collagen. I informed the patient is fine to take this I'm not certain to do any good but it is okay for her to take.  Patient blood pressure readings are moderately elevated. She denies chest tightness pressure pain or headache denies nausea vomiting diarrhea or wheezing or difficulty breathing  Review of Systems     Objective:   Physical Exam  Hair loss is noted in the temporal regions bilaterally as well as thinning of the eyebrows. No masses lungs clear      Assessment & Plan:  HTN-at one time the patient was under good control with amlodipine but then she actually was doing so well medicine. Now blood pressure has returned blood pressure reading would large, moderately elevated 150/92 start amlodipine 5 mg daily first week half tablet daily then after that one tablet daily  Bring in blood pressure cuff to check here and with R wall unit with a large cuff to make sure that is relatively accurate  Significant hearing loss in the temporal area along with the eyebrows recent lab work for thyroid looks good I do not feel this is her Premarin causing this recurrent estrogen is bradycardia is not helping her with her hot flashes and menopausal symptoms therefore restart Premarin daily in addition to this referral to endocrinology for further evaluation possibility of thyroid as well as the possibility of needing additional tests

## 2015-05-26 NOTE — Patient Instructions (Signed)
DASH Eating Plan  DASH stands for "Dietary Approaches to Stop Hypertension." The DASH eating plan is a healthy eating plan that has been shown to reduce high blood pressure (hypertension). Additional health benefits may include reducing the risk of type 2 diabetes mellitus, heart disease, and stroke. The DASH eating plan may also help with weight loss.  WHAT DO I NEED TO KNOW ABOUT THE DASH EATING PLAN?  For the DASH eating plan, you will follow these general guidelines:  · Choose foods with a percent daily value for sodium of less than 5% (as listed on the food label).  · Use salt-free seasonings or herbs instead of table salt or sea salt.  · Check with your health care provider or pharmacist before using salt substitutes.  · Eat lower-sodium products, often labeled as "lower sodium" or "no salt added."  · Eat fresh foods.  · Eat more vegetables, fruits, and low-fat dairy products.  · Choose whole grains. Look for the word "whole" as the first word in the ingredient list.  · Choose fish and skinless chicken or turkey more often than red meat. Limit fish, poultry, and meat to 6 oz (170 g) each day.  · Limit sweets, desserts, sugars, and sugary drinks.  · Choose heart-healthy fats.  · Limit cheese to 1 oz (28 g) per day.  · Eat more home-cooked food and less restaurant, buffet, and fast food.  · Limit fried foods.  · Cook foods using methods other than frying.  · Limit canned vegetables. If you do use them, rinse them well to decrease the sodium.  · When eating at a restaurant, ask that your food be prepared with less salt, or no salt if possible.  WHAT FOODS CAN I EAT?  Seek help from a dietitian for individual calorie needs.  Grains  Whole grain or whole wheat bread. Brown rice. Whole grain or whole wheat pasta. Quinoa, bulgur, and whole grain cereals. Low-sodium cereals. Corn or whole wheat flour tortillas. Whole grain cornbread. Whole grain crackers. Low-sodium crackers.  Vegetables  Fresh or frozen vegetables  (raw, steamed, roasted, or grilled). Low-sodium or reduced-sodium tomato and vegetable juices. Low-sodium or reduced-sodium tomato sauce and paste. Low-sodium or reduced-sodium canned vegetables.   Fruits  All fresh, canned (in natural juice), or frozen fruits.  Meat and Other Protein Products  Ground beef (85% or leaner), grass-fed beef, or beef trimmed of fat. Skinless chicken or turkey. Ground chicken or turkey. Pork trimmed of fat. All fish and seafood. Eggs. Dried beans, peas, or lentils. Unsalted nuts and seeds. Unsalted canned beans.  Dairy  Low-fat dairy products, such as skim or 1% milk, 2% or reduced-fat cheeses, low-fat ricotta or cottage cheese, or plain low-fat yogurt. Low-sodium or reduced-sodium cheeses.  Fats and Oils  Tub margarines without trans fats. Light or reduced-fat mayonnaise and salad dressings (reduced sodium). Avocado. Safflower, olive, or canola oils. Natural peanut or almond butter.  Other  Unsalted popcorn and pretzels.  The items listed above may not be a complete list of recommended foods or beverages. Contact your dietitian for more options.  WHAT FOODS ARE NOT RECOMMENDED?  Grains  White bread. White pasta. White rice. Refined cornbread. Bagels and croissants. Crackers that contain trans fat.  Vegetables  Creamed or fried vegetables. Vegetables in a cheese sauce. Regular canned vegetables. Regular canned tomato sauce and paste. Regular tomato and vegetable juices.  Fruits  Dried fruits. Canned fruit in light or heavy syrup. Fruit juice.  Meat and Other Protein   Products  Fatty cuts of meat. Ribs, chicken wings, bacon, sausage, bologna, salami, chitterlings, fatback, hot dogs, bratwurst, and packaged luncheon meats. Salted nuts and seeds. Canned beans with salt.  Dairy  Whole or 2% milk, cream, half-and-half, and cream cheese. Whole-fat or sweetened yogurt. Full-fat cheeses or blue cheese. Nondairy creamers and whipped toppings. Processed cheese, cheese spreads, or cheese  curds.  Condiments  Onion and garlic salt, seasoned salt, table salt, and sea salt. Canned and packaged gravies. Worcestershire sauce. Tartar sauce. Barbecue sauce. Teriyaki sauce. Soy sauce, including reduced sodium. Steak sauce. Fish sauce. Oyster sauce. Cocktail sauce. Horseradish. Ketchup and mustard. Meat flavorings and tenderizers. Bouillon cubes. Hot sauce. Tabasco sauce. Marinades. Taco seasonings. Relishes.  Fats and Oils  Butter, stick margarine, lard, shortening, ghee, and bacon fat. Coconut, palm kernel, or palm oils. Regular salad dressings.  Other  Pickles and olives. Salted popcorn and pretzels.  The items listed above may not be a complete list of foods and beverages to avoid. Contact your dietitian for more information.  WHERE CAN I FIND MORE INFORMATION?  National Heart, Lung, and [employer] Institute: www.nhlbi.nih.gov/health/health-topics/topics/dash/     This information is not intended to replace advice given to you by your health care provider. Make sure you discuss any questions you have with your health care provider.     Document Released: 05/25/2011 Document Revised: 06/26/2014 Document Reviewed: 04/09/2013  Elsevier Interactive Patient Education ©2016 Elsevier Inc.

## 2015-05-28 ENCOUNTER — Encounter: Payer: Self-pay | Admitting: Family Medicine

## 2015-05-31 ENCOUNTER — Encounter: Payer: Self-pay | Admitting: Family Medicine

## 2015-06-02 ENCOUNTER — Ambulatory Visit: Payer: PRIVATE HEALTH INSURANCE | Admitting: Adult Health

## 2015-06-02 ENCOUNTER — Ambulatory Visit: Payer: PRIVATE HEALTH INSURANCE | Admitting: *Deleted

## 2015-06-02 VITALS — BP 126/84

## 2015-06-02 DIAGNOSIS — I10 Essential (primary) hypertension: Secondary | ICD-10-CM

## 2015-06-02 NOTE — Progress Notes (Signed)
   Subjective:    Patient ID: [name], female    DOB: 1961-03-21, 54 y.o.   MRN: [id number]  HPI  Patient arrives to recheck her blood pressure. BP-126/84-consult with Dr Nicki Reaper. Dr Nicki Reaper recommends to continue current meds and follow up with him in 6 weeks. Discussed with patient. Patient verbalized understanding.  Review of Systems     Objective:   Physical Exam        Assessment & Plan:

## 2015-06-03 ENCOUNTER — Encounter: Payer: Self-pay | Admitting: Family Medicine

## 2015-06-03 ENCOUNTER — Ambulatory Visit (HOSPITAL_COMMUNITY)
Admission: RE | Admit: 2015-06-03 | Discharge: 2015-06-03 | Disposition: A | Discharge status: Home / Self Care | Payer: PRIVATE HEALTH INSURANCE | Source: Ambulatory Visit | Attending: Family Medicine | Admitting: Family Medicine

## 2015-06-03 ENCOUNTER — Ambulatory Visit (INDEPENDENT_AMBULATORY_CARE_PROVIDER_SITE_OTHER): Payer: PRIVATE HEALTH INSURANCE | Admitting: Family Medicine

## 2015-06-03 VITALS — BP 130/80 | Ht 66.0 in | Wt 180.4 lb

## 2015-06-03 DIAGNOSIS — M779 Enthesopathy, unspecified: Secondary | ICD-10-CM | POA: Insufficient documentation

## 2015-06-03 DIAGNOSIS — M25562 Pain in left knee: Secondary | ICD-10-CM

## 2015-06-03 MED ORDER — ETODOLAC 400 MG PO TABS: 400.0000 mg | ORAL_TABLET | Freq: Two times a day (BID) | ORAL | Status: DC

## 2015-06-03 NOTE — Progress Notes (Signed)
   Subjective:    Patient ID: [name], female    DOB: 10-03-1960, 54 y.o.   MRN: [id number]  HPI Patient arrives with c/o left knee pain for 3 days. Patient has tried advil, icy hot and knee brace.  Trouble putting weight on it t  Took ibuprofen and didn't help much   Used knee brace  feels tight  No major incr in exdrcise  No prior difficulties with knee  Review of Systems No hip pain no ankle pain no rash no fever ROS otherwise negative    Objective:   Physical Exam  Alert vitals stable. Lungs clear heart rare rhythm left knee slight crepitations. Mild effusion good range of motion no joint laxity no point tenderness      Assessment & Plan:  Impression flare of knee pain possible underlying osteoarthritis plan local measures discussed and uncomfortable medicine prescribed x-ray further recommendations based results [facility]

## 2015-07-14 ENCOUNTER — Encounter: Payer: Self-pay | Admitting: Family Medicine

## 2015-07-14 ENCOUNTER — Ambulatory Visit (INDEPENDENT_AMBULATORY_CARE_PROVIDER_SITE_OTHER): Payer: PRIVATE HEALTH INSURANCE | Admitting: Family Medicine

## 2015-07-14 VITALS — BP 128/80 | Ht 66.0 in | Wt 182.0 lb

## 2015-07-14 DIAGNOSIS — L659 Nonscarring hair loss, unspecified: Secondary | ICD-10-CM

## 2015-07-14 DIAGNOSIS — I10 Essential (primary) hypertension: Secondary | ICD-10-CM

## 2015-07-14 NOTE — Progress Notes (Signed)
   Subjective:    Patient ID: [name], female    DOB: Jul 12, 1960, 55 y.o.   MRN: [id number]  Hypertension This is a chronic problem. The current episode started more than 1 year ago. The problem has been gradually improving since onset. The problem is controlled. There are no associated agents to hypertension. There are no known risk factors for coronary artery disease. Treatments tried: amlodipine. The current treatment provides moderate improvement. There are no compliance problems.    Patient has no concerns at this time.    Review of Systems  no chest tightness pressure pain nausea vomiting diarrhea fever chills sweats.    Objective:   Physical Exam  neck no masses lungs clear no crackles respiratory rate normal heart regular no murmur extremities no edema blood pressure good on recheck       Assessment & Plan:   [facility]  HTN good control continue current measures. Follow-up 6 months follow-up sooner process.

## 2015-07-19 ENCOUNTER — Encounter: Payer: Self-pay | Admitting: Family Medicine

## 2015-07-20 ENCOUNTER — Encounter: Payer: Self-pay | Admitting: Family Medicine

## 2015-07-20 DIAGNOSIS — R7989 Other specified abnormal findings of blood chemistry: Secondary | ICD-10-CM

## 2015-07-20 DIAGNOSIS — L659 Nonscarring hair loss, unspecified: Secondary | ICD-10-CM

## 2015-07-27 NOTE — Telephone Encounter (Signed)
Nurse's-please check ferritin level. Also please check with lab core if they are able to check zinc level and vitamin A level if so please check those is well. Once all of this is set up please notify the patient thank you

## 2015-07-27 NOTE — Addendum Note (Signed)
Addended by: Ofilia Neas R on: 07/27/2015 03:04 PM   Modules accepted: Orders

## 2015-07-30 ENCOUNTER — Ambulatory Visit: Payer: PRIVATE HEALTH INSURANCE | Admitting: Nurse Practitioner

## 2015-08-03 LAB — ZINC: Zinc: 94 ug/dL (ref 56–134)

## 2015-08-03 LAB — FERRITIN: FERRITIN: 236 ng/mL — AB (ref 15–150)

## 2015-08-03 LAB — VITAMIN A: VITAMIN A: 88 ug/dL — AB (ref 20–65)

## 2015-08-08 ENCOUNTER — Encounter: Payer: Self-pay | Admitting: Family Medicine

## 2015-08-08 DIAGNOSIS — R7989 Other specified abnormal findings of blood chemistry: Secondary | ICD-10-CM | POA: Insufficient documentation

## 2015-08-09 NOTE — Addendum Note (Signed)
Addended by: Carmelina Noun on: 08/09/2015 10:25 AM   Modules accepted: Orders

## 2015-09-03 ENCOUNTER — Ambulatory Visit (INDEPENDENT_AMBULATORY_CARE_PROVIDER_SITE_OTHER): Payer: PRIVATE HEALTH INSURANCE | Admitting: Nurse Practitioner

## 2015-09-03 ENCOUNTER — Encounter: Payer: Self-pay | Admitting: Nurse Practitioner

## 2015-09-03 VITALS — BP 144/86 | Ht 66.0 in | Wt 181.0 lb

## 2015-09-03 DIAGNOSIS — F419 Anxiety disorder, unspecified: Secondary | ICD-10-CM

## 2015-09-03 DIAGNOSIS — F418 Other specified anxiety disorders: Secondary | ICD-10-CM | POA: Diagnosis not present

## 2015-09-03 DIAGNOSIS — R5382 Chronic fatigue, unspecified: Secondary | ICD-10-CM | POA: Diagnosis not present

## 2015-09-03 DIAGNOSIS — F329 Major depressive disorder, single episode, unspecified: Secondary | ICD-10-CM

## 2015-09-03 MED ORDER — ALPRAZOLAM 0.5 MG PO TABS: ORAL_TABLET | ORAL | Status: DC

## 2015-09-03 MED ORDER — CITALOPRAM HYDROBROMIDE 20 MG PO TABS: 20.0000 mg | ORAL_TABLET | Freq: Every day | ORAL | Status: DC

## 2015-09-06 ENCOUNTER — Encounter: Payer: Self-pay | Admitting: Nurse Practitioner

## 2015-09-06 DIAGNOSIS — F32A Depression, unspecified: Secondary | ICD-10-CM | POA: Insufficient documentation

## 2015-09-06 DIAGNOSIS — F418 Other specified anxiety disorders: Secondary | ICD-10-CM | POA: Insufficient documentation

## 2015-09-06 DIAGNOSIS — F419 Anxiety disorder, unspecified: Secondary | ICD-10-CM

## 2015-09-06 DIAGNOSIS — F329 Major depressive disorder, single episode, unspecified: Secondary | ICD-10-CM | POA: Insufficient documentation

## 2015-09-06 DIAGNOSIS — R5382 Chronic fatigue, unspecified: Secondary | ICD-10-CM | POA: Insufficient documentation

## 2015-09-06 NOTE — Progress Notes (Signed)
Subjective:  Presents for recheck. Is now being followed by [facility] in [place] for bioidentical hormones. Has had lab work ordered but no results at this time. Continues to have chronic fatigue. Difficulty sleeping. Weight gain. Recently retired. Overall still very active social life. Will be seeing a specialist at [facility] for chronic hair loss see previous notes. Also feels she has some anxiety and depression.  Objective:   BP 144/86 mmHg  Ht 5\' 6"  (1.676 m)  Wt 181 lb (82.101 kg)  BMI 29.23 kg/m2 NAD. Alert, oriented. Thoughts logical coherent and relevant. Dressed appropriately. Lungs clear. Heart regular rate rhythm.  Assessment:  Problem List Items Addressed This Visit      Other   Anxiety and depression   Chronic fatigue - Primary      Plan:  Meds ordered this encounter  Medications  . citalopram (CELEXA) 20 MG tablet    Sig: Take 1 tablet (20 mg total) by mouth daily.    Dispense:  30 tablet    Refill:  2    Order Specific Question:  Supervising Provider    Answer:  Mikey Kirschner [[id number]]  . ALPRAZolam (XANAX) 0.5 MG tablet    Sig: 1/2 - 1 po BID prn anxiety    Dispense:  30 tablet    Refill:  0    Order Specific Question:  Supervising Provider    Answer:  Maggie Font   Continue follow-up regarding hormonal therapy. Follow-up with [facility] for significant hair loss. Start Celexa daily as directed with Xanax as when necessary med. Cautioned about potential adverse effects. DC med and call if any problems. Return in about 4 weeks (around 10/01/2015) for recheck. Discussed importance of regular activity and issues surrounding retirement.

## 2015-09-17 DIAGNOSIS — I10 Essential (primary) hypertension: Secondary | ICD-10-CM | POA: Insufficient documentation

## 2015-10-04 ENCOUNTER — Ambulatory Visit (INDEPENDENT_AMBULATORY_CARE_PROVIDER_SITE_OTHER): Payer: PRIVATE HEALTH INSURANCE | Admitting: Nurse Practitioner

## 2015-10-04 ENCOUNTER — Encounter: Payer: Self-pay | Admitting: Nurse Practitioner

## 2015-10-04 VITALS — BP 124/80 | Ht 66.0 in | Wt 177.4 lb

## 2015-10-04 DIAGNOSIS — I10 Essential (primary) hypertension: Secondary | ICD-10-CM

## 2015-10-04 DIAGNOSIS — F418 Other specified anxiety disorders: Secondary | ICD-10-CM

## 2015-10-04 DIAGNOSIS — F32A Depression, unspecified: Secondary | ICD-10-CM

## 2015-10-04 DIAGNOSIS — L661 Lichen planopilaris: Secondary | ICD-10-CM | POA: Insufficient documentation

## 2015-10-04 DIAGNOSIS — L669 Cicatricial alopecia, unspecified: Secondary | ICD-10-CM | POA: Insufficient documentation

## 2015-10-04 DIAGNOSIS — F329 Major depressive disorder, single episode, unspecified: Secondary | ICD-10-CM

## 2015-10-04 DIAGNOSIS — F419 Anxiety disorder, unspecified: Secondary | ICD-10-CM

## 2015-10-04 MED ORDER — ESCITALOPRAM OXALATE 10 MG PO TABS: 10.0000 mg | ORAL_TABLET | Freq: Every day | ORAL | Status: DC

## 2015-10-04 NOTE — Progress Notes (Signed)
Subjective:  Presents for recheck. Minimal improvement on Celexa. Seems to increase her fatigue. Would like to try Lexapro. Has taken this well in the past. Seeing specialist at [facility] for her hair loss. Compliant with BP meds. No CP/ischemic type pain or SOB. No edema.   Objective:   BP 124/80 mmHg  Ht 5\' 6"  (1.676 m)  Wt 177 lb 6 oz (80.457 kg)  BMI 28.64 kg/m2 NAD. Alert, oriented. Lungs clear. Heart RRR. Lower extremities no edema.   Assessment:  Problem List Items Addressed This Visit      Cardiovascular and Mediastinum   Essential hypertension, benign - Primary     Other   Anxiety and depression     Plan:  Meds ordered this encounter  Medications                . escitalopram (LEXAPRO) 10 MG tablet    Sig: Take 1 tablet (10 mg total) by mouth daily.    Dispense:  30 tablet    Refill:  2    Order Specific Question:  Supervising Provider    Answer:  Mikey Kirschner [[id number]]    Switch to Lexapro.  Return in about 3 months (around 01/03/2016) for recheck. Call back sooner if needed.

## 2015-10-20 ENCOUNTER — Encounter: Payer: Self-pay | Admitting: Family Medicine

## 2015-10-25 ENCOUNTER — Telehealth: Payer: Self-pay | Admitting: Family Medicine

## 2015-10-25 NOTE — Telephone Encounter (Signed)
Discussed with patient. Patient advised Unfortunately it does not work that way she would need to be seen. Most illnesses that spread from one family member to another are viruses and typically can take anywhere from 3-7 days to clear up. If the patient feels that it is getting worse than a typical virus Dr Nicki Reaper would recommend office visit to be seen. Patient verbalized understanding and scheduled office visit for evaluation.

## 2015-10-25 NOTE — Telephone Encounter (Signed)
Unfortunately it does not work that way she would need to be seen. Most illnesses that spread from one family member to another are viruses and typically can take anywhere from 3-7 days to clear up. If the patient feels that it is getting worse than a typical virus I would recommend office visit to be seen.

## 2015-10-25 NOTE — Telephone Encounter (Signed)
Pt states her spouse was seen last week for cough, chest congestion and issued an  Antibiotic Threasa Beards )   She now has the same symptoms, cough, chest congestion Has tried OTC but it won't break it up   Wants to know if you will call her in a an antibiotic as well  [facility]

## 2015-10-26 ENCOUNTER — Ambulatory Visit (INDEPENDENT_AMBULATORY_CARE_PROVIDER_SITE_OTHER): Payer: PRIVATE HEALTH INSURANCE | Admitting: Family Medicine

## 2015-10-26 ENCOUNTER — Encounter: Payer: Self-pay | Admitting: Family Medicine

## 2015-10-26 VITALS — BP 130/86 | Temp 98.3°F | Ht 66.0 in | Wt 178.4 lb

## 2015-10-26 DIAGNOSIS — L661 Lichen planopilaris: Secondary | ICD-10-CM | POA: Diagnosis not present

## 2015-10-26 DIAGNOSIS — J0110 Acute frontal sinusitis, unspecified: Secondary | ICD-10-CM

## 2015-10-26 MED ORDER — AMOXICILLIN-POT CLAVULANATE 875-125 MG PO TABS: 1.0000 | ORAL_TABLET | Freq: Two times a day (BID) | ORAL | Status: DC

## 2015-10-26 NOTE — Progress Notes (Signed)
   Subjective:    Patient ID: [name], female    DOB: 10-Oct-1960, 55 y.o.   MRN: [id number]  Cough This is a new problem. The current episode started in the past 7 days. The cough is non-productive. Associated symptoms include rhinorrhea. Pertinent negatives include no chest pain, ear pain, fever, shortness of breath or wheezing. Associated symptoms comments: Congestion. Treatments tried: Mucinex, Delsym, Claritin , Zyrtec.   patient with about a week of head congestion drainage now sinus pressure pain discomfort  Patient states no other concerns this visit.  Review of Systems  Constitutional: Negative for fever and activity change.  HENT: Positive for congestion and rhinorrhea. Negative for ear pain.   Eyes: Negative for discharge.  Respiratory: Positive for cough. Negative for shortness of breath and wheezing.   Cardiovascular: Negative for chest pain.       Objective:   Physical Exam  Constitutional: She appears well-developed.  HENT:  Head: Normocephalic.  Nose: Nose normal.  Mouth/Throat: Oropharynx is clear and moist. No oropharyngeal exudate.  Neck: Neck supple.  Cardiovascular: Normal rate and normal heart sounds.   No murmur heard. Pulmonary/Chest: Effort normal and breath sounds normal. She has no wheezes.  Lymphadenopathy:    She has no cervical adenopathy.  Skin: Skin is warm and dry.  Nursing note and vitals reviewed.         Assessment & Plan:  Patient was seen today for upper respiratory illness. It is felt that the patient is dealing with sinusitis. Antibiotics were prescribed today. Importance of compliance with medication was discussed. Symptoms should gradually resolve over the course of the next several days. If high fevers, progressive illness, difficulty breathing, worsening condition or failure for symptoms to improve over the next several days then the patient is to follow-up. If any emergent conditions the patient is to follow-up in the  emergency department otherwise to follow-up in the office. Frontal sinusitis should get better with antibiotics

## 2015-12-06 ENCOUNTER — Telehealth: Payer: Self-pay | Admitting: Family Medicine

## 2015-12-06 NOTE — Telephone Encounter (Signed)
Notified patient could be med related but not an easy call over a phone recommend office visit. Transferred to front desk to schedule.

## 2015-12-06 NOTE — Telephone Encounter (Signed)
Pt states the last couple of weeks she has been having feet swelling And legs, if she take a drive like to [place] she can't hardly stand It from the ache. Hard to stand up as well, she wants to know if you think  Its med related or do you need to see her sometime this week for it.   Please advise

## 2015-12-06 NOTE — Telephone Encounter (Signed)
Could be med related but not an easy call over a phone I rec ov

## 2015-12-09 ENCOUNTER — Ambulatory Visit (INDEPENDENT_AMBULATORY_CARE_PROVIDER_SITE_OTHER): Payer: PRIVATE HEALTH INSURANCE | Admitting: Family Medicine

## 2015-12-09 ENCOUNTER — Encounter: Payer: Self-pay | Admitting: Family Medicine

## 2015-12-09 VITALS — BP 124/76 | Ht 66.0 in | Wt 177.0 lb

## 2015-12-09 DIAGNOSIS — E785 Hyperlipidemia, unspecified: Secondary | ICD-10-CM

## 2015-12-09 DIAGNOSIS — I10 Essential (primary) hypertension: Secondary | ICD-10-CM

## 2015-12-09 MED ORDER — HYDROCHLOROTHIAZIDE 12.5 MG PO TABS: 12.5000 mg | ORAL_TABLET | Freq: Every day | ORAL | Status: DC

## 2015-12-09 MED ORDER — POTASSIUM CHLORIDE ER 10 MEQ PO TBCR: 10.0000 meq | EXTENDED_RELEASE_TABLET | Freq: Every day | ORAL | Status: DC

## 2015-12-09 NOTE — Progress Notes (Signed)
   Subjective:    Patient ID: [name], female    DOB: 1960-08-04, 55 y.o.   MRN: [id number]  HPIbilateral leg swelling and pain.  Started 2 weeks ago.  This patient is been on various blood pressure medicines and nothing really seems to stack them regards to affective treatments it's made it very difficult for the patient and we have tried our best. We have tried diuretics for which she did tolerate then tried Ace inhibitors for which it made her feel bad and recently tried calcium channel blocker but having some puffiness in her feet  There has been significant discussion with the patient in the past and she has actually done okay on ace inhibitors and diuretics several years back according to her paper chart   Review of Systems  Constitutional: Negative for activity change, appetite change and fatigue.  HENT: Negative for congestion.   Respiratory: Negative for cough.   Cardiovascular: Positive for leg swelling. Negative for chest pain.  Gastrointestinal: Negative for abdominal pain.  Endocrine: Negative for polydipsia and polyphagia.  Neurological: Negative for weakness.  Psychiatric/Behavioral: Negative for confusion.       Objective:   Physical Exam  Constitutional: She appears well-nourished. No distress.  Cardiovascular: Normal rate, regular rhythm and normal heart sounds.   No murmur heard. Pulmonary/Chest: Effort normal and breath sounds normal. No respiratory distress.  Musculoskeletal: She exhibits edema.  Lymphadenopathy:    She has no cervical adenopathy.  Neurological: She is alert. She exhibits normal muscle tone.  Psychiatric: Her behavior is normal.  Vitals reviewed.         Assessment & Plan:  The patient does have some puffiness in her feet that I believe is related to the amlodipine. Stop amlodipine Use low-dose diuretic Unfortunately patients tried many different blood pressure medicines and nothing seems to do well with her without having side  effects This was talked to at length she agrees to try low-dose diuretic with potassium She will recheck lab work in approximately 2-3 weeks she will follow-up with [name] in July she will send [place] blood pressure readings patient will follow-up sooner problems  Patient is having some intermittent lower leg aching I do not find any evidence of DVT she may use anti-inflammatories when necessary she has a history of Baker's cyst

## 2015-12-16 ENCOUNTER — Other Ambulatory Visit: Payer: Self-pay | Admitting: Nurse Practitioner

## 2016-01-03 ENCOUNTER — Ambulatory Visit (INDEPENDENT_AMBULATORY_CARE_PROVIDER_SITE_OTHER): Payer: PRIVATE HEALTH INSURANCE | Admitting: Nurse Practitioner

## 2016-01-03 ENCOUNTER — Encounter: Payer: Self-pay | Admitting: Nurse Practitioner

## 2016-01-03 VITALS — BP 136/80 | Ht 66.0 in | Wt 177.0 lb

## 2016-01-03 DIAGNOSIS — I10 Essential (primary) hypertension: Secondary | ICD-10-CM

## 2016-01-03 DIAGNOSIS — F419 Anxiety disorder, unspecified: Secondary | ICD-10-CM | POA: Diagnosis not present

## 2016-01-03 MED ORDER — ALPRAZOLAM 0.5 MG PO TABS: ORAL_TABLET | ORAL | Status: DC

## 2016-01-04 ENCOUNTER — Encounter: Payer: Self-pay | Admitting: Nurse Practitioner

## 2016-01-04 NOTE — Progress Notes (Addendum)
Subjective:  Presents for recheck on her blood pressure. Would like to compare her machine to our numbers. Would like to decrease her potassium dose from 10 to 5 mEq. Is currently on HCTZ 12.5 mg for BP and fluid. No chest pain/ischemic type pain or shortness of breath. No edema. Also requesting a refill on her Xanax, rare use of this only when necessary.  Objective:   BP 136/80 mmHg  Ht 5\' 6"  (1.676 m)  Wt 177 lb (80.287 kg)  BMI 28.58 kg/m2 NAD. Alert, oriented. Lungs clear. Heart regular rate rhythm. BP manually is similar to readings from the nurse. This is running about 20 points or more less than what she is getting on her automatic cuff. Lower extremities no edema.  Assessment:  Problem List Items Addressed This Visit      Cardiovascular and Mediastinum   Essential hypertension, benign - Primary    Other Visit Diagnoses    Mild anxiety        Relevant Medications    ALPRAZolam (XANAX) 0.5 MG tablet       Plan:  Meds ordered this encounter  Medications  . ALPRAZolam (XANAX) 0.5 MG tablet    Sig: 1/2 - 1 po BID prn anxiety    Dispense:  30 tablet    Refill:  0    Order Specific Question:  Supervising Provider    Answer:  Maggie Font   Encourage regular activity healthy diet. Given prescription for Xanax to use sparingly for anxiety.Decrease potassium dose to 5 mEq daily. Return in about 3 months (around 04/04/2016) for physical. Call back sooner if any problems.

## 2016-01-31 ENCOUNTER — Ambulatory Visit (INDEPENDENT_AMBULATORY_CARE_PROVIDER_SITE_OTHER): Payer: PRIVATE HEALTH INSURANCE | Admitting: Orthopedic Surgery

## 2016-01-31 ENCOUNTER — Encounter (HOSPITAL_COMMUNITY): Payer: Self-pay

## 2016-01-31 VITALS — BP 131/76 | HR 63 | Resp 16

## 2016-01-31 DIAGNOSIS — M5410 Radiculopathy, site unspecified: Secondary | ICD-10-CM

## 2016-01-31 MED ORDER — GABAPENTIN 300 MG PO CAPS: 300.0000 mg | ORAL_CAPSULE | Freq: Every day | ORAL | 5 refills | Status: DC

## 2016-01-31 MED ORDER — IBUPROFEN 800 MG PO TABS: 800.0000 mg | ORAL_TABLET | Freq: Three times a day (TID) | ORAL | 5 refills | Status: DC | PRN

## 2016-01-31 NOTE — Patient Instructions (Addendum)
START PT L SPINE - [facility] THERAPY DEPT [id number]  START GABAPENTIN  CONTINUE IBUPROFEN   Encounter Diagnosis  Name Primary?  . Back pain with left-sided radiculopathy Yes   Radicular Pain Radicular pain in either the arm or leg is usually from a bulging or herniated disk in the spine. A piece of the herniated disk may press against the nerves as the nerves exit the spine. This causes pain which is felt at the tips of the nerves down the arm or leg. Other causes of radicular pain may include:  Fractures.  Heart disease.  Cancer.  An abnormal and usually degenerative state of the nervous system or nerves (neuropathy). Diagnosis may require CT or MRI scanning to determine the primary cause.  Nerves that start at the neck (nerve roots) may cause radicular pain in the outer shoulder and arm. It can spread down to the thumb and fingers. The symptoms vary depending on which nerve root has been affected. In most cases radicular pain improves with conservative treatment. Neck problems may require physical therapy, a neck collar, or cervical traction. Treatment may take many weeks, and surgery may be considered if the symptoms do not improve.  Conservative treatment is also recommended for sciatica. Sciatica causes pain to radiate from the lower back or buttock area down the leg into the foot. Often there is a history of back problems. Most patients with sciatica are better after 2 to 4 weeks of rest and other supportive care. Short term bed rest can reduce the disk pressure considerably. Sitting, however, is not a good position since this increases the pressure on the disk. You should avoid bending, lifting, and all other activities which make the problem worse. Traction can be used in severe cases. Surgery is usually reserved for patients who do not improve within the first months of treatment. Only take over-the-counter or prescription medicines for pain, discomfort, or fever as directed by your  caregiver. Narcotics and muscle relaxants may help by relieving more severe pain and spasm and by providing mild sedation. Cold or massage can give significant relief. Spinal manipulation is not recommended. It can increase the degree of disc protrusion. Epidural steroid injections are often effective treatment for radicular pain. These injections deliver medicine to the spinal nerve in the space between the protective covering of the spinal cord and back bones (vertebrae). Your caregiver can give you more information about steroid injections. These injections are most effective when given within two weeks of the onset of pain.  You should see your caregiver for follow up care as recommended. A program for neck and back injury rehabilitation with stretching and strengthening exercises is an important part of management.  SEEK IMMEDIATE MEDICAL CARE IF:  You develop increased pain, weakness, or numbness in your arm or leg.  You develop difficulty with bladder or bowel control.  You develop abdominal pain.   This information is not intended to replace advice given to you by your health care provider. Make sure you discuss any questions you have with your health care provider.   Document Released: 07/13/2004 Document Revised: 06/26/2014 Document Reviewed: 12/30/2014 Elsevier Interactive Patient Education [employer].

## 2016-01-31 NOTE — Progress Notes (Signed)
Right leg pain and knee pain  2 weeks  HPI   55 years old complains of right knee and leg pain with pain behind the knee for 2 weeks associated with a bulging mass behind the knee swelling constant ache stiffness and throbbing.  Treatment ice pack lidocaine patch diclofenac gel. It's worse with driving and walking. No trauma. Pain is 10 out of 10 but improving  She denies unexpected weight loss she does have some fatigue denies bowel or bladder function changes no numbness and no progressive weakness. Mild back discomfort as well.  ROS  Past Medical History:  Diagnosis Date  . Current use of estrogen therapy 02/24/2015  . Depressed 04/21/2015  . Gastroesophageal reflux disease   . Hair loss 02/24/2015  . Hot flashes 02/24/2015  . Hypertension     Past Surgical History:  Procedure Laterality Date  . CHOLECYSTECTOMY    . COLONOSCOPY  07/04/2011   sessile polyp in the mid transverse colon/moderate diverticulosis in the sigmoid to descending colon segments/internal hemorrhoids  . ESOPHAGOGASTRODUODENOSCOPY    . retrograde ureteral stone extraction    . Total abdominal hysterectomy with probable salpingo-oophorectomy  2006-8   HEAVY MENSES/? CANCER  . TUBAL LIGATION     Family History  Problem Relation Age of Onset  . Colon cancer Neg Hx   . Colon polyps Neg Hx   . Breast cancer Neg Hx   . Ovarian cancer Neg Hx   . Uterine cancer Neg Hx   . Ulcerative colitis Neg Hx   . Celiac disease Neg Hx   . Crohn's disease Neg Hx   . Diabetes type II Mother   . Coronary artery disease Mother   . Diabetes type II Father   . Coronary artery disease Father   . Hypertension Father   . Diabetes type II Sister   . Kidney disease Sister   . Diabetes Brother   . Thyroid disease Sister   . Hypertension Sister   . Hypertension Sister   . Hypertension Brother    Social History  Substance Use Topics  . Smoking status: Never Smoker  . Smokeless tobacco: Never Used  . Alcohol use No     Current Outpatient Prescriptions:  .  ALPRAZolam (XANAX) 0.5 MG tablet, 1/2 - 1 po BID prn anxiety, Disp: 30 tablet, Rfl: 0 .  aspirin 81 MG tablet, Take 81 mg by mouth as needed. , Disp: , Rfl:  .  BIOTIN PO, Take by mouth daily., Disp: , Rfl:  .  calcium carbonate (OS-CAL) 600 MG TABS tablet, Take 600 mg by mouth daily., Disp: , Rfl:  .  Cholecalciferol (VITAMIN D3) 50000 UNITS CAPS, Take by mouth daily., Disp: , Rfl:  .  escitalopram (LEXAPRO) 10 MG tablet, TAKE 1 TABLET BY MOUTH ONCE A DAY., Disp: 30 tablet, Rfl: 5 .  estrogens, conjugated, (PREMARIN) 0.45 MG tablet, Take 0.45 mg by mouth daily. Take daily for 21 days then do not take for 7 days., Disp: , Rfl:  .  fish oil-omega-3 fatty acids 1000 MG capsule, Take 2 g by mouth daily., Disp: , Rfl:  .  gabapentin (NEURONTIN) 300 MG capsule, Take 1 capsule (300 mg total) by mouth at bedtime., Disp: 90 capsule, Rfl: 5 .  hydrochlorothiazide (HYDRODIURIL) 12.5 MG tablet, Take 1 tablet (12.5 mg total) by mouth daily., Disp: 30 tablet, Rfl: 3 .  ibuprofen (ADVIL,MOTRIN) 800 MG tablet, Take 1 tablet (800 mg total) by mouth every 8 (eight) hours as needed., Disp: 90  tablet, Rfl: 5 .  Multiple Vitamin (MULTIVITAMIN) capsule, Take 1 capsule by mouth daily., Disp: , Rfl:  .  omeprazole (PRILOSEC) 20 MG capsule, Take 20 mg by mouth as needed. , Disp: , Rfl:  .  potassium chloride (K-DUR) 10 MEQ tablet, Take 1 tablet (10 mEq total) by mouth daily., Disp: 30 tablet, Rfl: 3 .  Probiotic Product (ALIGN) 4 MG CAPS, Take by mouth daily., Disp: , Rfl:   BP 131/76   Pulse 63   Resp 16   Physical Exam  Constitutional: She is oriented to person, place, and time. She appears well-developed and well-nourished. No distress.  Cardiovascular: Normal rate and intact distal pulses.   Neurological: She is alert and oriented to person, place, and time. She has normal reflexes. She exhibits normal muscle tone. Coordination normal.  Skin: Skin is warm and dry. No  rash noted. She is not diaphoretic. No erythema. No pallor.  Psychiatric: She has a normal mood and affect. Her behavior is normal. Judgment and thought content normal.    Ortho Exam Lumbar spine tenderness right side of the lower back and midline L5-S1 L4-L5  The right and left lower extremity alignment is normal there is no tenderness including the knee joint she has full passive active range of motion of the knee and hip in the right and left leg with both knees and hips in each leg stable. Strength and muscle tone are normal in the lower extremities as well as skin without rash lesion ulcer or palpable nodules pulse and perfusion normal in each lower extremity with no edema sensation is normal and straight leg raises are negative on the left and right although the right side she does get some tension and increased pain and tightness in the right lower back  ASSESSMENT: My personal interpretation of the images:  No imaging was obtained since the pain is less than 2 weeks and there are no red flags    PLAN Start gabapentin and ibuprofen , start physical therapy return 6 weeks   Arther Abbott, MD 01/31/2016 10:26 AM

## 2016-02-05 ENCOUNTER — Encounter (HOSPITAL_COMMUNITY): Payer: Self-pay | Admitting: *Deleted

## 2016-02-05 ENCOUNTER — Emergency Department (HOSPITAL_COMMUNITY)
Admission: EM | Admit: 2016-02-05 | Discharge: 2016-02-05 | Disposition: A | Discharge status: Home / Self Care | Payer: PRIVATE HEALTH INSURANCE | Attending: Emergency Medicine | Admitting: Emergency Medicine

## 2016-02-05 ENCOUNTER — Emergency Department (HOSPITAL_COMMUNITY): Payer: PRIVATE HEALTH INSURANCE

## 2016-02-05 DIAGNOSIS — Z79899 Other long term (current) drug therapy: Secondary | ICD-10-CM | POA: Insufficient documentation

## 2016-02-05 DIAGNOSIS — M25561 Pain in right knee: Secondary | ICD-10-CM | POA: Insufficient documentation

## 2016-02-05 DIAGNOSIS — Z7982 Long term (current) use of aspirin: Secondary | ICD-10-CM | POA: Insufficient documentation

## 2016-02-05 DIAGNOSIS — I10 Essential (primary) hypertension: Secondary | ICD-10-CM | POA: Insufficient documentation

## 2016-02-05 DIAGNOSIS — Z791 Long term (current) use of non-steroidal anti-inflammatories (NSAID): Secondary | ICD-10-CM | POA: Diagnosis not present

## 2016-02-05 MED ORDER — DIPHENHYDRAMINE HCL 12.5 MG/5ML PO ELIX
12.5000 mg | ORAL_SOLUTION | Freq: Once | ORAL | Status: AC
  Administered 2016-02-05: 12.5 mg via ORAL
  Filled 2016-02-05: qty 5

## 2016-02-05 MED ORDER — OXYCODONE-ACETAMINOPHEN 5-325 MG PO TABS
1.0000 | ORAL_TABLET | Freq: Once | ORAL | Status: AC
  Administered 2016-02-05: 1 via ORAL
  Filled 2016-02-05: qty 1

## 2016-02-05 MED ORDER — DIPHENHYDRAMINE HCL 25 MG PO CAPS: 25.0000 mg | ORAL_CAPSULE | Freq: Once | ORAL | Status: DC

## 2016-02-05 MED ORDER — TRAMADOL HCL 50 MG PO TABS: 50.0000 mg | ORAL_TABLET | Freq: Four times a day (QID) | ORAL | 0 refills | Status: DC | PRN

## 2016-02-05 NOTE — ED Triage Notes (Signed)
Pt reports she was drying her car off today and she heard a pop in her right knee. Pt states she bought a knee brace and has taken 600 mg ibuprofen without relief.

## 2016-02-05 NOTE — Discharge Instructions (Signed)
Call Dr. Ruthe Mannan office to arrange a follow-up appt. In few days if the pain is not improving

## 2016-02-07 NOTE — ED Provider Notes (Signed)
[facility] DEPT Provider Note   CSN: [id number] Arrival date & time: 02/05/16  2051     History   Chief Complaint Chief Complaint  Patient presents with  . Knee Pain    HPI [name] is a 55 y.o. female.  HPI   [name] is a 55 y.o. female who presents to the Emergency Department complaining of sudden onset of right knee pain that occurred shortly before ER arrival.  She states that she was cleaning her car when she felt a "pop" to her knee and has been having pain since that time.  Pain is worse with movement, especially bending her knee.  She took ibuprofen without relief.  She denies fall, swelling, redness and calf pain.     Past Medical History:  Diagnosis Date  . Current use of estrogen therapy 02/24/2015  . Depressed 04/21/2015  . Gastroesophageal reflux disease   . Hair loss 02/24/2015  . Hot flashes 02/24/2015  . Hypertension     Patient Active Problem List   Diagnosis Date Noted  . 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 Anxiety and depression 09/06/2015  . Chronic fatigue 09/06/2015  . Elevated ferritin 08/08/2015  . Prediabetes 04/30/2015  . Depressed 04/21/2015  . Current use of estrogen therapy 02/24/2015  . Hair loss 02/24/2015  . Hot flashes 02/24/2015  . Peripheral edema 11/19/2014  . Essential hypertension, benign 10/25/2012  . Epigastric abdominal pain 11/01/2011  . Abdominal pain 06/29/2011    Past Surgical History:  Procedure Laterality Date  . CHOLECYSTECTOMY    . COLONOSCOPY  07/04/2011   sessile polyp in the mid transverse colon/moderate diverticulosis in the sigmoid to descending colon segments/internal hemorrhoids  . ESOPHAGOGASTRODUODENOSCOPY    . retrograde ureteral stone extraction    . Total abdominal hysterectomy with probable salpingo-oophorectomy  2006-8   HEAVY MENSES/? CANCER  . TUBAL LIGATION      OB History    Gravida Para Term Preterm AB Living   2 2       2    SAB TAB  Ectopic Multiple Live Births                   Home Medications    Prior to Admission medications   Medication Sig Start Date End Date Taking? Authorizing Provider  gabapentin (NEURONTIN) 300 MG capsule Take 1 capsule (300 mg total) by mouth at bedtime. 01/31/16  Yes Carole Civil, MD  ibuprofen (ADVIL,MOTRIN) 800 MG tablet Take 1 tablet (800 mg total) by mouth every 8 (eight) hours as needed. 01/31/16  Yes Carole Civil, MD  ALPRAZolam Duanne Moron) 0.5 MG tablet 1/2 - 1 po BID prn anxiety 01/03/16   Nilda Simmer, NP  aspirin 81 MG tablet Take 81 mg by mouth as needed.     Historical Provider, MD  BIOTIN PO Take by mouth daily.    Historical Provider, MD  calcium carbonate (OS-CAL) 600 MG TABS tablet Take 600 mg by mouth daily.    Historical Provider, MD  Cholecalciferol (VITAMIN D3) 50000 UNITS CAPS Take by mouth daily.    Historical Provider, MD  escitalopram (LEXAPRO) 10 MG tablet TAKE 1 TABLET BY MOUTH ONCE A DAY. 12/16/15   Nilda Simmer, NP  estrogens, conjugated, (PREMARIN) 0.45 MG tablet Take 0.45 mg by mouth daily. Take daily for 21 days then do not take for 7 days.    Historical Provider, MD  fish oil-omega-3 fatty acids 1000 MG capsule  Take 2 g by mouth daily.    Historical Provider, MD  hydrochlorothiazide (HYDRODIURIL) 12.5 MG tablet Take 1 tablet (12.5 mg total) by mouth daily. 12/09/15   Kathyrn Drown, MD  Multiple Vitamin (MULTIVITAMIN) capsule Take 1 capsule by mouth daily.    Historical Provider, MD  omeprazole (PRILOSEC) 20 MG capsule Take 20 mg by mouth as needed.     Historical Provider, MD  potassium chloride (K-DUR) 10 MEQ tablet Take 1 tablet (10 mEq total) by mouth daily. 12/09/15   Kathyrn Drown, MD  Probiotic Product (ALIGN) 4 MG CAPS Take by mouth daily.    Historical Provider, MD  traMADol (ULTRAM) 50 MG tablet Take 1 tablet (50 mg total) by mouth every 6 (six) hours as needed. 02/05/16   Zenovia Justman, PA-C    Family History Family History    Problem Relation Age of Onset  . Diabetes type II Mother   . Coronary artery disease Mother   . Diabetes type II Father   . Coronary artery disease Father   . Hypertension Father   . Diabetes type II Sister   . Kidney disease Sister   . Diabetes Brother   . Thyroid disease Sister   . Hypertension Sister   . Hypertension Sister   . Hypertension Brother   . Colon cancer Neg Hx   . Colon polyps Neg Hx   . Breast cancer Neg Hx   . Ovarian cancer Neg Hx   . Uterine cancer Neg Hx   . Ulcerative colitis Neg Hx   . Celiac disease Neg Hx   . Crohn's disease Neg Hx     Social History Social History  Substance Use Topics  . Smoking status: Never Smoker  . Smokeless tobacco: Never Used  . Alcohol use No     Allergies   Hydrocodone; Lisinopril; Metronidazole; and Norvasc [amlodipine besylate]   Review of Systems Review of Systems  Constitutional: Negative for chills and fever.  Genitourinary: Negative for difficulty urinating and dysuria.  Musculoskeletal: Positive for arthralgias (right knee pain). Negative for joint swelling.  Skin: Negative for color change and wound.  All other systems reviewed and are negative.    Physical Exam Updated Vital Signs BP 145/74 (BP Location: Left Arm)   Pulse 73   Temp 98.3 F (36.8 C) (Oral)   Resp 16   Ht 5\' 6"  (1.676 m)   Wt 78.9 kg   SpO2 99%   BMI 28.08 kg/m   Physical Exam  Constitutional: She is oriented to person, place, and time. She appears well-developed and well-nourished. No distress.  Cardiovascular: Normal rate, regular rhythm and intact distal pulses.   Pulmonary/Chest: Effort normal and breath sounds normal.  Musculoskeletal: She exhibits tenderness.  ttp of the anterior right knee.  No erythema, excessive warmth, effusion, or step-off deformity.  DP pulse brisk, distal sensation intact. Calf is soft and NT.  Neurological: She is alert and oriented to person, place, and time. She exhibits normal muscle tone.  Coordination normal.  Skin: Skin is warm and dry. No erythema.  Nursing note and vitals reviewed.    ED Treatments / Results  Labs (all labs ordered are listed, but only abnormal results are displayed) Labs Reviewed - No data to display  EKG  EKG Interpretation None       Radiology Dg Knee Complete 4 Views Right  Result Date: 02/05/2016 CLINICAL DATA:  Right knee injury while working on car today. Initial encounter. EXAM: RIGHT KNEE - COMPLETE  4+ VIEW COMPARISON:  None. FINDINGS: No evidence of fracture, dislocation, or joint effusion. Bony excrescence from the lateral trochlea projecting superiorly, with corticomedullary differentiation. Superior patellar osteophytes. IMPRESSION: 1. No acute finding. 2. Bony excrescence from the lateral trochlea consistent with osteochondroma. Electronically Signed   By: Monte Fantasia M.D.   On: 02/05/2016 21:44    Procedures Procedures (including critical care time)  Medications Ordered in ED Medications  oxyCODONE-acetaminophen (PERCOCET/ROXICET) 5-325 MG per tablet 1 tablet (1 tablet Oral Given 02/05/16 2200)  diphenhydrAMINE (BENADRYL) 12.5 MG/5ML elixir 12.5 mg (12.5 mg Oral Given 02/05/16 2217)     Initial Impression / Assessment and Plan / ED Course  I have reviewed the triage vital signs and the nursing notes.  Pertinent labs & imaging results that were available during my care of the patient were reviewed by me and considered in my medical decision making (see chart for details).  Clinical Course    Pt with right knee pain.  No concerning sx's for septic joint.  NV intact.  Pt agrees to orthopedic f/u, referral info given.  Knee immobilizer applied for comfort use   Final Clinical Impressions(s) / ED Diagnoses   Final diagnoses:  Right knee pain    New Prescriptions Discharge Medication List as of 02/05/2016 10:17 PM    START taking these medications   Details  traMADol (ULTRAM) 50 MG tablet Take 1 tablet (50 mg  total) by mouth every 6 (six) hours as needed., Starting Sat 02/05/2016, Print         Arabia Nylund [place], PA-C 02/07/16 1536    Pattricia Boss, MD 02/10/16 1148

## 2016-02-10 ENCOUNTER — Ambulatory Visit (HOSPITAL_COMMUNITY): Payer: PRIVATE HEALTH INSURANCE | Attending: Orthopedic Surgery | Admitting: Physical Therapy

## 2016-02-10 ENCOUNTER — Encounter (HOSPITAL_COMMUNITY): Payer: Self-pay | Admitting: Physical Therapy

## 2016-02-10 DIAGNOSIS — R29898 Other symptoms and signs involving the musculoskeletal system: Secondary | ICD-10-CM | POA: Diagnosis present

## 2016-02-10 DIAGNOSIS — R293 Abnormal posture: Secondary | ICD-10-CM | POA: Insufficient documentation

## 2016-02-10 DIAGNOSIS — M6281 Muscle weakness (generalized): Secondary | ICD-10-CM | POA: Diagnosis present

## 2016-02-10 DIAGNOSIS — M545 Low back pain: Secondary | ICD-10-CM | POA: Diagnosis present

## 2016-02-10 NOTE — Therapy (Addendum)
[facility] [place], [place], [address] Phone: [phone]   Fax:  [phone]  Physical Therapy Evaluation/discharge  Patient Details  Name: ABRIANA SALTOS MRN: [id number] Date of Birth: 10/22/1960 Referring Provider: Arther Abbott, MD  Encounter Date: 02/10/2016      PT End of Session - 02/10/16 1442    Visit Number 1   Number of Visits 13   Date for PT Re-Evaluation 03/02/16   Authorization Type [employer]    Authorization Time Period 02/10/16 to 03/24/16 (pending medicaid)   PT Start Time 1301   PT Stop Time 1344   PT Time Calculation (min) 43 min   Activity Tolerance Patient tolerated treatment well   Behavior During Therapy [facility] for tasks assessed/performed      Past Medical History:  Diagnosis Date  . Current use of estrogen therapy 02/24/2015  . Depressed 04/21/2015  . Gastroesophageal reflux disease   . Hair loss 02/24/2015  . Hot flashes 02/24/2015  . Hypertension     Past Surgical History:  Procedure Laterality Date  . CHOLECYSTECTOMY    . COLONOSCOPY  07/04/2011   sessile polyp in the mid transverse colon/moderate diverticulosis in the sigmoid to descending colon segments/internal hemorrhoids  . ESOPHAGOGASTRODUODENOSCOPY    . retrograde ureteral stone extraction    . Total abdominal hysterectomy with probable salpingo-oophorectomy  2006-8   HEAVY MENSES/? CANCER  . TUBAL LIGATION      There were no vitals filed for this visit.       Subjective Assessment - 02/10/16 1308    Subjective Pt reports initially having issues with LLE aching ~55moago and ~2 weeks afterwards, she noticed the same pain in her RLE. She went to see Dr. HAline Brochurewho referred her to PT for management of bulging disc. Her LLE seemed to go away ~1 month ago on its own.    Pertinent History HTN, GERD   Limitations Walking   How long can you sit comfortably? unlimited    How long can you stand comfortably? 5 minutes    How long can  you walk comfortably? unable to walk more than short community distances (car to store, etc.)   Diagnostic tests Xray of B knees: Lt baker's cyst   Patient Stated Goals decrease pain    Currently in Pain? Yes   Pain Score 8   Worst: 8/10, Best: 0/10   Pain Location Knee   Pain Orientation Right   Pain Descriptors / Indicators Aching   Pain Type Acute pain   Pain Radiating Towards Rt buttock down to the ankle    Pain Onset More than a month ago   Pain Frequency Constant   Aggravating Factors  nothing   Pain Relieving Factors motrin, nothing else has helped alleviate the pain    Effect of Pain on Daily Activities limited exercise and ADLs   Multiple Pain Sites No            [facility] PT Assessment - 02/10/16 0001      Assessment   Medical Diagnosis Back pain with Lt side radiculopathy   Referring Provider SArther Abbott MD   Next MD Visit 02/28/16   Prior Therapy None      Precautions   Precautions None     Restrictions   Weight Bearing Restrictions No     Balance Screen   Has the patient fallen in the past 6 months No   Has the patient had a decrease in activity level  because of a fear of falling?  No   Is the patient reluctant to leave their home because of a fear of falling?  No     Home Ecologist residence   Living Arrangements Spouse/significant other   Additional Comments 2 STE      Prior Function   Level of Independence Independent   Vocation Retired     Associate Professor   Overall Cognitive Status Within Functional Limits for tasks assessed     Observation/Other Assessments   Observations Pt rubbing Rt knee throughout session      Sensation   Light Touch Appears Intact     ROM / Strength   AROM / PROM / Strength AROM;Strength     AROM   AROM Assessment Site Lumbar   Lumbar Flexion repeated flexion: pain down Rt leg to ankle   Lumbar Extension repeated ext: pain at end range and down the just below the knee.    Lumbar -  Right Side Bend WNL   Lumbar - Left Side Bend slightly limited, pull Rt side   Lumbar - Right Rotation WNL   Lumbar - Left Rotation WNL     Strength   Strength Assessment Site Knee;Hip;Ankle   Right/Left Hip Right;Left   Right Hip Flexion 4+/5   Right Hip Extension 3/5   Right Hip ABduction 3+/5   Left Hip Flexion 4+/5   Left Hip ABduction 3+/5   Right/Left Knee Right;Left   Right Knee Flexion 3+/5   Right Knee Extension 4+/5   Left Knee Flexion 4/5   Left Knee Extension 5/5   Right/Left Ankle Right;Left   Right Ankle Dorsiflexion 5/5   Left Ankle Dorsiflexion 5/5     Flexibility   Soft Tissue Assessment /Muscle Length yes   Hamstrings Rt: 40 deg, Lt: 25 deg    Quadriceps 25% limited B    Piriformis Rt: 50% limited (reproduced pain), Lt: WNL      Palpation   Spinal mobility L4/L5/S1 tender with spring testing    SI assessment  (-) SI jt provocation testing    Palpation comment TTP along Rt QL/lumbar paraspinals, Rt piriformis, hamstrings, ITB, glute med      Special Tests    Special Tests Lumbar   Lumbar Tests FABER test;Slump Test;Prone Knee Bend Test;Straight Leg Raise     FABER test   findings Negative   Comment BLE     Slump test   Findings Positive   Side Right     Prone Knee Bend Test   Findings Negative   Comment BLE     Straight Leg Raise   Findings Positive   Side  Right   Comment At ~50 deg                    [facility] Adult PT Treatment/Exercise - 02/10/16 0001      Exercises   Exercises Lumbar     Lumbar Exercises: Prone   Other Prone Lumbar Exercises REIL x15 reps,   Other Prone Lumbar Exercises REIL x10 reps with over pressure at L5   possible centralization of symptoms to low back only                 PT Education - 02/10/16 1436    Education provided Yes   Education Details eval findings/POC; nerve pain as possible source of symptoms; encouraged pt to keep an eye on recent knee injury and notify MD if it continues to  persist; HEP initiated    Person(s) Educated Patient   Methods Explanation;Demonstration;Handout   Comprehension Verbalized understanding;Returned demonstration          PT Short Term Goals - 02/10/16 1500      PT SHORT TERM GOAL #1   Title Pt will demo consistency and independence with her HEP to improve LE strength and flexibility.    Time 2   Period Weeks     PT SHORT TERM GOAL #2   Title Pt will demo pain free lumbar ROM in all directions to improve tolerance to daily acitivities.    Time 4   Period Weeks   Status New     PT SHORT TERM GOAL #3   Title Pt will report decrease in max pain report to no greater than 5/10 on VAS to allow her to perform daily household activities.    Time 3   Period Weeks   Status New           PT Long Term Goals - 02/10/16 1502      PT LONG TERM GOAL #1   Title Pt will report no more than 2/10 pain with return to jogging atleast 15 minutes, to allow her to resume her normal fitness routine.    Time 6   Period Weeks   Status New     PT LONG TERM GOAL #2   Title Pt will demo proper lifting technique without cuing from the therapist, x5 trials, to decrease her risk of injury during daily tasks.   Time 6   Period Weeks   Status New     PT LONG TERM GOAL #3   Title Pt will demo improved BLE strength to atleast 4/5 to improve safety with functional mobility.    Time 6   Period Weeks   Status New               Plan - 02/10/16 1448    Clinical Impression Statement Pt is a pleasant 55yo F referred to OPPT for evaluation and management of low back pain with radiculopathy. She presents with pain in her low back and bilateral knees (Rt>Lt) as well as limited strength, flexibility and mobility impairing her tolerance to daily activity and leisure activity. Special testing for SIJ involvement was all negative, but testing for possible low back involvement was positive, evident when pain appeared to centralize with repeated extension in  standing and prone positions. Also noting (+) slump test and (+) PSLR indicating possible nerve involvement as well. Pt is recently retired and would benefit from skilled PT to address her impairments and prevent further activity limitation. I reviewed eval findings and POC with pt who is in agreement with proposed PT frequency.    Rehab Potential Good   Clinical Impairments Affecting Rehab Potential (+) sub acute nature of symptoms    PT Frequency 2x / week   PT Duration 6 weeks   PT Treatment/Interventions ADLs/Self Care Home Management;Biofeedback;Aquatic Therapy;Gait training;Stair training;Therapeutic activities;Therapeutic exercise;Patient/family education;Functional mobility training;Neuromuscular re-education;Balance training;Manual techniques;Dry needling;Passive range of motion   PT Next Visit Plan reassess centralization of repeated extension in prone; hip extensor/abductor strength; sciatic nerve glide; begin trunk stabilization with ab sets, etc.    PT Home Exercise Plan repeated extension in prone; need to update next visit    Recommended Other Services none    Consulted and Agree with Plan of Care Patient      Patient will benefit from skilled therapeutic intervention in order to improve the  following deficits and impairments:  Decreased mobility, Decreased range of motion, Decreased strength, Difficulty walking, Hypomobility, Increased muscle spasms, Impaired flexibility, Pain, Improper body mechanics  Visit Diagnosis: Midline low back pain, with sciatica presence unspecified  Muscle weakness (generalized)  Abnormal posture  Other symptoms and signs involving the musculoskeletal system     Problem List Patient Active Problem List   Diagnosis Date Noted  . 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 Lichen planopilaris [id number]/9379  . Anxiety and depression 09/06/2015  . Chronic fatigue 09/06/2015  . Elevated ferritin 08/08/2015  . Prediabetes 04/30/2015  . Depressed  04/21/2015  . Current use of estrogen therapy 02/24/2015  . Hair loss 02/24/2015  . Hot flashes 02/24/2015  . Peripheral edema 11/19/2014  . Essential hypertension, benign 10/25/2012  . Epigastric abdominal pain 11/01/2011  . Abdominal pain 06/29/2011   3:07 PM,02/10/16 Elly Modena PT, DPT [facility] [address] [place], [place], [address] Phone: [phone]   Fax:  [phone]  Name: SHARI NATT MRN: [id number] Date of Birth: Nov 21, 1960   PHYSICAL THERAPY DISCHARGE SUMMARY  Visits from Start of Care: 1  Current functional level related to goals / functional outcomes: See above for more details   Remaining deficits: See above for more details   Education / Equipment: See above for more details Plan: Patient agrees to discharge.  Patient goals were not met. Patient is being discharged due to the patient's request.  ?????    Pt started a new job and requesting d/c because she is unable to make her appointments.  3:03 PM,08/22/16 Elly Modena PT, [facility] [phone]

## 2016-02-15 ENCOUNTER — Telehealth (HOSPITAL_COMMUNITY): Payer: Self-pay

## 2016-02-15 ENCOUNTER — Ambulatory Visit (HOSPITAL_COMMUNITY): Payer: PRIVATE HEALTH INSURANCE | Admitting: Physical Therapy

## 2016-02-15 NOTE — Telephone Encounter (Signed)
8/29 patient left [place] a message saying to cx all appts.  She has a new job and wouldn't be able to keep these.

## 2016-02-17 ENCOUNTER — Encounter (HOSPITAL_COMMUNITY): Payer: PRIVATE HEALTH INSURANCE

## 2016-02-22 ENCOUNTER — Encounter (HOSPITAL_COMMUNITY): Payer: PRIVATE HEALTH INSURANCE | Admitting: Physical Therapy

## 2016-02-25 ENCOUNTER — Encounter (HOSPITAL_COMMUNITY): Payer: PRIVATE HEALTH INSURANCE | Admitting: Physical Therapy

## 2016-02-28 ENCOUNTER — Ambulatory Visit: Payer: PRIVATE HEALTH INSURANCE | Admitting: Orthopedic Surgery

## 2016-02-29 ENCOUNTER — Encounter (HOSPITAL_COMMUNITY): Payer: PRIVATE HEALTH INSURANCE

## 2016-03-02 ENCOUNTER — Encounter (HOSPITAL_COMMUNITY): Payer: PRIVATE HEALTH INSURANCE

## 2016-03-06 ENCOUNTER — Ambulatory Visit (INDEPENDENT_AMBULATORY_CARE_PROVIDER_SITE_OTHER): Payer: PRIVATE HEALTH INSURANCE | Admitting: Orthopedic Surgery

## 2016-03-06 ENCOUNTER — Encounter: Payer: Self-pay | Admitting: Orthopedic Surgery

## 2016-03-06 VITALS — Ht 65.0 in | Wt 174.0 lb

## 2016-03-06 DIAGNOSIS — S83241A Other tear of medial meniscus, current injury, right knee, initial encounter: Secondary | ICD-10-CM

## 2016-03-06 DIAGNOSIS — M5410 Radiculopathy, site unspecified: Secondary | ICD-10-CM

## 2016-03-06 NOTE — Progress Notes (Signed)
Patient ID: [name], female   DOB: 02-01-1961, 55 y.o.   MRN: [id number]  Chief Complaint  Patient presents with  . Follow-up    left leg/back    HPI [name] is a 55 y.o. female.  This was a scheduled follow-up for lower back pain and left leg pain which has resolved. The patient did not tolerate gabapentin  She actually twisted her right knee heard a loud pop since that time she cannot straighten the leg fully can walk on it normally she is having pain and swelling over the medial compartment Mechanical symptoms as noted below  Review of Systems Review of Systems 1. No radicular symptoms 2. Catching and locking of the right knee   Past Medical History:  Diagnosis Date  . Current use of estrogen therapy 02/24/2015  . Depressed 04/21/2015  . Gastroesophageal reflux disease   . Hair loss 02/24/2015  . Hot flashes 02/24/2015  . Hypertension     Past Surgical History:  Procedure Laterality Date  . CHOLECYSTECTOMY    . COLONOSCOPY  07/04/2011   sessile polyp in the mid transverse colon/moderate diverticulosis in the sigmoid to descending colon segments/internal hemorrhoids  . ESOPHAGOGASTRODUODENOSCOPY    . retrograde ureteral stone extraction    . Total abdominal hysterectomy with probable salpingo-oophorectomy  2006-8   HEAVY MENSES/? CANCER  . TUBAL LIGATION      Social History Social History  Substance Use Topics  . Smoking status: Never Smoker  . Smokeless tobacco: Never Used  . Alcohol use No    Allergies  Allergen Reactions  . Hydrocodone Itching  . Lisinopril     Headache  Dizzy   . Metronidazole Hives  . Norvasc [Amlodipine Besylate]     Current Meds  Medication Sig  . ALPRAZolam (XANAX) 0.5 MG tablet 1/2 - 1 po BID prn anxiety  . aspirin 81 MG tablet Take 81 mg by mouth as needed.   Marland Kitchen BIOTIN PO Take by mouth daily.  . calcium carbonate (OS-CAL) 600 MG TABS tablet Take 600 mg by mouth daily.  . Cholecalciferol (VITAMIN D3) 50000 UNITS CAPS  Take by mouth daily.  Marland Kitchen escitalopram (LEXAPRO) 10 MG tablet TAKE 1 TABLET BY MOUTH ONCE A DAY.  Marland Kitchen estrogens, conjugated, (PREMARIN) 0.45 MG tablet Take 0.45 mg by mouth daily. Take daily for 21 days then do not take for 7 days.  . fish oil-omega-3 fatty acids 1000 MG capsule Take 2 g by mouth daily.  . hydrochlorothiazide (HYDRODIURIL) 12.5 MG tablet Take 1 tablet (12.5 mg total) by mouth daily.  . Multiple Vitamin (MULTIVITAMIN) capsule Take 1 capsule by mouth daily.  Marland Kitchen omeprazole (PRILOSEC) 20 MG capsule Take 20 mg by mouth as needed.   . potassium chloride (K-DUR) 10 MEQ tablet Take 1 tablet (10 mEq total) by mouth daily.  . Probiotic Product (ALIGN) 4 MG CAPS Take by mouth daily.      Physical Exam Physical Exam Ht 5\' 5"  (1.651 m)   Wt 174 lb (78.9 kg)   BMI 28.96 kg/m   Gen. appearance Normal well-groomed The patient is alert and oriented person place and time Mood is normal affect is normal Ambulatory status slight limp right leg  Exam of the left knee full range of motion stability and strength skin is normal  Right knee tenderness over the medial compartment small effusion knee flexion is 125 extension is 10. Stable test for anterior cruciate ligament PCL collateral ligaments muscle tone and strength normal skin intact  good distal pulses dorsalis pedis and posterior tib right foot Positive McMurray sign for medial pain and meniscal tear  Data Reviewed PT notes indicate patient did go to therapy right knee films from the hospital  My interpretation is that the knee has no acute fracture there is an osteochondroma most likely.    Assessment    Encounter Diagnoses  Name Primary?  . Medial meniscus tear, right, initial encounter Yes  . Back pain with left-sided radiculopathy        Plan    MRI right knee for medial meniscus tear       Arther Abbott 03/06/2016, 4:40 PM

## 2016-03-06 NOTE — Patient Instructions (Signed)
MRI

## 2016-03-07 ENCOUNTER — Ambulatory Visit (HOSPITAL_COMMUNITY): Payer: PRIVATE HEALTH INSURANCE | Admitting: Physical Therapy

## 2016-03-09 ENCOUNTER — Encounter (HOSPITAL_COMMUNITY): Payer: PRIVATE HEALTH INSURANCE

## 2016-03-14 ENCOUNTER — Encounter (HOSPITAL_COMMUNITY): Payer: PRIVATE HEALTH INSURANCE | Admitting: Physical Therapy

## 2016-03-16 ENCOUNTER — Ambulatory Visit
Admission: RE | Admit: 2016-03-16 | Discharge: 2016-03-16 | Disposition: A | Discharge status: Home / Self Care | Payer: PRIVATE HEALTH INSURANCE | Source: Ambulatory Visit | Attending: Orthopedic Surgery | Admitting: Orthopedic Surgery

## 2016-03-16 ENCOUNTER — Encounter (HOSPITAL_COMMUNITY): Payer: PRIVATE HEALTH INSURANCE | Admitting: Physical Therapy

## 2016-03-16 DIAGNOSIS — S83241A Other tear of medial meniscus, current injury, right knee, initial encounter: Secondary | ICD-10-CM

## 2016-03-20 ENCOUNTER — Ambulatory Visit (INDEPENDENT_AMBULATORY_CARE_PROVIDER_SITE_OTHER): Payer: PRIVATE HEALTH INSURANCE | Admitting: Orthopedic Surgery

## 2016-03-20 ENCOUNTER — Encounter: Payer: Self-pay | Admitting: Orthopedic Surgery

## 2016-03-20 VITALS — BP 137/84 | HR 66 | Wt 177.0 lb

## 2016-03-20 DIAGNOSIS — M2241 Chondromalacia patellae, right knee: Secondary | ICD-10-CM | POA: Diagnosis not present

## 2016-03-20 NOTE — Patient Instructions (Signed)
Home exercises  

## 2016-03-20 NOTE — Progress Notes (Signed)
Patient ID: [name], female   DOB: [birth year], 55 y.o.   MRN: [id number]  Chief Complaint  Patient presents with  . Follow-up    mri review rt knee    HPI [name] is a 55 y.o. female.   HPI  55 year old female twisted right knee. Couldn't straighten her leg had difficulty walking pain and swelling medial compartment mechanical symptoms as well with catching and locking.  MRI showed chondromalacia patella  Review of Systems Review of Systems  System review is unchanged with no new findings  Physical Exam BP 137/84   Pulse 66   Wt 177 lb (80.3 kg)   BMI 29.45 kg/m    Physical Exam  CARTILAGE   Patellofemoral: Cartilage loss is seen along the lateral patellar facet with small subchondral cysts in the superior pole of the patella noted.   Medial:  Unremarkable.   Lateral:  Mildly degenerated.   Joint:  Small joint effusion.   Popliteal Fossa:  No Baker's cyst.   Extensor Mechanism: Intact. 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   Bones: No fracture or worrisome marrow lesion. There is no osteochondroma off the femoral trochlea as indicated on report of prior plain films. The finding likely was likely due to super imposition of structures on plain films.   Other: None.   IMPRESSION: Negative for meniscal or ligament tear.  No acute abnormality.   Chondromalacia lateral patella facet. Mild chondromalacia lateral compartment also noted.   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     Electronically Signed   By: Inge Rise M.D.   On: 03/17/2016 08:37  Personal interpretation of the x-ray shows no fracture no dislocation mild chondral changes in the patella mild increased signal changes in the quadriceps tendon. No meniscal tear.   Explain the findings to the patient went over the  exercises with her. She is mildly improved should improve with exercises over 6 weeks. No follow-up necessary   Diagnosis chondromalacia patella

## 2016-03-28 LAB — LIPID PANEL
Chol/HDL Ratio: 2.7 ratio units (ref 0.0–4.4)
Cholesterol, Total: 194 mg/dL (ref 100–199)
HDL: 72 mg/dL (ref >39)
LDL Calculated: 110 mg/dL — ABNORMAL HIGH (ref 0–99)
Triglycerides: 60 mg/dL (ref 0–149)
VLDL Cholesterol Cal: 12 mg/dL (ref 5–40)

## 2016-03-28 LAB — BASIC METABOLIC PANEL
BUN / CREAT RATIO: 16 (ref 9–23)
BUN: 14 mg/dL (ref 6–24)
CO2: 25 mmol/L (ref 18–29)
CREATININE: 0.88 mg/dL (ref 0.57–1.00)
Calcium: 9.6 mg/dL (ref 8.7–10.2)
Chloride: 104 mmol/L (ref 96–106)
GFR calc Af Amer: 86 mL/min/{1.73_m2} (ref >59)
GFR calc non Af Amer: 74 mL/min/{1.73_m2} (ref >59)
GLUCOSE: 96 mg/dL (ref 65–99)
Potassium: 4.4 mmol/L (ref 3.5–5.2)
Sodium: 143 mmol/L (ref 134–144)

## 2016-04-03 ENCOUNTER — Encounter: Payer: Self-pay | Admitting: Nurse Practitioner

## 2016-04-03 ENCOUNTER — Ambulatory Visit (INDEPENDENT_AMBULATORY_CARE_PROVIDER_SITE_OTHER): Payer: PRIVATE HEALTH INSURANCE | Admitting: Nurse Practitioner

## 2016-04-03 VITALS — BP 132/88 | Ht 64.75 in | Wt 175.0 lb

## 2016-04-03 DIAGNOSIS — Z0000 Encounter for general adult medical examination without abnormal findings: Secondary | ICD-10-CM | POA: Diagnosis not present

## 2016-04-03 DIAGNOSIS — M2669 Other specified disorders of temporomandibular joint: Secondary | ICD-10-CM

## 2016-04-03 DIAGNOSIS — J320 Chronic maxillary sinusitis: Secondary | ICD-10-CM | POA: Diagnosis not present

## 2016-04-03 DIAGNOSIS — Z1231 Encounter for screening mammogram for malignant neoplasm of breast: Secondary | ICD-10-CM | POA: Diagnosis not present

## 2016-04-03 MED ORDER — AMOXICILLIN-POT CLAVULANATE 875-125 MG PO TABS: 1.0000 | ORAL_TABLET | Freq: Two times a day (BID) | ORAL | 0 refills | Status: DC

## 2016-04-03 MED ORDER — ALPRAZOLAM 0.5 MG PO TABS: ORAL_TABLET | ORAL | 2 refills | Status: DC

## 2016-04-03 MED ORDER — PREDNISONE 20 MG PO TABS: ORAL_TABLET | ORAL | 0 refills | Status: DC

## 2016-04-03 NOTE — Progress Notes (Signed)
Subjective:    Patient ID: [name], female    DOB: 07-15-1960, 55 y.o.   MRN: [id number]  HPI presents for her wellness exam. Married, same sexual partner. Continues to see hormone specialist. Regular vision and dental exams. Having significant pain in the left maxillary area in to the left TMJ area. Difficulty opening her mouth fully due to pain. No dental problems that she is aware of. Began 2 days ago. Feels like her jaw is out of alignment. Started wearing her retainer again. No fever, minimal cough. Green mucus.     Review of Systems  Constitutional: Negative for activity change, appetite change, fatigue and fever.  HENT: Positive for postnasal drip and sinus pressure. Negative for dental problem, ear pain and sore throat.   Respiratory: Negative for cough, chest tightness, shortness of breath and wheezing.   Cardiovascular: Negative for chest pain.  Gastrointestinal: Negative for abdominal distention, abdominal pain, blood in stool, constipation, diarrhea, nausea and vomiting.  Genitourinary: Negative for difficulty urinating, dysuria, enuresis, frequency, genital sores, pelvic pain, urgency and vaginal discharge.       Objective:   Physical Exam  Constitutional: She is oriented to person, place, and time. She appears well-developed. No distress.  HENT:  Right Ear: External ear normal.  Left Ear: External ear normal.  Mouth/Throat: Oropharynx is clear and moist.  Mild edema under left eye; tenderness to palpation. Distinct tenderness at left TMJ. Difficulty opening mouth. TMs clear effusion. Pharynx injected with PND noted. Neck supple with mild anterior adenopathy.   Neck: Normal range of motion. Neck supple. No tracheal deviation present. No thyromegaly present.  Cardiovascular: Normal rate, regular rhythm and normal heart sounds.  Exam reveals no gallop.   No murmur heard. Pulmonary/Chest: Effort normal and breath sounds normal.  Abdominal: Soft. She exhibits no  distension. There is no tenderness.  Genitourinary: Vagina normal. No vaginal discharge found.  Genitourinary Comments: External GU: no rashes or lesions. Vagina: no discharge. Bimanual exam: no tenderness or masses. Rectal exam: no masses; no stool for hemoccult.   Musculoskeletal: She exhibits no edema.  Lymphadenopathy:    She has no cervical adenopathy.  Neurological: She is alert and oriented to person, place, and time.  Skin: Skin is warm and dry. No rash noted.  Psychiatric: She has a normal mood and affect. Her behavior is normal.  Vitals reviewed. Breast exam: areas of dense tissue; mild fine nodularity; no masses; axillae no adenopathy.  Reviewed labs with patient from 03/27/16       Assessment & Plan:  Routine general medical examination at a health care facility - Plan: POC Hemoccult Bld/Stl (3-Cd Home Screen)  Visit for screening mammogram - Plan: MM DIGITAL SCREENING BILATERAL  Left maxillary sinusitis  TMJ inflammation  Meds ordered this encounter  Medications  . vitamin B-12 (CYANOCOBALAMIN) 1000 MCG tablet    Sig: Take 1,000 mcg by mouth daily.  . progesterone (PROMETRIUM) 100 MG capsule    Sig: Take 100 mg by mouth daily.  Marland Kitchen PRESCRIPTION MEDICATION    Sig: EST/TEST 0.25mg /ml vaginal cream 12 gram. Take one ml vaginally three times a week.  . ALPRAZolam (XANAX) 0.5 MG tablet    Sig: 1/2 - 1 po BID prn anxiety    Dispense:  30 tablet    Refill:  2    Order Specific Question:   Supervising Provider    Answer:   Mikey Kirschner [[id number]]  . amoxicillin-clavulanate (AUGMENTIN) 875-125 MG tablet    Sig: Take  1 tablet by mouth 2 (two) times daily.    Dispense:  20 tablet    Refill:  0    Order Specific Question:   Supervising Provider    Answer:   Mikey Kirschner [[id number]]  . predniSONE (DELTASONE) 20 MG tablet    Sig: 3 po qd x 3 d then 2 po qd x 3 d then 1 po qd x 2 d    Dispense:  17 tablet    Refill:  0    Order Specific Question:   Supervising Provider     Answer:   Mikey Kirschner [[id number]]   Massage to TMJ with heat applications. Warning signs reviewed. Call back by end of week if no improvement, sooner if worse.  Return in about 6 months (around 10/02/2016) for recheck.

## 2016-04-16 ENCOUNTER — Encounter: Payer: Self-pay | Admitting: Family Medicine

## 2016-04-17 ENCOUNTER — Ambulatory Visit (HOSPITAL_COMMUNITY)
Admission: RE | Admit: 2016-04-17 | Discharge: 2016-04-17 | Disposition: A | Discharge status: Home / Self Care | Payer: PRIVATE HEALTH INSURANCE | Source: Ambulatory Visit | Attending: Nurse Practitioner | Admitting: Nurse Practitioner

## 2016-04-17 ENCOUNTER — Other Ambulatory Visit: Payer: Self-pay | Admitting: Nurse Practitioner

## 2016-04-17 ENCOUNTER — Encounter (HOSPITAL_COMMUNITY): Payer: Self-pay

## 2016-04-17 ENCOUNTER — Ambulatory Visit (HOSPITAL_COMMUNITY): Admission: RE | Admit: 2016-04-17 | Payer: PRIVATE HEALTH INSURANCE | Source: Ambulatory Visit

## 2016-04-17 DIAGNOSIS — Z1231 Encounter for screening mammogram for malignant neoplasm of breast: Secondary | ICD-10-CM | POA: Diagnosis not present

## 2016-04-17 MED ORDER — CEFPROZIL 500 MG PO TABS: 500.0000 mg | ORAL_TABLET | Freq: Two times a day (BID) | ORAL | 0 refills | Status: DC

## 2016-04-17 NOTE — Telephone Encounter (Signed)
It sounds like you are still dealing with a sinus infection I will call you in an additional round of antibiotics. Cefzil 500 mg 1 twice a day for the next 10 days area it is okay to use Mucinex or Robitussin-DM along with saline nasal sprays. I would not use decongestants because they can cause your blood pressure to go up. You should gradually get better over the next 10-14 days. If you're getting worse or if you're illness persists I would recommend that we see you back for recheck. Take care-Dr. Nicki Reaper the prescription was sent to [facility]

## 2016-05-16 ENCOUNTER — Encounter: Payer: Self-pay | Admitting: Nurse Practitioner

## 2016-05-17 ENCOUNTER — Telehealth: Payer: Self-pay | Admitting: Nurse Practitioner

## 2016-05-17 NOTE — Telephone Encounter (Signed)
Please print mammogram and physical report for patient and call for her to pick up. Her insurance requires that her name be on reports. Thanks.

## 2016-05-30 ENCOUNTER — Encounter: Payer: Self-pay | Admitting: Family Medicine

## 2016-05-30 ENCOUNTER — Ambulatory Visit (INDEPENDENT_AMBULATORY_CARE_PROVIDER_SITE_OTHER): Payer: PRIVATE HEALTH INSURANCE | Admitting: Family Medicine

## 2016-05-30 ENCOUNTER — Ambulatory Visit (HOSPITAL_COMMUNITY)
Admission: RE | Admit: 2016-05-30 | Discharge: 2016-05-30 | Disposition: A | Discharge status: Home / Self Care | Payer: PRIVATE HEALTH INSURANCE | Source: Ambulatory Visit | Attending: Family Medicine | Admitting: Family Medicine

## 2016-05-30 ENCOUNTER — Other Ambulatory Visit (HOSPITAL_COMMUNITY)
Admission: RE | Admit: 2016-05-30 | Discharge: 2016-05-30 | Disposition: A | Discharge status: Home / Self Care | Payer: PRIVATE HEALTH INSURANCE | Source: Ambulatory Visit | Attending: Family Medicine | Admitting: Family Medicine

## 2016-05-30 ENCOUNTER — Other Ambulatory Visit: Payer: Self-pay | Admitting: *Deleted

## 2016-05-30 VITALS — BP 126/80 | Temp 98.1°F | Ht 64.75 in | Wt 179.5 lb

## 2016-05-30 DIAGNOSIS — M79604 Pain in right leg: Secondary | ICD-10-CM

## 2016-05-30 LAB — D-DIMER, QUANTITATIVE: D-Dimer, Quant: 0.46 ug/mL-FEU (ref 0.00–0.50)

## 2016-05-30 MED ORDER — MELOXICAM 15 MG PO TABS: 15.0000 mg | ORAL_TABLET | Freq: Every day | ORAL | 0 refills | Status: DC

## 2016-05-30 NOTE — Progress Notes (Signed)
   Subjective:    Patient ID: [name], female    DOB: 06/04/1961, 55 y.o.   MRN: [id number]  Knee Pain   The incident occurred 2 days ago. There was no injury mechanism. The pain is present in the right knee. The quality of the pain is described as aching. The pain is moderate. The pain has been constant since onset. She reports no foreign bodies present. The symptoms are aggravated by movement. Treatments tried: lidocaine cream. The treatment provided no relief.   Patient has no other concerns at this time.    Review of Systems She complains of behind the knee pain on the right side some Pain some swelling in legs denies sharp pains in chest or shortness of breath.    Objective:   Physical Exam  Lungs clear hearts regular back and right knee tender minimal crepitus in the knee the lower calf is swollen compared to left calf Time was also spent reviewing the results of the testing communicating the results with the patient in real-time same day 25 minutes was spent evaluating this patient discussing multiple different options and setting up the tests as well    Assessment & Plan:  Pain in the knee in the calf area need to rule out DVT d-dimer ultrasound ordered stat may need to go with anti-inflammatories if these tests are negative may need orthopedic referral if symptoms persist

## 2016-06-21 ENCOUNTER — Ambulatory Visit (INDEPENDENT_AMBULATORY_CARE_PROVIDER_SITE_OTHER): Payer: PRIVATE HEALTH INSURANCE | Admitting: Family Medicine

## 2016-06-21 ENCOUNTER — Encounter: Payer: Self-pay | Admitting: Family Medicine

## 2016-06-21 VITALS — BP 124/94 | Temp 98.0°F | Ht 64.75 in | Wt 176.4 lb

## 2016-06-21 DIAGNOSIS — I10 Essential (primary) hypertension: Secondary | ICD-10-CM | POA: Diagnosis not present

## 2016-06-21 MED ORDER — POTASSIUM CHLORIDE ER 10 MEQ PO TBCR: 10.0000 meq | EXTENDED_RELEASE_TABLET | Freq: Every day | ORAL | 5 refills | Status: DC

## 2016-06-21 MED ORDER — HYDROCHLOROTHIAZIDE 12.5 MG PO TABS: 12.5000 mg | ORAL_TABLET | Freq: Every day | ORAL | 6 refills | Status: DC

## 2016-06-21 NOTE — Progress Notes (Signed)
   Subjective:    Patient ID: [name], female    DOB: [birth year], 56 y.o.   MRN: [id number]  Hypertension  This is a chronic problem. The current episode started more than 1 year ago. The problem is unchanged. Associated symptoms include headaches. Pertinent negatives include no chest pain or shortness of breath. (Lightheadedness) There are no associated agents to hypertension. There are no known risk factors for coronary artery disease. Treatments tried: blood pressure meds. There are no compliance problems.    Patient states that she has no other concerns at this time.    Review of Systems  Constitutional: Negative for activity change, fatigue and fever.  Respiratory: Negative for cough and shortness of breath.   Cardiovascular: Negative for chest pain and leg swelling.  Neurological: Positive for dizziness and headaches.       Objective:   Physical Exam  Constitutional: She appears well-nourished. No distress.  Cardiovascular: Normal rate, regular rhythm and normal heart sounds.   No murmur heard. Pulmonary/Chest: Effort normal and breath sounds normal. No respiratory distress.  Musculoskeletal: She exhibits no edema.  Lymphadenopathy:    She has no cervical adenopathy.  Neurological: She is alert. She exhibits normal muscle tone.  Psychiatric: Her behavior is normal.  Vitals reviewed.   Finger to nose normal Romberg negative patient can walk internal without difficulty no sign of stroke  Patient will check lab work in approximately 2 weeks    Assessment & Plan:  HTN subpar control she is using her medication every other day she states when she takes her potassium daily it causes her to go the bathroom too often. Although I am sympathetic with this she is tried other blood pressure medicines without success or had side effects to them as well. I informed her she needs to do you do her HCTZ 12.5 mg every single day along with potassium 10 mEq every single day I also  recommend highly that the patient follow-up in a few months time for recheck she will send [place] readings on how she is doing in approximately 1-2 weeks. I find no evidence of stroke

## 2016-07-04 ENCOUNTER — Other Ambulatory Visit: Payer: Self-pay | Admitting: *Deleted

## 2016-07-04 ENCOUNTER — Encounter: Payer: Self-pay | Admitting: Family Medicine

## 2016-07-04 MED ORDER — HYDROCHLOROTHIAZIDE 25 MG PO TABS: 25.0000 mg | ORAL_TABLET | Freq: Every day | ORAL | 1 refills | Status: DC

## 2016-07-04 NOTE — Progress Notes (Signed)
hctz

## 2016-07-04 NOTE — Telephone Encounter (Signed)
Nurse's-please increase HCTZ new dose 25 mg daily, check blood pressure intermittently send [place] new readings in several weeks follow-up in the spring

## 2016-07-18 ENCOUNTER — Encounter: Payer: Self-pay | Admitting: Family Medicine

## 2016-07-24 MED ORDER — NIFEDIPINE ER OSMOTIC RELEASE 30 MG PO TB24: ORAL_TABLET | ORAL | 1 refills | Status: DC

## 2016-07-24 NOTE — Telephone Encounter (Signed)
Please inform the patient that I would recommend adding a medication to what she is doing. Ideally she should try to get her blood pressure to where the top number is in the 120s-low 130s over 60s, 70s. Continue HCTZ. Add nifedipine XL 30 mg 1 daily the patient should do a follow-up visit in the spring she can send [place] some blood pressure readings in a couple weeks.

## 2016-07-24 NOTE — Addendum Note (Signed)
Addended by: Launa Grill on: 07/24/2016 08:25 AM   Modules accepted: Orders

## 2016-08-15 ENCOUNTER — Encounter: Payer: Self-pay | Admitting: Nurse Practitioner

## 2016-08-16 ENCOUNTER — Other Ambulatory Visit: Payer: Self-pay | Admitting: Nurse Practitioner

## 2016-08-16 MED ORDER — ESCITALOPRAM OXALATE 10 MG PO TABS: 10.0000 mg | ORAL_TABLET | Freq: Every day | ORAL | 5 refills | Status: DC

## 2016-10-02 ENCOUNTER — Ambulatory Visit (INDEPENDENT_AMBULATORY_CARE_PROVIDER_SITE_OTHER): Payer: PRIVATE HEALTH INSURANCE | Admitting: Nurse Practitioner

## 2016-10-02 ENCOUNTER — Encounter: Payer: Self-pay | Admitting: Nurse Practitioner

## 2016-10-02 VITALS — BP 132/80 | Ht 64.75 in | Wt 182.0 lb

## 2016-10-02 DIAGNOSIS — F329 Major depressive disorder, single episode, unspecified: Secondary | ICD-10-CM

## 2016-10-02 DIAGNOSIS — I10 Essential (primary) hypertension: Secondary | ICD-10-CM

## 2016-10-02 DIAGNOSIS — R7303 Prediabetes: Secondary | ICD-10-CM | POA: Diagnosis not present

## 2016-10-02 DIAGNOSIS — F419 Anxiety disorder, unspecified: Secondary | ICD-10-CM | POA: Diagnosis not present

## 2016-10-02 LAB — POCT GLYCOSYLATED HEMOGLOBIN (HGB A1C): Hemoglobin A1C: 5.6

## 2016-10-02 MED ORDER — ALPRAZOLAM 0.5 MG PO TABS: ORAL_TABLET | ORAL | 2 refills | Status: DC

## 2016-10-05 ENCOUNTER — Encounter: Payer: Self-pay | Admitting: Nurse Practitioner

## 2016-10-05 NOTE — Progress Notes (Signed)
Subjective:  Presents for routine follow-up of hypertension. No chest pain/ischemic type pain or shortness of breath. No edema. Continues to look at options regarding chronic hair loss. Is no longer going to [facility] for follow-up. Takes occasional Xanax for anxiety otherwise doing well.  Objective:   BP 132/80   Ht 5' 4.75" (1.645 m)   Wt 182 lb (82.6 kg)   BMI 30.52 kg/m  NAD. Alert, oriented. Calm affect. Lungs clear. Heart regular rate rhythm. Very faint areas of hyperpigmentation noted on the anterior neck area with no specific pattern. Results for orders placed or performed in visit on 10/02/16  POCT HgB A1C  Result Value Ref Range   Hemoglobin A1C 5.6      Assessment:   Problem List Items Addressed This Visit      Cardiovascular and Mediastinum   Essential hypertension, benign - Primary     Other   Anxiety and depression   Prediabetes   Relevant Orders   POCT HgB A1C (Completed)       Plan:   Meds ordered this encounter  Medications  . GINKGO BILOBA PO    Sig: Take by mouth.  . ALPRAZolam (XANAX) 0.5 MG tablet    Sig: 1/2 - 1 po BID prn anxiety    Dispense:  30 tablet    Refill:  2    Order Specific Question:   Supervising Provider    Answer:   Mikey Kirschner [[id number]]     Continue current medications as directed. Discussed importance of stress reduction. Encouraged healthy diet and regular activity. Return in about 6 months (around 04/03/2017). Routine labs at that time.

## 2016-10-20 ENCOUNTER — Ambulatory Visit (INDEPENDENT_AMBULATORY_CARE_PROVIDER_SITE_OTHER): Payer: PRIVATE HEALTH INSURANCE | Admitting: Family Medicine

## 2016-10-20 ENCOUNTER — Encounter: Payer: Self-pay | Admitting: Family Medicine

## 2016-10-20 VITALS — BP 122/82 | Temp 98.4°F | Ht 64.75 in | Wt 179.2 lb

## 2016-10-20 DIAGNOSIS — A084 Viral intestinal infection, unspecified: Secondary | ICD-10-CM | POA: Diagnosis not present

## 2016-10-20 MED ORDER — ONDANSETRON 4 MG PO TBDP
4.0000 mg | ORAL_TABLET | Freq: Three times a day (TID) | ORAL | 0 refills | Status: DC | PRN
Start: 2016-10-20 — End: 2017-04-02

## 2016-10-20 NOTE — Progress Notes (Signed)
   Subjective:    Patient ID: [name], female    DOB: [birth year], 56 y.o.   MRN: [id number]  Emesis   This is a new problem. The current episode started yesterday. Associated symptoms include abdominal pain.   Aching a t times  No gallbladder present  24 hours duration. Several episodes of vomiting. Nausea persists. No recent vomiting. Abdominal cramps off-and-on. Vomit was bilious at times. No fever no chills no new foods. No diarrhea. Feeling slightly better at this time   Review of Systems  Gastrointestinal: Positive for abdominal pain and vomiting.  See above     Objective:   Physical Exam  Alert vitals stable, NAD. Blood pressure good on repeat. HEENT normal. Lungs clear. Heart regular rate and rhythm. Hyperactive bowel sounds no discrete tenderness      Assessment & Plan:  Impression viral gastroenteritis plan Zofran when necessary. Warning signs and diet discussed at length some

## 2016-11-07 ENCOUNTER — Other Ambulatory Visit: Payer: Self-pay | Admitting: Family Medicine

## 2016-11-16 ENCOUNTER — Telehealth: Payer: Self-pay | Admitting: Family Medicine

## 2016-11-16 NOTE — Telephone Encounter (Signed)
Pt dropped off lab results. In red folder in office.

## 2016-11-17 ENCOUNTER — Encounter: Payer: Self-pay | Admitting: Family Medicine

## 2016-11-17 NOTE — Telephone Encounter (Signed)
Lab work was reviewed. LDL slightly up HDL very good A1c 5.8 creatinine 1.08 fasting glucose 107 other lab work looks good patient keep follow-up in the fall a letter was sent to the patient exercise healthy eating and try to lose weight

## 2017-04-02 ENCOUNTER — Ambulatory Visit (INDEPENDENT_AMBULATORY_CARE_PROVIDER_SITE_OTHER): Payer: PRIVATE HEALTH INSURANCE | Admitting: Nurse Practitioner

## 2017-04-02 ENCOUNTER — Encounter: Payer: Self-pay | Admitting: Nurse Practitioner

## 2017-04-02 VITALS — BP 138/88 | Ht 65.0 in | Wt 178.0 lb

## 2017-04-02 DIAGNOSIS — R5383 Other fatigue: Secondary | ICD-10-CM | POA: Diagnosis not present

## 2017-04-02 DIAGNOSIS — Z01419 Encounter for gynecological examination (general) (routine) without abnormal findings: Secondary | ICD-10-CM | POA: Diagnosis not present

## 2017-04-02 NOTE — Patient Instructions (Signed)
Propecia (Finasteride)

## 2017-04-03 ENCOUNTER — Encounter: Payer: Self-pay | Admitting: Nurse Practitioner

## 2017-04-03 LAB — VITAMIN D 25 HYDROXY (VIT D DEFICIENCY, FRACTURES): Vit D, 25-Hydroxy: 29.9 ng/mL — ABNORMAL LOW (ref 30.0–100.0)

## 2017-04-03 LAB — TSH: TSH: 0.583 u[IU]/mL (ref 0.450–4.500)

## 2017-04-03 NOTE — Progress Notes (Signed)
   Subjective:    Patient ID: [name], female    DOB: Jul 09, 1960, 56 y.o.   MRN: [id number]  HPI presents for her wellness exam. Has had a total hysterectomy. Same sexual partner. Still seeing specialist for hair loss. Regular vision and dental exams. Limited activity. Has not taken her BP med this am.     Review of Systems  Constitutional: Positive for fatigue. Negative for activity change and appetite change.  HENT: Negative for dental problem, ear pain, sinus pressure and sore throat.   Respiratory: Negative for cough, chest tightness, shortness of breath and wheezing.   Cardiovascular: Negative for chest pain.  Gastrointestinal: Negative for abdominal distention, abdominal pain, blood in stool, constipation, diarrhea, nausea and vomiting.  Genitourinary: Negative for difficulty urinating, dysuria, enuresis, frequency, genital sores, pelvic pain, urgency and vaginal discharge.       Objective:   Physical Exam  Constitutional: She is oriented to person, place, and time. She appears well-developed. No distress.  HENT:  Right Ear: External ear normal.  Left Ear: External ear normal.  Mouth/Throat: Oropharynx is clear and moist.  Neck: Normal range of motion. Neck supple. No tracheal deviation present. No thyromegaly present.  Cardiovascular: Normal rate, regular rhythm and normal heart sounds.  Exam reveals no gallop.   No murmur heard. Pulmonary/Chest: Effort normal and breath sounds normal. Right breast exhibits no inverted nipple, no mass and no skin change. Left breast exhibits no inverted nipple, no mass and no skin change. Breasts are symmetrical.  Abdominal: Soft. She exhibits no distension. There is no tenderness.  Genitourinary: Vagina normal. No vaginal discharge found.  Genitourinary Comments: External GU: no rashes or lesions. Vagina: no discharge. Bimanual exam: no tenderness or obvious masses.   Musculoskeletal: She exhibits no edema.  Lymphadenopathy:    She  has no cervical adenopathy.  Neurological: She is alert and oriented to person, place, and time.  Skin: Skin is warm and dry. No rash noted.  Psychiatric: She has a normal mood and affect. Her behavior is normal.  Vitals reviewed. Axillae no adenopathy.        Assessment & Plan:  Well woman exam - Plan: TSH, VITAMIN D 25 Hydroxy (Vit-D Deficiency, Fractures)  Fatigue, unspecified type - Plan: TSH, VITAMIN D 25 Hydroxy (Vit-D Deficiency, Fractures)  Encouraged daily calcium and vitamin D. Recommend regular activity such as a walking program and compliance with meds.  Return in about 6 months (around 10/01/2017) for routine follow up.

## 2017-04-05 ENCOUNTER — Other Ambulatory Visit: Payer: Self-pay | Admitting: Family Medicine

## 2017-04-05 DIAGNOSIS — Z1231 Encounter for screening mammogram for malignant neoplasm of breast: Secondary | ICD-10-CM

## 2017-04-10 ENCOUNTER — Encounter: Payer: Self-pay | Admitting: Family Medicine

## 2017-04-10 ENCOUNTER — Ambulatory Visit (INDEPENDENT_AMBULATORY_CARE_PROVIDER_SITE_OTHER): Payer: PRIVATE HEALTH INSURANCE | Admitting: Family Medicine

## 2017-04-10 VITALS — BP 130/84 | Temp 98.4°F | Ht 65.0 in | Wt 177.2 lb

## 2017-04-10 DIAGNOSIS — J019 Acute sinusitis, unspecified: Secondary | ICD-10-CM | POA: Diagnosis not present

## 2017-04-10 MED ORDER — AMOXICILLIN-POT CLAVULANATE 875-125 MG PO TABS: 1.0000 | ORAL_TABLET | Freq: Two times a day (BID) | ORAL | 0 refills | Status: DC

## 2017-04-10 MED ORDER — ALBUTEROL SULFATE HFA 108 (90 BASE) MCG/ACT IN AERS: 2.0000 | INHALATION_SPRAY | RESPIRATORY_TRACT | 2 refills | Status: DC | PRN

## 2017-04-10 NOTE — Progress Notes (Signed)
Subjective:     Patient ID: [name], female   DOB: [birth year], 56 y.o.   MRN: [id number]  HPI Patient with head congestion drainage coughing sinus pressure not feeling good symptoms over the past week she tried some leftover antibiotics it really doesn't help much she also took some over-the-counter cough medicine she relates a moderate amount of coughing some chest soreness and discomfort in the left anterior and left posterior chest denies any severe shortness of breath some intermittent wheezing. No vomiting or diarrhea. PMH HTN.  Review of Systems  Constitutional: Negative for activity change and fever.  HENT: Positive for congestion and rhinorrhea. Negative for ear pain.   Eyes: Negative for discharge.  Respiratory: Positive for cough and wheezing. Negative for shortness of breath.   Cardiovascular: Positive for chest pain. Negative for leg swelling.       Objective:   Physical Exam  Constitutional: She appears well-developed.  HENT:  Head: Normocephalic.  Right Ear: External ear normal.  Left Ear: External ear normal.  Nose: Nose normal.  Mouth/Throat: Oropharynx is clear and moist. No oropharyngeal exudate.  Eyes: Right eye exhibits no discharge. Left eye exhibits no discharge.  Neck: Neck supple. No tracheal deviation present.  Cardiovascular: Normal rate and normal heart sounds.   No murmur heard. Pulmonary/Chest: Effort normal and breath sounds normal. She has no wheezes. She has no rales.  Lymphadenopathy:    She has no cervical adenopathy.  Skin: Skin is warm and dry.  Nursing note and vitals reviewed.      Assessment:     Viral syndrome, acute rhinosinusitis, pleuritic pain, no evidence of pneumonia    Plan:     Augmentin twice a day 10 days, Delsym cough medicine, OTC measures for congestion either Mucinex or Robitussin If high fevers or progressively worse follow-up Albuterol when necessary for wheezing Saline nasal sprays when necessary I do not  feel the patient needs any type of x-rays lab work at this time patient does not appear to be toxic

## 2017-04-19 ENCOUNTER — Ambulatory Visit (HOSPITAL_COMMUNITY): Payer: PRIVATE HEALTH INSURANCE

## 2017-04-23 ENCOUNTER — Ambulatory Visit (HOSPITAL_COMMUNITY)
Admission: RE | Admit: 2017-04-23 | Discharge: 2017-04-23 | Disposition: A | Discharge status: Home / Self Care | Payer: PRIVATE HEALTH INSURANCE | Source: Ambulatory Visit | Attending: Family Medicine | Admitting: Family Medicine

## 2017-04-23 ENCOUNTER — Ambulatory Visit: Payer: PRIVATE HEALTH INSURANCE | Admitting: Family Medicine

## 2017-04-23 ENCOUNTER — Encounter: Payer: Self-pay | Admitting: Family Medicine

## 2017-04-23 ENCOUNTER — Telehealth: Payer: Self-pay | Admitting: Family Medicine

## 2017-04-23 ENCOUNTER — Other Ambulatory Visit (HOSPITAL_COMMUNITY)
Admission: RE | Admit: 2017-04-23 | Discharge: 2017-04-23 | Disposition: A | Discharge status: Home / Self Care | Payer: PRIVATE HEALTH INSURANCE | Source: Ambulatory Visit | Attending: *Deleted | Admitting: *Deleted

## 2017-04-23 VITALS — BP 118/76 | Temp 98.1°F | Ht 65.0 in | Wt 177.0 lb

## 2017-04-23 DIAGNOSIS — R0602 Shortness of breath: Secondary | ICD-10-CM | POA: Diagnosis not present

## 2017-04-23 DIAGNOSIS — R0781 Pleurodynia: Secondary | ICD-10-CM | POA: Insufficient documentation

## 2017-04-23 DIAGNOSIS — R0789 Other chest pain: Secondary | ICD-10-CM

## 2017-04-23 DIAGNOSIS — R05 Cough: Secondary | ICD-10-CM | POA: Insufficient documentation

## 2017-04-23 LAB — BASIC METABOLIC PANEL
Anion gap: 0 — ABNORMAL LOW (ref 5–15)
BUN: 14 mg/dL (ref 6–20)
CALCIUM: 9 mg/dL (ref 8.9–10.3)
CO2: 28 mmol/L (ref 22–32)
CREATININE: 1.04 mg/dL — AB (ref 0.44–1.00)
Chloride: 111 mmol/L (ref 101–111)
GFR, EST NON AFRICAN AMERICAN: 59 mL/min — AB (ref >60)
Glucose, Bld: 108 mg/dL — ABNORMAL HIGH (ref 65–99)
Potassium: 3.8 mmol/L (ref 3.5–5.1)
SODIUM: 139 mmol/L (ref 135–145)

## 2017-04-23 LAB — CBC WITH DIFFERENTIAL/PLATELET
BASOS PCT: 0 %
Basophils Absolute: 0 10*3/uL (ref 0.0–0.1)
EOS ABS: 0.1 10*3/uL (ref 0.0–0.7)
Eosinophils Relative: 1 %
HCT: 38.5 % (ref 36.0–46.0)
HEMOGLOBIN: 12.8 g/dL (ref 12.0–15.0)
Lymphocytes Relative: 32 %
Lymphs Abs: 2.3 10*3/uL (ref 0.7–4.0)
MCH: 29 pg (ref 26.0–34.0)
MCHC: 33.2 g/dL (ref 30.0–36.0)
MCV: 87.1 fL (ref 78.0–100.0)
MONO ABS: 0.6 10*3/uL (ref 0.1–1.0)
MONOS PCT: 8 %
NEUTROS PCT: 59 %
Neutro Abs: 4.4 10*3/uL (ref 1.7–7.7)
Platelets: 255 10*3/uL (ref 150–400)
RBC: 4.42 MIL/uL (ref 3.87–5.11)
RDW: 13.8 % (ref 11.5–15.5)
WBC: 7.3 10*3/uL (ref 4.0–10.5)

## 2017-04-23 LAB — D-DIMER, QUANTITATIVE (NOT AT ARMC): D DIMER QUANT: 0.43 ug{FEU}/mL (ref 0.00–0.50)

## 2017-04-23 LAB — SEDIMENTATION RATE: SED RATE: 20 mm/h (ref 0–22)

## 2017-04-23 MED ORDER — PREDNISONE 20 MG PO TABS: ORAL_TABLET | ORAL | 0 refills | Status: DC

## 2017-04-23 MED ORDER — AZITHROMYCIN 250 MG PO TABS: ORAL_TABLET | ORAL | 0 refills | Status: DC

## 2017-04-23 NOTE — Telephone Encounter (Signed)
Pt is aware and she will come in today.

## 2017-04-23 NOTE — Progress Notes (Signed)
   Subjective:    Patient ID: [name], female    DOB: 1960/10/23, 56 y.o.   MRN: [id number]  Cough  This is a new problem. Episode onset: 10/23. Associated symptoms include chest pain and shortness of breath. Pertinent negatives include no ear pain, fever, rhinorrhea or wheezing. Associated symptoms comments: Back pain. Treatments tried: augmentin. The treatment provided moderate relief.  Patient relates antibiotic really did not improve anything she still has a chest discomfort on the left side she describes as a soreness when she takes a deep breath and when she blows out she denies feeling short of breath on a regular basis but it comes and goes and at times feels a little heavy but she denies any high fever chills or sweats she does relate low energy.  PMH benign    Review of Systems  Constitutional: Negative for activity change and fever.  HENT: Negative for congestion, ear pain and rhinorrhea.   Eyes: Negative for discharge.  Respiratory: Positive for cough and shortness of breath. Negative for wheezing.   Cardiovascular: Positive for chest pain.  Gastrointestinal: Negative for abdominal pain, nausea and vomiting.       Objective:   Physical Exam  Constitutional: She appears well-developed.  HENT:  Head: Normocephalic.  Right Ear: External ear normal.  Left Ear: External ear normal.  Nose: Nose normal.  Mouth/Throat: Oropharynx is clear and moist. No oropharyngeal exudate.  Eyes: Right eye exhibits no discharge. Left eye exhibits no discharge.  Neck: Neck supple. No tracheal deviation present.  Cardiovascular: Normal rate and normal heart sounds.  No murmur heard. Pulmonary/Chest: Effort normal and breath sounds normal. She has no wheezes. She has no rales.  Lymphadenopathy:    She has no cervical adenopathy.  Skin: Skin is warm and dry.  Nursing note and vitals reviewed.   No swelling in the legs respiratory rate is normal no sign of any type of pneumonia     Assessment & Plan:  Chest wall tenderness and pain Need to do x-ray lab work to rule out more problematic underlying issues May need a short course of prednisone New antibiotic was sent in Await the findings  Lab work looks good d-dimer was negative x-ray did not show pneumonia she needs to finish out the antibiotic will also be a prednisone taper she is to let us know if not doing better within 1 week's time

## 2017-04-23 NOTE — Telephone Encounter (Signed)
Pt states she finished her Amoxicillin [id number] one BID and she is still having pain or discomfort when she breaths in. She says she did not pick up the inhaler. She would like to know if she needs another antibiotic, Does she need to come back in? Please advise.Thanks,CS

## 2017-04-23 NOTE — Telephone Encounter (Signed)
Because the patient is having pain and discomfort when she takes of breath then it would be wise for her to be rechecked to make sure there is not pneumonia I could see her at the end of today

## 2017-04-23 NOTE — Telephone Encounter (Signed)
Pt called stating that she has finished her antibiotics and that she is not any better. Pt was told to call back if symptoms did not improve.

## 2017-04-30 ENCOUNTER — Other Ambulatory Visit: Payer: Self-pay | Admitting: Nurse Practitioner

## 2017-05-07 ENCOUNTER — Ambulatory Visit (HOSPITAL_COMMUNITY)
Admission: RE | Admit: 2017-05-07 | Discharge: 2017-05-07 | Disposition: A | Discharge status: Home / Self Care | Payer: PRIVATE HEALTH INSURANCE | Source: Ambulatory Visit | Attending: Family Medicine | Admitting: Family Medicine

## 2017-05-07 DIAGNOSIS — Z1231 Encounter for screening mammogram for malignant neoplasm of breast: Secondary | ICD-10-CM | POA: Insufficient documentation

## 2017-06-01 ENCOUNTER — Other Ambulatory Visit: Payer: Self-pay | Admitting: Nurse Practitioner

## 2017-08-03 ENCOUNTER — Other Ambulatory Visit: Payer: Self-pay | Admitting: Family Medicine

## 2017-08-03 MED ORDER — NIFEDIPINE ER OSMOTIC RELEASE 30 MG PO TB24: ORAL_TABLET | ORAL | 0 refills | Status: DC

## 2017-08-03 NOTE — Addendum Note (Signed)
Addended by: Dairl Ponder on: 08/03/2017 03:31 PM   Modules accepted: Orders

## 2017-10-03 ENCOUNTER — Ambulatory Visit: Payer: PRIVATE HEALTH INSURANCE | Admitting: Nurse Practitioner

## 2017-10-09 DIAGNOSIS — N951 Menopausal and female climacteric states: Secondary | ICD-10-CM | POA: Insufficient documentation

## 2017-10-09 LAB — HEPATIC FUNCTION PANEL
ALT: 16 (ref 7–35)
AST: 15 (ref 13–35)
Alkaline Phosphatase: 96 (ref 25–125)
Bilirubin, Total: 0.4

## 2017-10-09 LAB — BASIC METABOLIC PANEL
BUN: 15 (ref 4–21)
CREATININE: 0.9 (ref 0.5–1.1)
GLUCOSE: 89
POTASSIUM: 4.6 (ref 3.4–5.3)
Sodium: 145 (ref 137–147)

## 2017-10-09 LAB — HEMOGLOBIN A1C: HEMOGLOBIN A1C: 5.9 % — AB (ref 4.0–5.6)

## 2017-10-18 ENCOUNTER — Other Ambulatory Visit (HOSPITAL_COMMUNITY): Payer: Self-pay | Admitting: Internal Medicine

## 2017-10-18 DIAGNOSIS — N644 Mastodynia: Secondary | ICD-10-CM

## 2017-10-22 ENCOUNTER — Encounter: Payer: Self-pay | Admitting: Nurse Practitioner

## 2017-10-22 ENCOUNTER — Ambulatory Visit: Payer: PRIVATE HEALTH INSURANCE | Admitting: Nurse Practitioner

## 2017-10-22 VITALS — BP 146/88 | Ht 65.0 in | Wt 178.0 lb

## 2017-10-22 DIAGNOSIS — I10 Essential (primary) hypertension: Secondary | ICD-10-CM

## 2017-10-22 DIAGNOSIS — F329 Major depressive disorder, single episode, unspecified: Secondary | ICD-10-CM | POA: Diagnosis not present

## 2017-10-22 DIAGNOSIS — F419 Anxiety disorder, unspecified: Secondary | ICD-10-CM

## 2017-10-23 ENCOUNTER — Encounter: Payer: Self-pay | Admitting: Nurse Practitioner

## 2017-10-23 NOTE — Progress Notes (Signed)
Subjective: Presents for routine follow-up on her blood pressure and anxiety/depression.  Doing well on her current dose of Lexapro. Takes a rare Xanax for her anxiety.  Takes nifedipine daily.  Does not take HCTZ on a regular basis, only takes potassium on the days that she takes her fluid pill. Denies CP/ischemic type pain or SOB. No visual changes. No difficulty speaking or swallowing. No numbness or weakness of the face, arms or legs.  Rare edema. Depression screen [facility] 2/9 10/22/2017 04/02/2017  Decreased Interest 0 0  Down, Depressed, Hopeless 0 0  PHQ - 2 Score 0 0  Altered sleeping 1 -  Tired, decreased energy 1 -  Change in appetite 0 -  Feeling bad or failure about yourself  0 -  Trouble concentrating 2 -  Moving slowly or fidgety/restless 0 -  Suicidal thoughts 0 -  PHQ-9 Score 4 -  Difficult doing work/chores Not difficult at all -   GAD 7 : Generalized Anxiety Score 10/22/2017  Nervous, Anxious, on Edge 0  Control/stop worrying 0  Worry too much - different things 0  Trouble relaxing 1  Restless 0  Easily annoyed or irritable 0  Afraid - awful might happen 0  Total GAD 7 Score 1  Anxiety Difficulty Not difficult at all       Objective:   BP (!) 146/88   Ht 5\' 5"  (1.651 m)   Wt 178 lb 0.4 oz (80.8 kg)   BMI 29.62 kg/m  NAD.  Alert, oriented.  Lungs clear.  Heart regular rate and rhythm.  Carotids no bruits or thrills.  Lower extremities no edema.  Weight stable.  Assessment:   Problem List Items Addressed This Visit      Cardiovascular and Mediastinum   Essential hypertension, benign - Primary     Other   Anxiety and depression       Plan:    Continue current medications as directed.  Encouraged regular activity and weight loss of 10- 15 pounds.  Recommend daily HCTZ with potassium supplement.  Reminded about preventive health physical and routine labs this fall.   Return in about 6 months (around 04/24/2018) for BP recheck.

## 2017-10-30 ENCOUNTER — Ambulatory Visit (HOSPITAL_COMMUNITY)
Admission: RE | Admit: 2017-10-30 | Discharge: 2017-10-30 | Disposition: A | Discharge status: Home / Self Care | Payer: PRIVATE HEALTH INSURANCE | Source: Ambulatory Visit | Attending: Internal Medicine | Admitting: Internal Medicine

## 2017-10-30 DIAGNOSIS — N644 Mastodynia: Secondary | ICD-10-CM

## 2017-11-09 ENCOUNTER — Other Ambulatory Visit: Payer: Self-pay | Admitting: Family Medicine

## 2017-12-08 IMAGING — MR MR KNEE*R* W/O CM
5 series · 34 of 40 positions shown · non-contrast
Comparison: Plain films of the right knee 02/05/2016

CLINICAL DATA: History of twisting injury right knee in the middle
[DATE] of this year with continued pain. Question medial meniscal
tear.

EXAM:
MRI OF THE RIGHT KNEE WITHOUT CONTRAST
TECHNIQUE: Multiplanar, multisequence MR imaging of the knee was performed. No
intravenous contrast was administered.

[Series 6: PD fat-sat · axial · right · 3.0mm · 0.39mm/px · z∈[-29,+96]mm · 11 of 36 slices shown (1 of 3)]
[im 1/36]
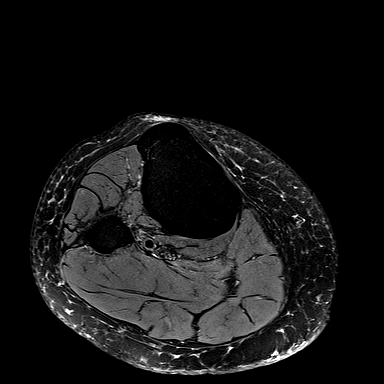
[im 4/36]
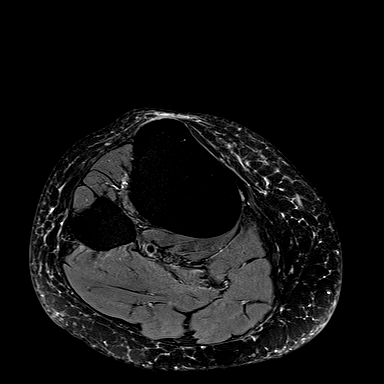
[im 8/36]
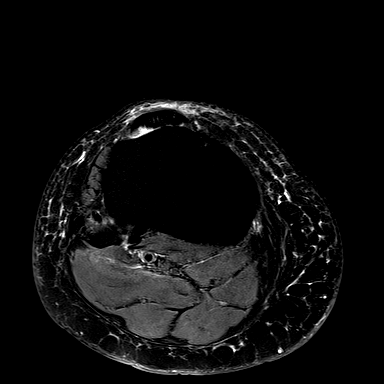
[im 11/36]
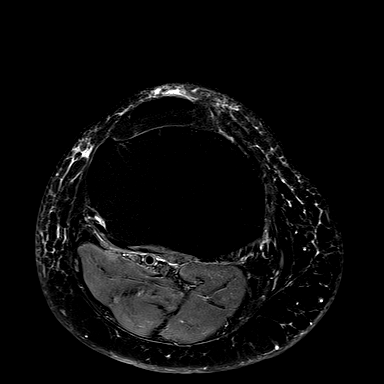
[im 15/36]
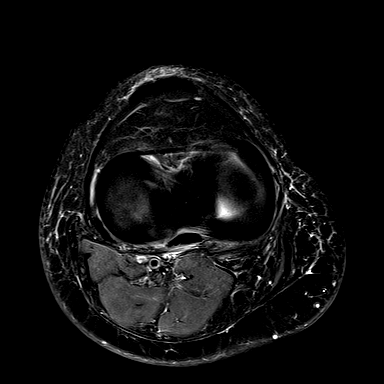
[im 18/36]
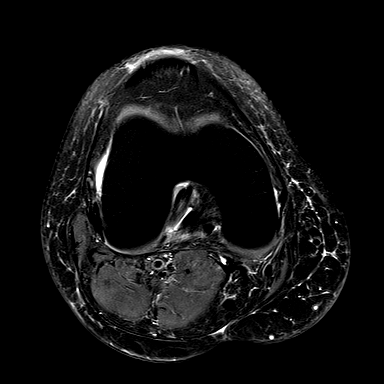
[im 22/36]
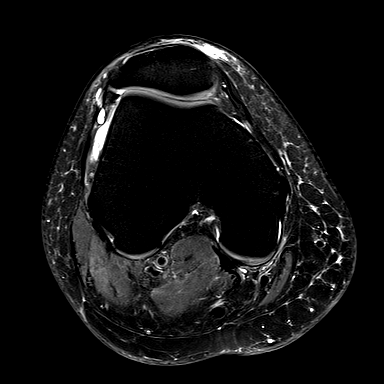
[im 25/36]
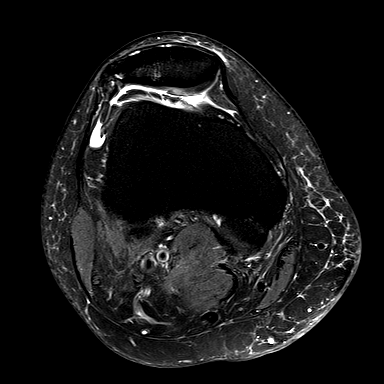
[im 29/36]
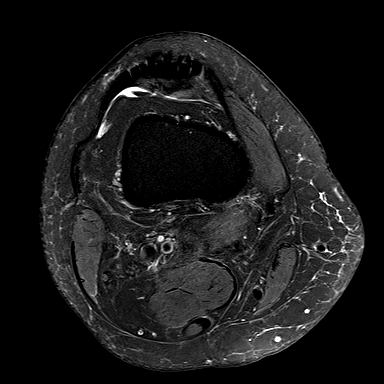
[im 32/36]
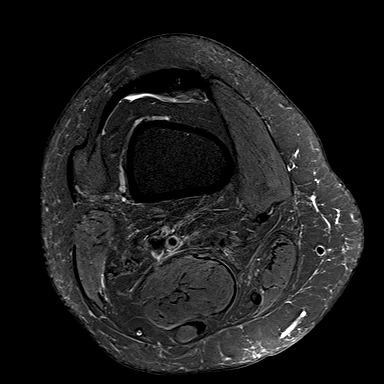
[im 36/36]
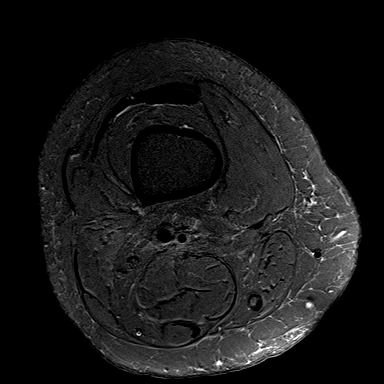

[Series 8: t1_tse_cor · coronal · right · 3.5mm · 0.39mm/px · 1 of 23 slices shown]
[im 1/23]
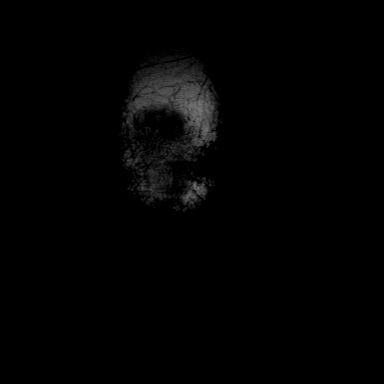

[Series 9: PD fat-sat · coronal · right · 3.5mm · 0.39mm/px · 7 of 23 slices shown (2 of 3)]
[im 1/23]
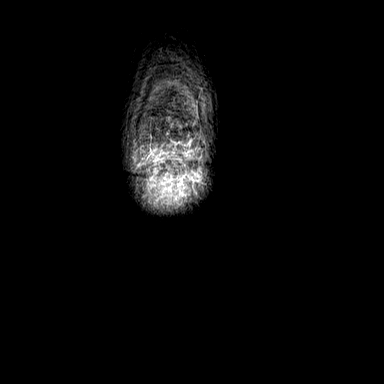
[im 4/23]
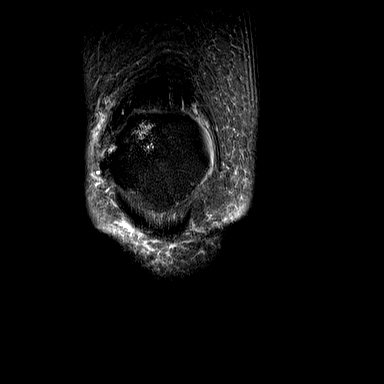
[im 8/23]
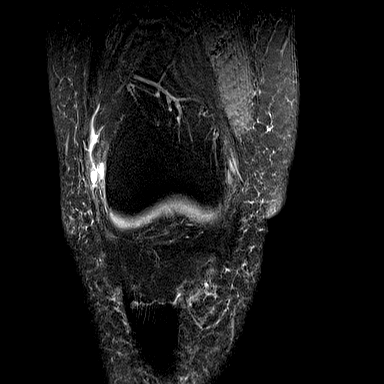
[im 12/23]
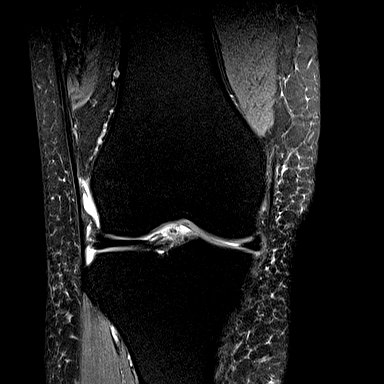
[im 15/23]
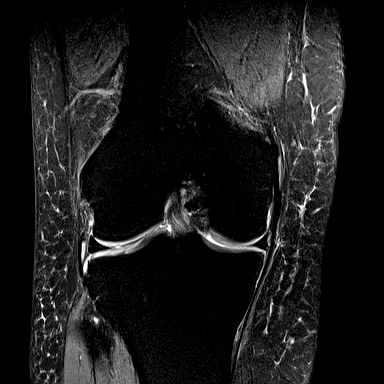
[im 19/23]
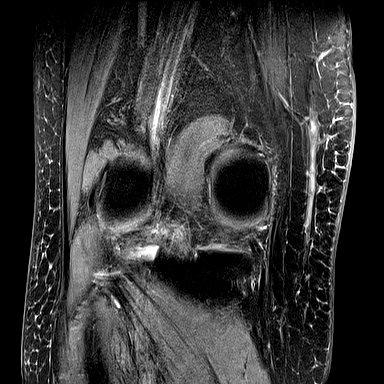
[im 23/23]
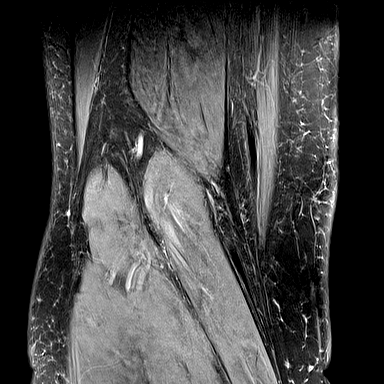

[Series 10: PD fat-sat · sagittal · right · 3.0mm · 0.39mm/px · 8 of 26 slices shown (3 of 3)]
[im 1/26]
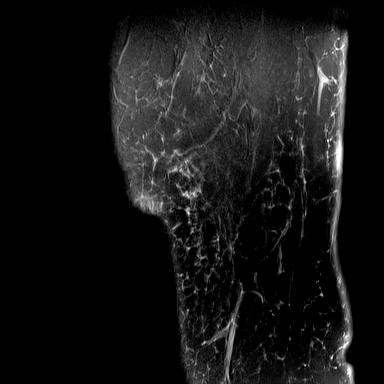
[im 4/26]
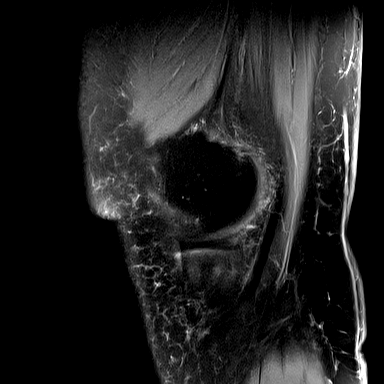
[im 8/26]
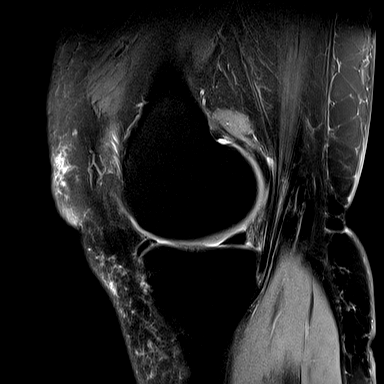
[im 11/26]
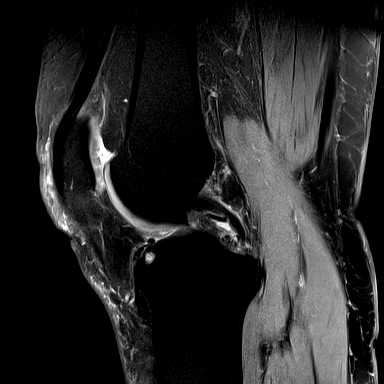
[im 15/26]
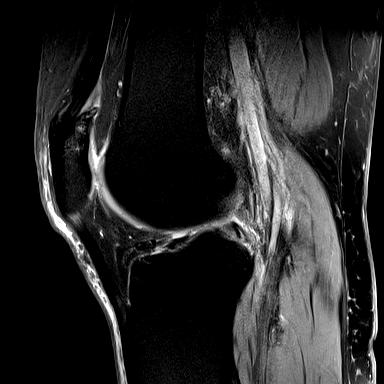
[im 18/26]
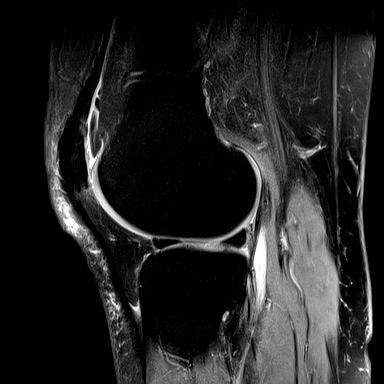
[im 22/26]
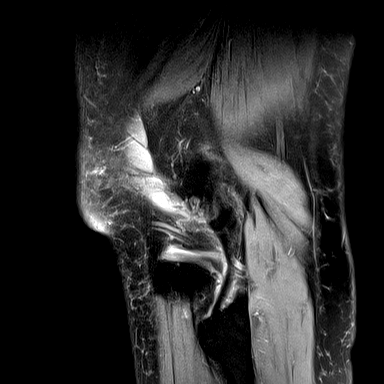
[im 26/26]
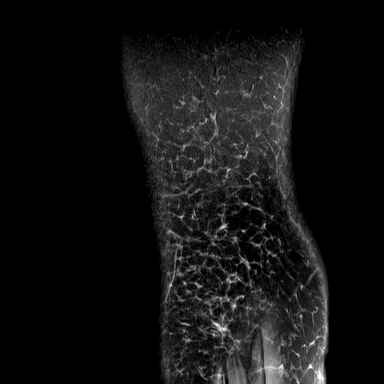

[Series 11: T2 fat-sat · coronal · right · 3.5mm · 0.39mm/px · 7 of 23 slices shown]
[im 1/23]
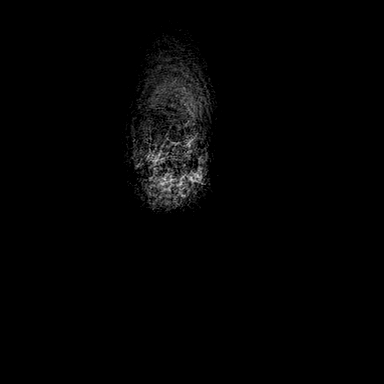
[im 4/23]
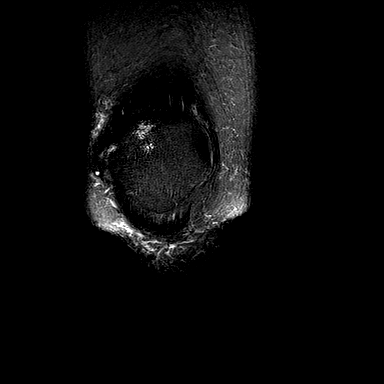
[im 8/23]
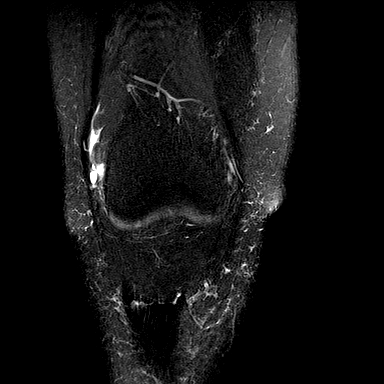
[im 12/23]
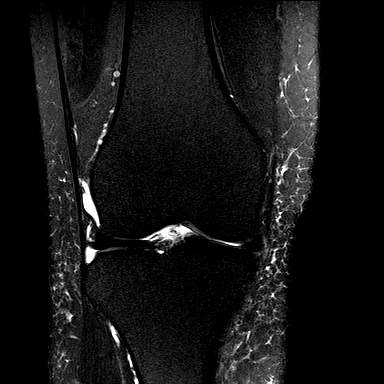
[im 15/23]
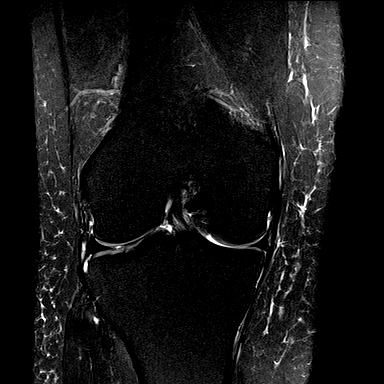
[im 19/23]
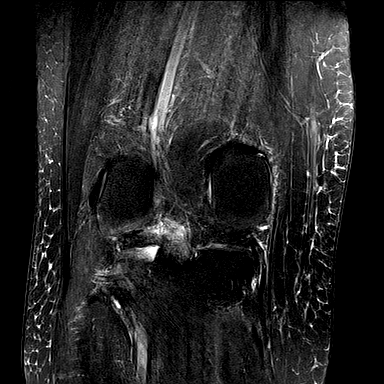
[im 23/23]
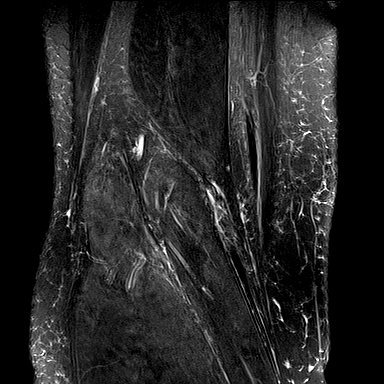

[34 of 40 positions shown; findings below may reference images not displayed]

FINDINGS: MENISCI

Medial meniscus:  Intact.

Lateral meniscus:  Intact.

LIGAMENTS

Cruciates:  Intact.

Collaterals:  Intact.

CARTILAGE

Patellofemoral: Cartilage loss is seen along the lateral patellar
facet with small subchondral cysts in the superior pole of the
patella noted.

Medial:  Unremarkable.

Lateral:  Mildly degenerated.

Joint:  Small joint effusion.

Popliteal Fossa:  No Baker's cyst.

Extensor Mechanism: Intact. Calcification or ossification off the
superior pole of the lateral patellar facet is compatible with
bipartite patella.

Bones: No fracture or worrisome marrow lesion. There is no
osteochondroma off the femoral trochlea as indicated on report of
prior plain films. The finding likely was likely due to super
imposition of structures on plain films.

Other: None.
IMPRESSION: Negative for meniscal or ligament tear.  No acute abnormality.

Chondromalacia lateral patella facet. Mild chondromalacia lateral
compartment also noted.

Bipartite patella versus remote injury lateral retinaculum at the
patellar attachment. Bipartite patella is favored.

## 2017-12-18 ENCOUNTER — Other Ambulatory Visit: Payer: Self-pay | Admitting: Nurse Practitioner

## 2017-12-18 ENCOUNTER — Other Ambulatory Visit: Payer: Self-pay | Admitting: Family Medicine

## 2017-12-18 NOTE — Telephone Encounter (Signed)
Refill each prescription plus four additional refills

## 2018-01-21 ENCOUNTER — Other Ambulatory Visit: Payer: Self-pay | Admitting: Family Medicine

## 2018-02-27 ENCOUNTER — Encounter: Payer: Self-pay | Admitting: Family Medicine

## 2018-02-27 ENCOUNTER — Ambulatory Visit: Payer: PRIVATE HEALTH INSURANCE | Admitting: Family Medicine

## 2018-02-27 ENCOUNTER — Ambulatory Visit (HOSPITAL_COMMUNITY)
Admission: RE | Admit: 2018-02-27 | Discharge: 2018-02-27 | Disposition: A | Discharge status: Home / Self Care | Payer: PRIVATE HEALTH INSURANCE | Source: Ambulatory Visit | Attending: Family Medicine | Admitting: Family Medicine

## 2018-02-27 VITALS — BP 134/88 | Ht 65.0 in

## 2018-02-27 DIAGNOSIS — M5432 Sciatica, left side: Secondary | ICD-10-CM

## 2018-02-27 DIAGNOSIS — M5137 Other intervertebral disc degeneration, lumbosacral region: Secondary | ICD-10-CM | POA: Insufficient documentation

## 2018-02-27 DIAGNOSIS — M21372 Foot drop, left foot: Secondary | ICD-10-CM | POA: Diagnosis not present

## 2018-02-27 DIAGNOSIS — M25562 Pain in left knee: Secondary | ICD-10-CM

## 2018-02-27 MED ORDER — PREDNISONE 20 MG PO TABS: ORAL_TABLET | ORAL | 0 refills | Status: DC

## 2018-02-27 NOTE — Progress Notes (Signed)
   Subjective:    Patient ID: [name], female    DOB: 02-22-1961, 57 y.o.   MRN: [id number]  Leg Pain   Incident onset: yesterday. There was no injury mechanism. The pain is present in the left leg. The pain is severe. Associated symptoms include an inability to bear weight. Treatments tried: knee brace and advil.  Patient relates recent onset of this severe pain in the back severe pain on the lateral aspect of her knee also severe pain down her leg hurts to move hurts to walk.  Denies any injury.  Tried a knee brace and anti-inflammatory without success.  Relates has a lot of discomfort.  No loss of bowel or bladder control.  Never had this before.  PMH benign    Review of Systems  Constitutional: Negative for activity change, appetite change and fatigue.  HENT: Negative for congestion and rhinorrhea.   Respiratory: Negative for cough and shortness of breath.   Cardiovascular: Negative for chest pain and leg swelling.  Gastrointestinal: Negative for abdominal pain and diarrhea.  Endocrine: Negative for polydipsia and polyphagia.  Skin: Negative for color change.  Neurological: Negative for dizziness and weakness.  Psychiatric/Behavioral: Negative for behavioral problems and confusion.       Objective:   Physical Exam  Constitutional: She appears well-nourished. No distress.  HENT:  Head: Normocephalic and atraumatic.  Eyes: Right eye exhibits no discharge. Left eye exhibits no discharge.  Neck: No tracheal deviation present.  Cardiovascular: Normal rate, regular rhythm and normal heart sounds.  No murmur heard. Pulmonary/Chest: Effort normal and breath sounds normal. No respiratory distress.  Musculoskeletal: She exhibits no edema.  Lymphadenopathy:    She has no cervical adenopathy.  Neurological: She is alert. Coordination normal.  Skin: Skin is warm and dry.  Psychiatric: She has a normal mood and affect. Her behavior is normal.  Vitals reviewed.   Positive  straight leg raise on the left side.  Reflexes diminished.  Left knee pain is referred pain    Assessment & Plan:  Severe sciatica Left foot weakness X-rays today Probably will need MRI Left foot drop Prednisone taper Call if ongoing troubles or worse Patient with significant trouble.  We will do x-rays.  Patient more more than likely need MRI.

## 2018-02-28 ENCOUNTER — Other Ambulatory Visit: Payer: Self-pay | Admitting: *Deleted

## 2018-02-28 ENCOUNTER — Ambulatory Visit (HOSPITAL_COMMUNITY)
Admission: RE | Admit: 2018-02-28 | Discharge: 2018-02-28 | Disposition: A | Discharge status: Home / Self Care | Payer: PRIVATE HEALTH INSURANCE | Source: Ambulatory Visit | Attending: Family Medicine | Admitting: Family Medicine

## 2018-02-28 DIAGNOSIS — M6281 Muscle weakness (generalized): Secondary | ICD-10-CM | POA: Diagnosis present

## 2018-02-28 DIAGNOSIS — M47816 Spondylosis without myelopathy or radiculopathy, lumbar region: Secondary | ICD-10-CM | POA: Diagnosis not present

## 2018-03-03 ENCOUNTER — Encounter: Payer: Self-pay | Admitting: Family Medicine

## 2018-03-04 ENCOUNTER — Other Ambulatory Visit: Payer: Self-pay | Admitting: *Deleted

## 2018-03-04 DIAGNOSIS — M5432 Sciatica, left side: Secondary | ICD-10-CM

## 2018-03-04 DIAGNOSIS — M21372 Foot drop, left foot: Secondary | ICD-10-CM

## 2018-03-08 ENCOUNTER — Encounter: Payer: Self-pay | Admitting: Family Medicine

## 2018-03-21 ENCOUNTER — Other Ambulatory Visit: Payer: Self-pay | Admitting: Neurological Surgery

## 2018-03-21 DIAGNOSIS — M5412 Radiculopathy, cervical region: Secondary | ICD-10-CM

## 2018-03-31 ENCOUNTER — Encounter: Payer: Self-pay | Admitting: Internal Medicine

## 2018-03-31 DIAGNOSIS — N951 Menopausal and female climacteric states: Secondary | ICD-10-CM

## 2018-04-03 ENCOUNTER — Encounter: Payer: PRIVATE HEALTH INSURANCE | Admitting: Family Medicine

## 2018-04-08 ENCOUNTER — Ambulatory Visit: Payer: PRIVATE HEALTH INSURANCE | Admitting: Internal Medicine

## 2018-04-08 ENCOUNTER — Encounter: Payer: Self-pay | Admitting: Internal Medicine

## 2018-04-08 VITALS — BP 134/92 | HR 60 | Temp 98.0°F | Ht 65.0 in | Wt 177.2 lb

## 2018-04-08 DIAGNOSIS — R030 Elevated blood-pressure reading, without diagnosis of hypertension: Secondary | ICD-10-CM | POA: Diagnosis not present

## 2018-04-08 DIAGNOSIS — Z6829 Body mass index (BMI) 29.0-29.9, adult: Secondary | ICD-10-CM

## 2018-04-08 DIAGNOSIS — Z79899 Other long term (current) drug therapy: Secondary | ICD-10-CM | POA: Diagnosis not present

## 2018-04-08 DIAGNOSIS — N951 Menopausal and female climacteric states: Secondary | ICD-10-CM

## 2018-04-08 NOTE — Patient Instructions (Signed)
DASH Eating Plan DASH stands for "Dietary Approaches to Stop Hypertension." The DASH eating plan is a healthy eating plan that has been shown to reduce high blood pressure (hypertension). It may also reduce your risk for type 2 diabetes, heart disease, and stroke. The DASH eating plan may also help with weight loss. What are tips for following this plan? General guidelines  Avoid eating more than 2,300 mg (milligrams) of salt (sodium) a day. If you have hypertension, you may need to reduce your sodium intake to 1,500 mg a day.  Limit alcohol intake to no more than 1 drink a day for nonpregnant women and 2 drinks a day for men. One drink equals 12 oz of beer, 5 oz of wine, or 1 oz of hard liquor.  Work with your health care provider to maintain a healthy body weight or to lose weight. Ask what an ideal weight is for you.  Get at least 30 minutes of exercise that causes your heart to beat faster (aerobic exercise) most days of the week. Activities may include walking, swimming, or biking.  Work with your health care provider or diet and nutrition specialist (dietitian) to adjust your eating plan to your individual calorie needs. Reading food labels  Check food labels for the amount of sodium per serving. Choose foods with less than 5 percent of the Daily Value of sodium. Generally, foods with less than 300 mg of sodium per serving fit into this eating plan.  To find whole grains, look for the word "whole" as the first word in the ingredient list. Shopping  Buy products labeled as "low-sodium" or "no salt added."  Buy fresh foods. Avoid canned foods and premade or frozen meals. Cooking  Avoid adding salt when cooking. Use salt-free seasonings or herbs instead of table salt or sea salt. Check with your health care provider or pharmacist before using salt substitutes.  Do not fry foods. Cook foods using healthy methods such as baking, boiling, grilling, and broiling instead.  Cook with  heart-healthy oils, such as olive, canola, soybean, or sunflower oil. Meal planning   Eat a balanced diet that includes: ? 5 or more servings of fruits and vegetables each day. At each meal, try to fill half of your plate with fruits and vegetables. ? Up to 6-8 servings of whole grains each day. ? Less than 6 oz of lean meat, poultry, or fish each day. A 3-oz serving of meat is about the same size as a deck of cards. One egg equals 1 oz. ? 2 servings of low-fat dairy each day. ? A serving of nuts, seeds, or beans 5 times each week. ? Heart-healthy fats. Healthy fats called Omega-3 fatty acids are found in foods such as flaxseeds and coldwater fish, like sardines, salmon, and mackerel.  Limit how much you eat of the following: ? Canned or prepackaged foods. ? Food that is high in trans fat, such as fried foods. ? Food that is high in saturated fat, such as fatty meat. ? Sweets, desserts, sugary drinks, and other foods with added sugar. ? Full-fat dairy products.  Do not salt foods before eating.  Try to eat at least 2 vegetarian meals each week.  Eat more home-cooked food and less restaurant, buffet, and fast food.  When eating at a restaurant, ask that your food be prepared with less salt or no salt, if possible. What foods are recommended? The items listed may not be a complete list. Talk with your dietitian about what   dietary choices are best for you. Grains Whole-grain or whole-wheat bread. Whole-grain or whole-wheat pasta. Brown rice. Oatmeal. Quinoa. Bulgur. Whole-grain and low-sodium cereals. Pita bread. Low-fat, low-sodium crackers. Whole-wheat flour tortillas. Vegetables Fresh or frozen vegetables (raw, steamed, roasted, or grilled). Low-sodium or reduced-sodium tomato and vegetable juice. Low-sodium or reduced-sodium tomato sauce and tomato paste. Low-sodium or reduced-sodium canned vegetables. Fruits All fresh, dried, or frozen fruit. Canned fruit in natural juice (without  added sugar). Meat and other protein foods Skinless chicken or turkey. Ground chicken or turkey. Pork with fat trimmed off. Fish and seafood. Egg whites. Dried beans, peas, or lentils. Unsalted nuts, nut butters, and seeds. Unsalted canned beans. Lean cuts of beef with fat trimmed off. Low-sodium, lean deli meat. Dairy Low-fat (1%) or fat-free (skim) milk. Fat-free, low-fat, or reduced-fat cheeses. Nonfat, low-sodium ricotta or cottage cheese. Low-fat or nonfat yogurt. Low-fat, low-sodium cheese. Fats and oils Soft margarine without trans fats. Vegetable oil. Low-fat, reduced-fat, or light mayonnaise and salad dressings (reduced-sodium). Canola, safflower, olive, soybean, and sunflower oils. Avocado. Seasoning and other foods Herbs. Spices. Seasoning mixes without salt. Unsalted popcorn and pretzels. Fat-free sweets. What foods are not recommended? The items listed may not be a complete list. Talk with your dietitian about what dietary choices are best for you. Grains Baked goods made with fat, such as croissants, muffins, or some breads. Dry pasta or rice meal packs. Vegetables Creamed or fried vegetables. Vegetables in a cheese sauce. Regular canned vegetables (not low-sodium or reduced-sodium). Regular canned tomato sauce and paste (not low-sodium or reduced-sodium). Regular tomato and vegetable juice (not low-sodium or reduced-sodium). Pickles. Olives. Fruits Canned fruit in a light or heavy syrup. Fried fruit. Fruit in cream or butter sauce. Meat and other protein foods Fatty cuts of meat. Ribs. Fried meat. Bacon. Sausage. Bologna and other processed lunch meats. Salami. Fatback. Hotdogs. Bratwurst. Salted nuts and seeds. Canned beans with added salt. Canned or smoked fish. Whole eggs or egg yolks. Chicken or turkey with skin. Dairy Whole or 2% milk, cream, and half-and-half. Whole or full-fat cream cheese. Whole-fat or sweetened yogurt. Full-fat cheese. Nondairy creamers. Whipped toppings.  Processed cheese and cheese spreads. Fats and oils Butter. Stick margarine. Lard. Shortening. Ghee. Bacon fat. Tropical oils, such as coconut, palm kernel, or palm oil. Seasoning and other foods Salted popcorn and pretzels. Onion salt, garlic salt, seasoned salt, table salt, and sea salt. Worcestershire sauce. Tartar sauce. Barbecue sauce. Teriyaki sauce. Soy sauce, including reduced-sodium. Steak sauce. Canned and packaged gravies. Fish sauce. Oyster sauce. Cocktail sauce. Horseradish that you find on the shelf. Ketchup. Mustard. Meat flavorings and tenderizers. Bouillon cubes. Hot sauce and Tabasco sauce. Premade or packaged marinades. Premade or packaged taco seasonings. Relishes. Regular salad dressings. Where to find more information:  National Heart, Lung, and Blood Institute: www.nhlbi.nih.gov  American Heart Association: www.heart.org Summary  The DASH eating plan is a healthy eating plan that has been shown to reduce high blood pressure (hypertension). It may also reduce your risk for type 2 diabetes, heart disease, and stroke.  With the DASH eating plan, you should limit salt (sodium) intake to 2,300 mg a day. If you have hypertension, you may need to reduce your sodium intake to 1,500 mg a day.  When on the DASH eating plan, aim to eat more fresh fruits and vegetables, whole grains, lean proteins, low-fat dairy, and heart-healthy fats.  Work with your health care provider or diet and nutrition specialist (dietitian) to adjust your eating plan to your individual   calorie needs. This information is not intended to replace advice given to you by your health care provider. Make sure you discuss any questions you have with your health care provider. Document Released: 05/25/2011 Document Revised: 05/29/2016 Document Reviewed: 05/29/2016 Elsevier Interactive Patient Education  2018 Elsevier Inc.  

## 2018-04-08 NOTE — Progress Notes (Signed)
Subjective:     Patient ID: [name] , female    DOB: Apr 08, 1961 , 57 y.o.   MRN: [id number]   SHE IS HERE TODAY FOR F/U BHRT. SHE REPORTS COMPLIANCE WITH PROGESTERONE SR CAPS AND VAGINAL ESTRIOL CREAM. UNFORTUNATELY, SHE HAS BEEN HESITANT TO STOP PREMARIN. SHE IS AWARE OF INCREASED RISK OF CLOTTING, ETC. HOWEVER, SHE IS RELUCTANT TO STOP BECAUSE SHE WANTS TO AVOID HAVING ANY HOT FLASHES.     Past Medical History:  Diagnosis Date  . Current use of estrogen therapy 02/24/2015  . Depressed 04/21/2015  . Gastroesophageal reflux disease   . Hair loss 02/24/2015  . Hot flashes 02/24/2015  . Hypertension       Current Outpatient Medications:  .  ALPRAZolam (XANAX) 0.5 MG tablet, TAKE (1/2) TO (1) TABLET TWICE DAILY AS NEEDED., Disp: 30 tablet, Rfl: 4 .  Ascorbic Acid (VITAMIN C PO), Take by mouth., Disp: , Rfl:  .  BIOTIN PO, Take by mouth daily., Disp: , Rfl:  .  calcium carbonate (OS-CAL) 600 MG TABS tablet, Take 600 mg by mouth daily., Disp: , Rfl:  .  escitalopram (LEXAPRO) 10 MG tablet, TAKE 1 TABLET BY MOUTH ONCE A DAY., Disp: 30 tablet, Rfl: 4 .  estrogens, conjugated, (PREMARIN) 0.45 MG tablet, Take 0.45 mg by mouth daily. Take daily for 21 days then do not take for 7 days., Disp: , Rfl:  .  fish oil-omega-3 fatty acids 1000 MG capsule, Take 2 g by mouth daily., Disp: , Rfl:  .  GINKGO BILOBA PO, Take by mouth., Disp: , Rfl:  .  hydrochlorothiazide (HYDRODIURIL) 25 MG tablet, TAKE (1) TABLET BY MOUTH ONCE DAILY., Disp: 30 tablet, Rfl: 0 .  Methylsulfonylmethane (MSM) 1000 MG CAPS, Take by mouth., Disp: , Rfl:  .  Multiple Vitamin (MULTIVITAMIN) capsule, Take 1 capsule by mouth daily., Disp: , Rfl:  .  NIFEdipine (PROCARDIA-XL/ADALAT-CC/NIFEDICAL-XL) 30 MG 24 hr tablet, TAKE (1) TABLET BY MOUTH ONCE DAILY., Disp: 30 tablet, Rfl: 5 .  omeprazole (PRILOSEC) 20 MG capsule, Take 20 mg by mouth as needed. , Disp: , Rfl:  .  potassium chloride (K-DUR) 10 MEQ tablet, Take 1 tablet (10 mEq  total) by mouth daily., Disp: 30 tablet, Rfl: 5 .  predniSONE (DELTASONE) 20 MG tablet, 3qd for 3d then 2qd for 3d then 1qd for 3d, Disp: 18 tablet, Rfl: 0 .  PRESCRIPTION MEDICATION, EST/TEST 0.25mg /ml vaginal cream 12 gram. Take one ml vaginally three times a week., Disp: , Rfl:  .  Probiotic Product (ALIGN) 4 MG CAPS, Take by mouth daily., Disp: , Rfl:  .  progesterone (PROMETRIUM) 100 MG capsule, Take 100 mg by mouth daily., Disp: , Rfl:  .  vitamin B-12 (CYANOCOBALAMIN) 1000 MCG tablet, Take 1,000 mcg by mouth daily., Disp: , Rfl:    Allergies  Allergen Reactions  . Hydrocodone Itching  . Lisinopril     Headache  Dizzy   . Metronidazole Hives  . Norvasc [Amlodipine Besylate]     Patient did not have true allergy she had side effects with medication     Review of Systems  Constitutional: Negative.   HENT: Negative.   Eyes: Negative.   Respiratory: Negative.   Cardiovascular: Negative.   Gastrointestinal: Negative.   Endocrine: Positive for heat intolerance.  Psychiatric/Behavioral: Negative.      Today's Vitals   04/08/18 1127  BP: (!) 134/92  Pulse: 60  Temp: 98 F (36.7 C)  TempSrc: Oral  Weight: 177 lb 3.2  oz (80.4 kg)  Height: 5\' 5"  (1.651 m)   Body mass index is 29.49 kg/m.   Objective:  Physical Exam  Constitutional: She is oriented to person, place, and time. She appears well-developed and well-nourished.  HENT:  Head: Normocephalic and atraumatic.  Neck: Normal range of motion.  Cardiovascular: Normal rate, regular rhythm and normal heart sounds.  Pulmonary/Chest: Effort normal and breath sounds normal.  Neurological: She is alert and oriented to person, place, and time.  Psychiatric: She has a normal mood and affect.  Nursing note and vitals reviewed.       Assessment And Plan:     1. Female climacteric state  I WILL DECREASE HER PREMARIN TO MWF DOSING. I WILL TRY TO WEAN HER OFF OF THIS COMPLETELY BY THE END OF THE YEAR. I WILL ALSO REFILL  PROGESTERONE SR 150MG . RX WILL BE CALLED INTO [facility]. SHE WILL RTO IN FOUR MONTHS FOR RE-EVALUATION.   2. Elevated blood pressure reading  SHE WAS GIVEN INFORMATION ON DASH DIET. SHE IS ENCOURAGED TO AVOID ADDING SALT TO HER FOODS. SHE WILL F/U WITH HER PCP NEXT WEEK. SHE WAS ALSO GIVEN INFORMATION ON THE DASH EATING PLAN.   3. BMI 29.0-29.9,adult    SHE IS ENCOURAGED TO STRIVE FOR BMI LESS THAN 25 TO DECREASE CARDIAC RISK. SHE IS ENCOURAGED TO EXERCISE NO LESS THAN FIVE DAYS WEEKLY FOR AT LEAST 30 MINUTES.    [name], MD

## 2018-04-09 ENCOUNTER — Telehealth: Payer: Self-pay | Admitting: Internal Medicine

## 2018-04-09 LAB — CMP14+EGFR
ALK PHOS: 102 IU/L (ref 39–117)
ALT: 14 IU/L (ref 0–32)
AST: 14 IU/L (ref 0–40)
Albumin/Globulin Ratio: 1.9 (ref 1.2–2.2)
Albumin: 4.4 g/dL (ref 3.5–5.5)
BILIRUBIN TOTAL: 0.5 mg/dL (ref 0.0–1.2)
BUN / CREAT RATIO: 14 (ref 9–23)
BUN: 14 mg/dL (ref 6–24)
CHLORIDE: 103 mmol/L (ref 96–106)
CO2: 25 mmol/L (ref 20–29)
Calcium: 9.4 mg/dL (ref 8.7–10.2)
Creatinine, Ser: 1 mg/dL (ref 0.57–1.00)
GFR calc Af Amer: 72 mL/min/{1.73_m2} (ref >59)
GFR calc non Af Amer: 63 mL/min/{1.73_m2} (ref >59)
GLOBULIN, TOTAL: 2.3 g/dL (ref 1.5–4.5)
GLUCOSE: 92 mg/dL (ref 65–99)
Potassium: 4.2 mmol/L (ref 3.5–5.2)
SODIUM: 141 mmol/L (ref 134–144)
Total Protein: 6.7 g/dL (ref 6.0–8.5)

## 2018-04-09 LAB — CBC
Hematocrit: 41 % (ref 34.0–46.6)
Hemoglobin: 13.2 g/dL (ref 11.1–15.9)
MCH: 28 pg (ref 26.6–33.0)
MCHC: 32.2 g/dL (ref 31.5–35.7)
MCV: 87 fL (ref 79–97)
PLATELETS: 267 10*3/uL (ref 150–450)
RBC: 4.72 x10E6/uL (ref 3.77–5.28)
RDW: 13.9 % (ref 12.3–15.4)
WBC: 6.9 10*3/uL (ref 3.4–10.8)

## 2018-04-09 LAB — TESTOSTERONE: Testosterone: 4 ng/dL (ref 3–41)

## 2018-04-15 NOTE — Telephone Encounter (Signed)
No note to sign.

## 2018-04-22 ENCOUNTER — Encounter: Payer: PRIVATE HEALTH INSURANCE | Admitting: Family Medicine

## 2018-04-22 ENCOUNTER — Ambulatory Visit: Payer: PRIVATE HEALTH INSURANCE | Admitting: Nurse Practitioner

## 2018-05-03 ENCOUNTER — Ambulatory Visit (INDEPENDENT_AMBULATORY_CARE_PROVIDER_SITE_OTHER): Payer: PRIVATE HEALTH INSURANCE | Admitting: Family Medicine

## 2018-05-03 ENCOUNTER — Encounter: Payer: Self-pay | Admitting: Family Medicine

## 2018-05-03 VITALS — BP 134/88 | Ht 65.0 in | Wt 179.0 lb

## 2018-05-03 DIAGNOSIS — Z0000 Encounter for general adult medical examination without abnormal findings: Secondary | ICD-10-CM | POA: Diagnosis not present

## 2018-05-03 DIAGNOSIS — Z23 Encounter for immunization: Secondary | ICD-10-CM | POA: Diagnosis not present

## 2018-05-03 DIAGNOSIS — F419 Anxiety disorder, unspecified: Secondary | ICD-10-CM | POA: Diagnosis not present

## 2018-05-03 DIAGNOSIS — Z13220 Encounter for screening for lipoid disorders: Secondary | ICD-10-CM

## 2018-05-03 DIAGNOSIS — Z1231 Encounter for screening mammogram for malignant neoplasm of breast: Secondary | ICD-10-CM | POA: Diagnosis not present

## 2018-05-03 DIAGNOSIS — F329 Major depressive disorder, single episode, unspecified: Secondary | ICD-10-CM

## 2018-05-03 DIAGNOSIS — F32A Depression, unspecified: Secondary | ICD-10-CM

## 2018-05-03 DIAGNOSIS — I10 Essential (primary) hypertension: Secondary | ICD-10-CM

## 2018-05-03 MED ORDER — NIFEDIPINE ER OSMOTIC RELEASE 30 MG PO TB24: 30.0000 mg | ORAL_TABLET | Freq: Every day | ORAL | 5 refills | Status: DC

## 2018-05-03 MED ORDER — ESCITALOPRAM OXALATE 10 MG PO TABS: 10.0000 mg | ORAL_TABLET | Freq: Every day | ORAL | 5 refills | Status: DC

## 2018-05-03 NOTE — Progress Notes (Signed)
Subjective:     Patient ID: [name], female   DOB: 1961-02-15, 57 y.o.   MRN: [id number]  HPI The patient comes in today for a wellness visit.  A review of their health history was completed. A review of medications was also completed.  Any needed refills; none  Eating habits: health conscious, does not do very well in eating healthy  Falls/  MVA accidents in past few months: none  Regular exercise: none,   Specialist pt sees on regular basis: Dr. Benson Setting ( hormone speciality), Dr Kimberlee Nearing - dermatologist; Regular eye and dental exams.   Preventative health issues were discussed.   Additional concerns: none   Hysterectomy, no issues or concerns. Sexually active 1 partner. Declines STD screening.   Depression/Anxiety: Doing well on lexapro and rare use of xanax. Compliant with medications. No adverse effects.   HTN: Compliant with medications. Taking nifedipine daily. Takes HCTZ and K+ only as needed. Denies any adverse effects.   Review of Systems  Constitutional: Negative for chills, fatigue, fever and unexpected weight change.  HENT: Negative for congestion, ear pain, sinus pressure, sinus pain and sore throat.   Eyes: Negative for discharge and visual disturbance.  Respiratory: Negative for cough, shortness of breath and wheezing.   Cardiovascular: Negative for chest pain and leg swelling.  Gastrointestinal: Negative for abdominal pain, blood in stool, constipation, diarrhea, nausea and vomiting.  Genitourinary: Negative for difficulty urinating, hematuria, pelvic pain, vaginal bleeding and vaginal discharge.  Neurological: Negative for dizziness, weakness, light-headedness and headaches.  Psychiatric/Behavioral: Negative for dysphoric mood and suicidal ideas.  All other systems reviewed and are negative.      Objective:   Physical Exam  Constitutional: She is oriented to person, place, and time. She appears well-developed and well-nourished. No  distress.  HENT:  Head: Normocephalic and atraumatic.  Right Ear: Tympanic membrane normal.  Left Ear: Tympanic membrane normal.  Nose: Nose normal.  Mouth/Throat: Uvula is midline and oropharynx is clear and moist.  Eyes: Pupils are equal, round, and reactive to light. Conjunctivae and EOM are normal. Right eye exhibits no discharge. Left eye exhibits no discharge.  Neck: Neck supple. No thyromegaly present.  Cardiovascular: Normal rate, regular rhythm and normal heart sounds.  No murmur heard. Pulmonary/Chest: Effort normal and breath sounds normal. No respiratory distress. She has no wheezes. She exhibits tenderness. Right breast exhibits no inverted nipple, no mass, no nipple discharge and no skin change. Left breast exhibits no inverted nipple, no mass, no nipple discharge and no skin change.  Fibrous breast tissue and tenderness noted bilaterally  Abdominal: Soft. Bowel sounds are normal. She exhibits no distension and no mass. There is no tenderness.  Genitourinary: Vagina normal. There is no rash, tenderness or lesion on the right labia. There is no rash, tenderness or lesion on the left labia.  Genitourinary Comments: Chaperone present. No tenderness or masses noted on bimanual exam.   Musculoskeletal: She exhibits no edema or deformity.  Lymphadenopathy:    She has no cervical adenopathy.  Neurological: She is alert and oriented to person, place, and time. Coordination normal.  Skin: Skin is warm and dry.  Psychiatric: She has a normal mood and affect.  Nursing note and vitals reviewed.      Assessment:     1. Routine general medical examination at a health care facility - Plan: Lipid panel  Screening for lipid disorders - Plan: Lipid panel  Need for vaccination - Plan: Tdap vaccine greater than or  equal to 7yo IM  Encounter for screening mammogram for breast cancer - Plan: MM DIGITAL SCREENING BILATERAL  2. Essential hypertension, benign  3. Anxiety and  depression      Plan:     1. Adult wellness-complete.wellness physical was conducted today. Importance of diet and exercise were discussed in detail.  In addition to this a discussion regarding safety was also covered. We also reviewed over immunizations and gave recommendations regarding current immunization needed for age.   -tdap today In addition to this additional areas were also touched on including: Preventative health exams needed:  Colonoscopy due 2023 Mammogram: scheduled today Pap Smear: N/A hysterectomy  Patient was advised yearly wellness exam. Screening labs ordered, will notify pt of results.   2. HTN: doing well on current medication, well controlled, encouraged healthy diet and exercise. Meds refilled. Labs reviewed. F/u in 6 months.  3. Depression and anxiety: doing well on current medication, would like to continue, meds refilled. F/u in 6 months.  Dr. Mickie Hillier was consulted on this case and is in agreement with the above treatment plan.

## 2018-05-03 NOTE — Patient Instructions (Addendum)
[place]  [phone]

## 2018-05-13 ENCOUNTER — Ambulatory Visit (HOSPITAL_COMMUNITY)
Admission: RE | Admit: 2018-05-13 | Discharge: 2018-05-13 | Disposition: A | Discharge status: Home / Self Care | Payer: PRIVATE HEALTH INSURANCE | Source: Ambulatory Visit | Attending: Family Medicine | Admitting: Family Medicine

## 2018-05-13 DIAGNOSIS — Z1231 Encounter for screening mammogram for malignant neoplasm of breast: Secondary | ICD-10-CM | POA: Insufficient documentation

## 2018-05-20 ENCOUNTER — Encounter: Payer: Self-pay | Admitting: Internal Medicine

## 2018-05-21 ENCOUNTER — Encounter: Payer: Self-pay | Admitting: Internal Medicine

## 2018-05-24 ENCOUNTER — Ambulatory Visit
Admission: RE | Admit: 2018-05-24 | Discharge: 2018-05-24 | Disposition: A | Discharge status: Home / Self Care | Payer: PRIVATE HEALTH INSURANCE | Source: Ambulatory Visit | Attending: Neurological Surgery | Admitting: Neurological Surgery

## 2018-05-24 DIAGNOSIS — M5412 Radiculopathy, cervical region: Secondary | ICD-10-CM

## 2018-05-28 ENCOUNTER — Encounter: Payer: Self-pay | Admitting: Family Medicine

## 2018-05-28 ENCOUNTER — Telehealth: Payer: Self-pay | Admitting: Family Medicine

## 2018-05-28 NOTE — Telephone Encounter (Signed)
Patient saw neurosurgeon down in [place] apparently they did evaluation and MRI Please see if you can connect with them to see if they will send [place] a copy of the office visit what recommendations they have

## 2018-05-31 ENCOUNTER — Other Ambulatory Visit: Payer: Self-pay | Admitting: Family Medicine

## 2018-05-31 NOTE — Telephone Encounter (Signed)
Made contact with patient to get copy of recent visit with neurosurgeon and copy of MRI

## 2018-06-04 LAB — LIPID PANEL
Chol/HDL Ratio: 3.1 ratio (ref 0.0–4.4)
Cholesterol, Total: 225 mg/dL — ABNORMAL HIGH (ref 100–199)
HDL: 73 mg/dL (ref >39)
LDL Calculated: 138 mg/dL — ABNORMAL HIGH (ref 0–99)
Triglycerides: 69 mg/dL (ref 0–149)
VLDL Cholesterol Cal: 14 mg/dL (ref 5–40)

## 2018-06-05 ENCOUNTER — Encounter: Payer: Self-pay | Admitting: Family Medicine

## 2018-06-06 NOTE — Telephone Encounter (Signed)
So this patient saw neurosurgeon She states that they sent the information to [place] Please see if medical records-Erica can locate this- I will need a printed copy of the neurosurgeons notes and MRI Please  forwarded to my inbox  If we have not received this then certainly someone will need to call down there so we will receive it thank you

## 2018-07-09 NOTE — Telephone Encounter (Signed)
Records have been received and sent to scan center to add to chart

## 2018-07-16 ENCOUNTER — Encounter: Payer: Self-pay | Admitting: Family Medicine

## 2018-07-16 ENCOUNTER — Ambulatory Visit: Payer: PRIVATE HEALTH INSURANCE | Admitting: Family Medicine

## 2018-07-16 VITALS — BP 120/78 | Temp 98.1°F | Ht 65.0 in | Wt 179.8 lb

## 2018-07-16 DIAGNOSIS — J019 Acute sinusitis, unspecified: Secondary | ICD-10-CM | POA: Diagnosis not present

## 2018-07-16 MED ORDER — CEFDINIR 300 MG PO CAPS: 300.0000 mg | ORAL_CAPSULE | Freq: Two times a day (BID) | ORAL | 0 refills | Status: DC

## 2018-07-16 NOTE — Progress Notes (Signed)
   Subjective:    Patient ID: [name], female    DOB: [birth year], 58 y.o.   MRN: [id number]  Cough  This is a new problem. The current episode started in the past 7 days. Associated symptoms include ear pain and nasal congestion. She has tried OTC cough suppressant for the symptoms.    Coughing and sneezing  Doing cough syrup and vicks vapor ru  No fever  Feeling chilling  Cough is non productive   Using onions and wter and lemon juice  bilat ear some  Pos gunky discharge   Patient also notes frontal headache congestion.  Review of Systems  HENT: Positive for ear pain.   Respiratory: Positive for cough.        Objective:   Physical Exam   Alert, mild malaise. Hydration good Vitals stable. frontal/ maxillary tenderness evident positive nasal congestion. pharynx normal neck supple  lungs clear/no crackles or wheezes. heart regular in rhythm      Assessment & Plan:  Impression rhinosinusitis likely post viral, discussed with patient. plan antibiotics prescribed. Questions answered. Symptomatic care discussed. warning signs discussed. WSL

## 2018-08-10 NOTE — Patient Instructions (Signed)

## 2018-08-12 ENCOUNTER — Ambulatory Visit (INDEPENDENT_AMBULATORY_CARE_PROVIDER_SITE_OTHER): Payer: PRIVATE HEALTH INSURANCE | Admitting: Internal Medicine

## 2018-08-12 ENCOUNTER — Encounter: Payer: Self-pay | Admitting: Internal Medicine

## 2018-08-12 VITALS — BP 130/86 | HR 56 | Temp 98.0°F | Ht 65.0 in | Wt 176.6 lb

## 2018-08-12 DIAGNOSIS — N951 Menopausal and female climacteric states: Secondary | ICD-10-CM | POA: Diagnosis not present

## 2018-08-12 DIAGNOSIS — R7303 Prediabetes: Secondary | ICD-10-CM | POA: Diagnosis not present

## 2018-08-12 DIAGNOSIS — I10 Essential (primary) hypertension: Secondary | ICD-10-CM | POA: Diagnosis not present

## 2018-08-12 DIAGNOSIS — R413 Other amnesia: Secondary | ICD-10-CM | POA: Diagnosis not present

## 2018-08-12 MED ORDER — ESTROGENS CONJUGATED 0.3 MG PO TABS: ORAL_TABLET | ORAL | 1 refills | Status: DC

## 2018-08-12 NOTE — Progress Notes (Signed)
Subjective:     Patient ID: [name] , female    DOB: 1960-07-25 , 58 y.o.   MRN: [id number]   Chief Complaint  Patient presents with  . hormones f/u    HPI  She is here today for f/u BHRT. She has been taking Premarin 0.59m on Mondays and Thursdays, along with estriol/testosterone vaginal cream and progesterone capsules nightly. Unfortunately, she is still having some hot flashes - not nightly, but more frequently in the last couple of weeks.     Past Medical History:  Diagnosis Date  . Current use of estrogen therapy 02/24/2015  . Depressed 04/21/2015  . Gastroesophageal reflux disease   . Hair loss 02/24/2015  . Hot flashes 02/24/2015  . Hypertension      Family History  Problem Relation Age of Onset  . Diabetes type II Mother   . Coronary artery disease Mother   . Diabetes type II Father   . Coronary artery disease Father   . Hypertension Father   . Diabetes type II Sister   . Kidney disease Sister   . Diabetes Brother   . Thyroid disease Sister   . Hypertension Sister   . Hypertension Sister   . Hypertension Brother   . Colon cancer Neg Hx   . Colon polyps Neg Hx   . Breast cancer Neg Hx   . Ovarian cancer Neg Hx   . Uterine cancer Neg Hx   . Ulcerative colitis Neg Hx   . Celiac disease Neg Hx   . Crohn's disease Neg Hx      Current Outpatient Medications:  .  ALPRAZolam (XANAX) 0.5 MG tablet, TAKE (1/2) TO (1) TABLET TWICE DAILY AS NEEDED., Disp: 30 tablet, Rfl: 4 .  Ascorbic Acid (VITAMIN C PO), Take by mouth., Disp: , Rfl:  .  BIOTIN PO, Take by mouth daily., Disp: , Rfl:  .  calcium carbonate (OS-CAL) 600 MG TABS tablet, Take 600 mg by mouth daily., Disp: , Rfl:  .  escitalopram (LEXAPRO) 10 MG tablet, Take 1 tablet (10 mg total) by mouth daily., Disp: 30 tablet, Rfl: 5 .  fish oil-omega-3 fatty acids 1000 MG capsule, Take 2 g by mouth daily., Disp: , Rfl:  .  GINKGO BILOBA PO, Take by mouth., Disp: , Rfl:  .  Multiple Vitamin (MULTIVITAMIN)  capsule, Take 1 capsule by mouth daily., Disp: , Rfl:  .  NIFEdipine (PROCARDIA-XL/NIFEDICAL-XL) 30 MG 24 hr tablet, Take 1 tablet (30 mg total) by mouth daily., Disp: 30 tablet, Rfl: 5 .  omeprazole (PRILOSEC) 20 MG capsule, Take 20 mg by mouth as needed. , Disp: , Rfl:  .  potassium chloride (K-DUR) 10 MEQ tablet, Take 1 tablet (10 mEq total) by mouth daily., Disp: 30 tablet, Rfl: 5 .  PRESCRIPTION MEDICATION, EST/TEST 0.273mml vaginal cream 12 gram. Take one ml vaginally three times a week., Disp: , Rfl:  .  Probiotic Product (ALIGN) 4 MG CAPS, Take by mouth daily., Disp: , Rfl:  .  progesterone (PROMETRIUM) 100 MG capsule, Take 100 mg by mouth daily., Disp: , Rfl:  .  vitamin B-12 (CYANOCOBALAMIN) 1000 MCG tablet, Take 1,000 mcg by mouth daily., Disp: , Rfl:  .  estrogens, conjugated, (PREMARIN) 0.3 MG tablet, One tab po daily, Disp: 30 tablet, Rfl: 1 .  fluocinonide ointment (LIDEX) 0.05 %, Apply to areas of hair loss on the scalp once daily 4 to 5 days of the week., Disp: , Rfl:  .  hydrochlorothiazide (HYDRODIURIL) 25  MG tablet, TAKE (1) TABLET BY MOUTH ONCE DAILY. (Patient not taking: Reported on 08/12/2018), Disp: 30 tablet, Rfl: 5 .  hydroxychloroquine (PLAQUENIL) 200 MG tablet, Take by mouth., Disp: , Rfl:  .  Methylsulfonylmethane (MSM) 1000 MG CAPS, Take by mouth., Disp: , Rfl:    Allergies  Allergen Reactions  . Hydrocodone Itching  . Lisinopril     Headache  Dizzy   . Metronidazole Hives  . Norvasc [Amlodipine Besylate]     Patient did not have true allergy she had side effects with medication     Review of Systems  Constitutional: Negative.   Respiratory: Negative.   Cardiovascular: Negative.   Gastrointestinal: Negative.   Neurological: Negative.   Psychiatric/Behavioral: Negative.      Today's Vitals   08/12/18 1412  BP: 130/86  Pulse: (!) 56  Temp: 98 F (36.7 C)  TempSrc: Oral  Weight: 176 lb 9.6 oz (80.1 kg)  Height: '5\' 5"'  (1.651 m)   Body mass index  is 29.39 kg/m.   Objective:  Physical Exam Vitals signs and nursing note reviewed.  Constitutional:      Appearance: Normal appearance.  HENT:     Head: Normocephalic and atraumatic.  Cardiovascular:     Rate and Rhythm: Normal rate and regular rhythm.     Heart sounds: Normal heart sounds.  Pulmonary:     Effort: Pulmonary effort is normal.     Breath sounds: Normal breath sounds.  Skin:    General: Skin is warm.  Neurological:     General: No focal deficit present.     Mental Status: She is alert.  Psychiatric:        Mood and Affect: Mood normal.        Behavior: Behavior normal.         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 She will increase dosing of Premarin to MWF. I will also decrease her dose to 0.107m MWF. My goal is to get her off of oral estrogens within the next four to six months.   - Testosterone, Total - CBC no Diff - CMP14+EGFR  2. Essential hypertension, benign  Controlled. Optimal blood pressure is less than 130/80.   3. Prediabetes  Chronic. She is encouraged to incorporate more exercise into her daily routine.   4. Memory loss  I will also check RPR. She is encouraged to exercise regularly and aim for 7 hours of sleep per night.   - TSH - Vitamin B12        RMaximino Greenland MD

## 2018-08-13 LAB — CMP14+EGFR
ALT: 13 IU/L (ref 0–32)
AST: 17 IU/L (ref 0–40)
Albumin/Globulin Ratio: 2 (ref 1.2–2.2)
Albumin: 4.5 g/dL (ref 3.8–4.9)
Alkaline Phosphatase: 103 IU/L (ref 39–117)
BUN/Creatinine Ratio: 15 (ref 9–23)
BUN: 16 mg/dL (ref 6–24)
Bilirubin Total: 0.3 mg/dL (ref 0.0–1.2)
CO2: 24 mmol/L (ref 20–29)
Calcium: 9.3 mg/dL (ref 8.7–10.2)
Chloride: 104 mmol/L (ref 96–106)
Creatinine, Ser: 1.08 mg/dL — ABNORMAL HIGH (ref 0.57–1.00)
GFR calc Af Amer: 66 mL/min/{1.73_m2} (ref >59)
GFR calc non Af Amer: 57 mL/min/{1.73_m2} — ABNORMAL LOW (ref >59)
Globulin, Total: 2.2 g/dL (ref 1.5–4.5)
Glucose: 87 mg/dL (ref 65–99)
Potassium: 4.3 mmol/L (ref 3.5–5.2)
Sodium: 145 mmol/L — ABNORMAL HIGH (ref 134–144)
Total Protein: 6.7 g/dL (ref 6.0–8.5)

## 2018-08-13 LAB — TSH: TSH: 1.17 u[IU]/mL (ref 0.450–4.500)

## 2018-08-13 LAB — CBC
HEMOGLOBIN: 13.4 g/dL (ref 11.1–15.9)
Hematocrit: 40.6 % (ref 34.0–46.6)
MCH: 28.6 pg (ref 26.6–33.0)
MCHC: 33 g/dL (ref 31.5–35.7)
MCV: 87 fL (ref 79–97)
Platelets: 266 10*3/uL (ref 150–450)
RBC: 4.68 x10E6/uL (ref 3.77–5.28)
RDW: 14.6 % (ref 11.7–15.4)
WBC: 6.8 10*3/uL (ref 3.4–10.8)

## 2018-08-13 LAB — TESTOSTERONE: Testosterone: 6 ng/dL (ref 3–41)

## 2018-08-13 LAB — RPR: RPR Ser Ql: NONREACTIVE

## 2018-08-13 LAB — VITAMIN B12: Vitamin B-12: 584 pg/mL (ref 232–1245)

## 2018-08-14 ENCOUNTER — Encounter: Payer: Self-pay | Admitting: Internal Medicine

## 2018-09-09 ENCOUNTER — Telehealth: Payer: Self-pay | Admitting: Family Medicine

## 2018-09-09 NOTE — Telephone Encounter (Signed)
Pt contacted office. Pt states she is having cough, congestion. Pt did have chills yesterday but not today. No fever, no shortness of breath. Pt has been taking Robitussion with honey since Thursday. Spoke with provider. Provider states that home care, plenty of rest, plenty of fluids, use of humidifier and call back with update in 3-4 days, sooner if symptoms worsen. Pt verbalized understanding.

## 2018-11-04 ENCOUNTER — Encounter: Payer: Self-pay | Admitting: Family Medicine

## 2018-11-04 ENCOUNTER — Other Ambulatory Visit: Payer: Self-pay

## 2018-11-04 ENCOUNTER — Ambulatory Visit (INDEPENDENT_AMBULATORY_CARE_PROVIDER_SITE_OTHER): Payer: PRIVATE HEALTH INSURANCE | Admitting: Family Medicine

## 2018-11-04 ENCOUNTER — Ambulatory Visit: Payer: PRIVATE HEALTH INSURANCE | Admitting: Family Medicine

## 2018-11-04 VITALS — Wt 179.0 lb

## 2018-11-04 DIAGNOSIS — I10 Essential (primary) hypertension: Secondary | ICD-10-CM

## 2018-11-04 DIAGNOSIS — L659 Nonscarring hair loss, unspecified: Secondary | ICD-10-CM

## 2018-11-04 MED ORDER — HYDROCHLOROTHIAZIDE 25 MG PO TABS: ORAL_TABLET | ORAL | 5 refills | Status: DC

## 2018-11-04 MED ORDER — POTASSIUM CHLORIDE ER 10 MEQ PO TBCR: EXTENDED_RELEASE_TABLET | ORAL | 5 refills | Status: DC

## 2018-11-04 MED ORDER — NIFEDIPINE ER OSMOTIC RELEASE 30 MG PO TB24: 30.0000 mg | ORAL_TABLET | Freq: Every day | ORAL | 5 refills | Status: DC

## 2018-11-04 NOTE — Progress Notes (Signed)
Subjective:  Telephone only patient did not have video capability  Patient ID: [name], female    DOB: 09-29-1960, 58 y.o.   MRN: [id number] Virtual Visit via Telephone Note  I connected with [name] on 11/04/18 at 10:00 AM EDT by telephone and verified that I am speaking with the correct person using two identifiers.  Location: Patient: Home Provider: Office   I discussed the limitations, risks, security and privacy concerns of performing an evaluation and management service by telephone and the availability of in person appointments. I also discussed with the patient that there may be a patient responsible charge related to this service. The patient expressed understanding and agreed to proceed.   History of Present Illness:    Observations/Objective:   Assessment and Plan:   Follow Up Instructions:    I discussed the assessment and treatment plan with the patient. The patient was provided an opportunity to ask questions and all were answered. The patient agreed with the plan and demonstrated an understanding of the instructions.   The patient was advised to call back or seek an in-person evaluation if the symptoms worsen or if the condition fails to improve as anticipated.  I provided 15 minutes of non-face-to-face time during this encounter.   Sallee Lange, MD   Hypertension  This is a chronic problem. Pertinent negatives include no chest pain or shortness of breath. (Pt states she has a BP machine at home but does not take BP regular; no symptoms of hypertension ) There are no compliance problems.   Hyperlipidemia  This is a chronic problem. Pertinent negatives include no chest pain or shortness of breath. There are no compliance problems.       Review of Systems  Constitutional: Negative for activity change, appetite change and fatigue.  HENT: Negative for congestion and rhinorrhea.   Respiratory: Negative for cough and shortness of breath.    Cardiovascular: Negative for chest pain and leg swelling.  Gastrointestinal: Negative for abdominal pain and diarrhea.  Endocrine: Negative for polydipsia and polyphagia.  Skin: Negative for color change.  Neurological: Negative for dizziness and weakness.  Psychiatric/Behavioral: Negative for behavioral problems and confusion.       Objective:   Physical Exam  Unable to do physical exam via telephone Patient will do multiple blood pressures over the next few weeks and send [place] a MyChart message     Assessment & Plan:  HTN- Patient was seen today as part of a visit regarding hypertension. The importance of healthy diet and regular physical activity was discussed. The importance of compliance with medications discussed.  Ideal goal is to keep blood pressure low elevated levels certainly below 836/62 when possible.  The patient was counseled that keeping blood pressure under control lessen his risk of complications.  The importance of regular follow-ups was discussed with the patient.  Low-salt diet such as DASH recommended.  Regular physical activity was recommended as well.  Patient was advised to keep regular follow-ups.  Patient currently uses HTN and potassium as needed for swelling  Patient is still losing her hair.  She is concerned about that she is seeing a dermatologist they tried her on Plaquenil she had a lot of side effects with that so she stopped taking it she is just learning to live with it.  She is interested in a pumpkin seed can help we will look into this  No labs at this time follow-up office visit in the fall with lab work  Uses Xanax very sparingly for insomnia.

## 2018-12-16 ENCOUNTER — Ambulatory Visit: Payer: PRIVATE HEALTH INSURANCE | Admitting: Internal Medicine

## 2019-01-06 ENCOUNTER — Other Ambulatory Visit: Payer: PRIVATE HEALTH INSURANCE

## 2019-01-06 ENCOUNTER — Encounter: Payer: Self-pay | Admitting: Family Medicine

## 2019-01-06 DIAGNOSIS — Z20822 Contact with and (suspected) exposure to covid-19: Secondary | ICD-10-CM

## 2019-01-06 NOTE — Telephone Encounter (Signed)
Although we can go ahead and do an additional round of antibiotics for the patient I think the patient should also consider doing COVID testing if she cannot taste-given her other sinus and congestion symptoms This testing is available across the street from the hospital please advise her If she would like to do a follow-up virtual visit we can do this as well

## 2019-01-07 MED ORDER — LEVOFLOXACIN 500 MG PO TABS: ORAL_TABLET | ORAL | 0 refills | Status: DC

## 2019-01-07 NOTE — Telephone Encounter (Signed)
Patient may utilize OTC measures including generic allergy tablet such as loratadine or fexofenadine or cetirizine  Also may use generic Flonase on a daily basis As for the antibiotic may stop doxycycline May use Levaquin 500 mg 1 daily for the next 7 days If ongoing troubles or problems to notify us  We can even arrange for an office visit next week if not improving and COVID test negative

## 2019-01-07 NOTE — Addendum Note (Signed)
Addended by: Vicente Males on: 01/07/2019 09:34 AM   Modules accepted: Orders

## 2019-01-09 LAB — NOVEL CORONAVIRUS, NAA: SARS-CoV-2, NAA: DETECTED — AB

## 2019-01-10 ENCOUNTER — Other Ambulatory Visit: Payer: Self-pay

## 2019-01-10 ENCOUNTER — Ambulatory Visit: Payer: PRIVATE HEALTH INSURANCE | Admitting: Internal Medicine

## 2019-01-10 ENCOUNTER — Telehealth: Payer: Self-pay | Admitting: Family Medicine

## 2019-01-10 ENCOUNTER — Ambulatory Visit (INDEPENDENT_AMBULATORY_CARE_PROVIDER_SITE_OTHER): Payer: PRIVATE HEALTH INSURANCE | Admitting: Family Medicine

## 2019-01-10 DIAGNOSIS — U071 COVID-19: Secondary | ICD-10-CM

## 2019-01-10 NOTE — Telephone Encounter (Signed)
Cindy from [facility] contacted office to inform us

## 2019-01-10 NOTE — Progress Notes (Signed)
   Subjective:    Patient ID: [name], female    DOB: [birth year], 58 y.o.   MRN: [id number]  HPI  Patient calls to discuss the results of her recent Covid 19 test.  The patient states that she had 2 separate visits with a tele-doctor who told her that is most likely a sinus infection she sent [place] a MyChart message at the start of July 20 week  Patient has had approximately 12 days of head congestion drainage but then she started noticed that she could not smell or taste well she denies high fever chills sweats wheezing difficulty breathing.  Her symptoms actually began around July 15 but she went and got tested at the start of this week and the test came back positive she denies any high fevers chills body aches wheezing difficulty breathing or shortness of breath she does live with her husband Virtual Visit via Video Note  I connected with [name] on 01/10/19 at  4:10 PM EDT by a video enabled telemedicine application and verified that I am speaking with the correct person using two identifiers.  Location: Patient: home Provider: office   I discussed the limitations of evaluation and management by telemedicine and the availability of in person appointments. The patient expressed understanding and agreed to proceed.  History of Present Illness:    Observations/Objective:   Assessment and Plan:   Follow Up Instructions:    I discussed the assessment and treatment plan with the patient. The patient was provided an opportunity to ask questions and all were answered. The patient agreed with the plan and demonstrated an understanding of the instructions.   The patient was advised to call back or seek an in-person evaluation if the symptoms worsen or if the condition fails to improve as anticipated.  I provided 15 minutes of non-face-to-face time during this encounter.      Review of Systems  Constitutional: Negative for activity change and fever.  HENT: Positive  for congestion and rhinorrhea. Negative for ear pain.   Eyes: Negative for discharge.  Respiratory: Negative for cough, shortness of breath and wheezing.   Cardiovascular: Negative for chest pain.       Objective:   Physical Exam   Patient had virtual visit Appears to be in no distress Atraumatic Neuro able to relate and oriented No apparent resp distress Color normal      Assessment & Plan:  Positive COVID Supportive measures discussed Self-isolation discussed.  Patient is actually improving.  I believe if she keeps herself self isolated through July 27 she will need 10-day criteria.  I also encouraged that her husband stay self isolated through July 29.  Also recommend the husband consider going and get tested.  Warning signs regarding COVID were discussed in detail 15 minutes was spent with patient today discussing healthcare issues which they came.  More than 50% of this visit-total duration of visit-was spent in counseling and coordination of care.  Please see diagnosis regarding the focus of this coordination and care Warning signs regarding COVID was told to the patient

## 2019-01-10 NOTE — Telephone Encounter (Signed)
Pt has tested positive for COVID. The pt has not been notified yet nor has the health dept.

## 2019-01-10 NOTE — Telephone Encounter (Signed)
COVID test positive Notify patient Patient should do a virtual visit this afternoon-I can do this near 4:00-significant amount of information will need to be discussed Patient should self isolate for now May notify health department as well please

## 2019-01-10 NOTE — Telephone Encounter (Signed)
Contacted patient and informed her the COVID test was positive and that Dr.Scott would like to do a virtual visit with her this afternoon around 4 pm. Pt verbalized understanding. Contacted health dept and reported positive case to Norberta Keens ([facility] nurse)

## 2019-01-19 ENCOUNTER — Encounter: Payer: Self-pay | Admitting: Family Medicine

## 2019-01-21 ENCOUNTER — Other Ambulatory Visit: Payer: Self-pay | Admitting: Internal Medicine

## 2019-01-21 DIAGNOSIS — Z20822 Contact with and (suspected) exposure to covid-19: Secondary | ICD-10-CM

## 2019-01-22 LAB — NOVEL CORONAVIRUS, NAA: SARS-CoV-2, NAA: NOT DETECTED

## 2019-01-28 ENCOUNTER — Other Ambulatory Visit: Payer: Self-pay | Admitting: Internal Medicine

## 2019-03-06 ENCOUNTER — Encounter: Payer: Self-pay | Admitting: Family Medicine

## 2019-03-06 ENCOUNTER — Ambulatory Visit (INDEPENDENT_AMBULATORY_CARE_PROVIDER_SITE_OTHER): Payer: PRIVATE HEALTH INSURANCE | Admitting: Family Medicine

## 2019-03-06 ENCOUNTER — Other Ambulatory Visit: Payer: Self-pay

## 2019-03-06 DIAGNOSIS — M25473 Effusion, unspecified ankle: Secondary | ICD-10-CM | POA: Diagnosis not present

## 2019-03-06 NOTE — Progress Notes (Signed)
   Subjective:  Audio plus video Most  Patient ID world and ered. The patient agreed with the plan and demonstrated an understanding of the instructions.   The patient was advised to call back or seek an in-person evaluation if the symptoms worsen or if the condition fails to improve as anticipated.  I provided 18 minutes of non-face-to-face time during this encounter.    Patient notes swollen ankles.  Has had this for quite a while.  Has been prescribed hydrochlorothiazide and potassium for this in the past.  To use PRN.  Patient notes more swelling the past week.  She is also on Norvasc which can add to swelling.  No shortness of breath no chest pain    Review of Systems No headache, no major weight loss or weight gain, no chest pain no back pain abdominal pain no change in bowel habits complete ROS otherwise negative     Objective:   Physical Exam  Virtual      Assessment & Plan:  Impression ankle edema.  Likely combination of venous stasis and Norvasc side effect.  Discussed.  Encouraged use of hydrochlorothiazide and potassium as prescribed already the next 3 days and then as needed after that for swelling

## 2019-03-08 ENCOUNTER — Encounter: Payer: Self-pay | Admitting: Family Medicine

## 2019-03-19 ENCOUNTER — Other Ambulatory Visit: Payer: Self-pay | Admitting: *Deleted

## 2019-03-19 NOTE — Telephone Encounter (Signed)
May have this +1 refill needs to schedule follow-up visit

## 2019-03-20 MED ORDER — ESCITALOPRAM OXALATE 10 MG PO TABS: 10.0000 mg | ORAL_TABLET | Freq: Every day | ORAL | 0 refills | Status: DC

## 2019-03-20 NOTE — Telephone Encounter (Signed)
Patient has appointment on 10/27 for 3 month follow up on medication

## 2019-03-24 ENCOUNTER — Encounter: Payer: Self-pay | Admitting: Internal Medicine

## 2019-03-24 ENCOUNTER — Other Ambulatory Visit: Payer: Self-pay

## 2019-03-24 ENCOUNTER — Ambulatory Visit (INDEPENDENT_AMBULATORY_CARE_PROVIDER_SITE_OTHER): Payer: PRIVATE HEALTH INSURANCE | Admitting: Internal Medicine

## 2019-03-24 VITALS — BP 132/78 | HR 66 | Temp 98.1°F | Ht 64.0 in | Wt 181.0 lb

## 2019-03-24 DIAGNOSIS — F5101 Primary insomnia: Secondary | ICD-10-CM | POA: Diagnosis not present

## 2019-03-24 DIAGNOSIS — Z79899 Other long term (current) drug therapy: Secondary | ICD-10-CM

## 2019-03-24 DIAGNOSIS — M25512 Pain in left shoulder: Secondary | ICD-10-CM | POA: Diagnosis not present

## 2019-03-24 DIAGNOSIS — M2550 Pain in unspecified joint: Secondary | ICD-10-CM | POA: Diagnosis not present

## 2019-03-24 DIAGNOSIS — M79609 Pain in unspecified limb: Secondary | ICD-10-CM

## 2019-03-24 DIAGNOSIS — E6609 Other obesity due to excess calories: Secondary | ICD-10-CM

## 2019-03-24 DIAGNOSIS — N951 Menopausal and female climacteric states: Secondary | ICD-10-CM | POA: Diagnosis not present

## 2019-03-24 DIAGNOSIS — R202 Paresthesia of skin: Secondary | ICD-10-CM

## 2019-03-24 DIAGNOSIS — Z6831 Body mass index (BMI) 31.0-31.9, adult: Secondary | ICD-10-CM

## 2019-03-24 DIAGNOSIS — M791 Myalgia, unspecified site: Secondary | ICD-10-CM

## 2019-03-24 MED ORDER — ESTROGENS CONJUGATED 0.3 MG PO TABS: ORAL_TABLET | ORAL | 2 refills | Status: DC

## 2019-03-24 NOTE — Patient Instructions (Addendum)
Joint Pain  Joint pain can be caused by many things. It is likely to go away if you follow instructions from your doctor for taking care of yourself at home. Sometimes, you may need more treatment. Follow these instructions at home: Managing pain, stiffness, and swelling   If told, put ice on the painful area. ? Put ice in a plastic bag. ? Place a towel between your skin and the bag. ? Leave the ice on for 20 minutes, 2-3 times a day.  If told, put heat on the painful area. Do this as often as told by your doctor. Use the heat source that your doctor recommends, such as a moist heat pack or a heating pad. ? Place a towel between your skin and the heat source. ? Leave the heat on for 20-30 minutes. ? Take off the heat if your skin gets bright red. This is especially important if you are unable to feel pain, heat, or cold. You may have a greater risk of getting burned.  Move your fingers or toes below the painful joint often. This helps with stiffness and swelling.  If possible, raise (elevate) the painful joint above the level of your heart while you are sitting or lying down. To do this, try putting a few pillows under the painful joint. Activity  Rest the painful joint for as long as told. Do not do things that cause pain or make your pain worse.  Begin exercising or stretching the affected area, as told by your doctor. Ask your doctor what types of exercise are safe for you. If you have an elastic bandage, sling, or splint:  Wear the device as told by your doctor. Take it off only as told by your doctor.  Loosen the device if your fingers or toes below the joint: ? Tingle. ? Lose feeling (get numb). ? Get cold and blue.  Keep the device clean.  Ask your doctor if you should take off the device before bathing. You may need to cover it with a watertight covering when you take a bath or a shower. General instructions  Take over-the-counter and prescription medicines only as told  by your doctor.  Do not use any products that contain nicotine or tobacco. These include cigarettes and e-cigarettes. If you need help quitting, ask your doctor.  Keep all follow-up visits as told by your doctor. This is important. Contact a doctor if:  You have pain that gets worse and does not get better with medicine.  Your joint pain does not get better in 3 days.  You have more bruising or swelling.  You have a fever.  You lose 10 lb (4.5 kg) or more without trying. Get help right away if:  You cannot move the joint.  Your fingers or toes tingle, lose feeling, or get cold and blue.  You have a fever along with a joint that is red, warm, and swollen. Summary  Joint pain can be caused by many things. It often goes away if you follow instructions from your doctor for taking care of yourself at home.  Rest the painful joint for as long as told. Do not do things that cause pain or make your pain worse.  Take over-the-counter and prescription medicines only as told by your doctor. This information is not intended to replace advice given to you by your health care provider. Make sure you discuss any questions you have with your health care provider. Document Released: 05/24/2009 Document Revised: 05/18/2017 Document  Reviewed: 03/21/2017 Elsevier Patient Education  [employer].   Myofascial Pain Syndrome and Fibromyalgia Myofascial pain syndrome and fibromyalgia are both pain disorders. This pain may be felt mainly in your muscles.  Myofascial pain syndrome: ? Always has tender points in the muscle that will cause pain when pressed (trigger points). The pain may come and go. ? Usually affects your neck, upper back, and shoulder areas. The pain often radiates into your arms and hands.  Fibromyalgia: ? Has muscle pains and tenderness that come and go. ? Is often associated with fatigue and sleep problems. ? Has trigger points. ? Tends to be long-lasting (chronic), but  is not life-threatening. Fibromyalgia and myofascial pain syndrome are not the same. However, they often occur together. If you have both conditions, each can make the other worse. Both are common and can cause enough pain and fatigue to make day-to-day activities difficult. Both can be hard to diagnose because their symptoms are common in many other conditions. What are the causes? The exact causes of these conditions are not known. What increases the risk? You are more likely to develop this condition if:  You have a family history of the condition.  You have certain triggers, such as: ? Spine disorders. ? An injury (trauma) or other physical stressors. ? Being under a lot of stress. ? Medical conditions such as osteoarthritis, rheumatoid arthritis, or lupus. What are the signs or symptoms? Fibromyalgia The main symptom of fibromyalgia is widespread pain and tenderness in your muscles. Pain is sometimes described as stabbing, shooting, or burning. You may also have:  Tingling or numbness.  Sleep problems and fatigue.  Problems with attention and concentration (fibro fog). Other symptoms may include:  Bowel and bladder problems.  Headaches.  Visual problems.  Problems with odors and noises.  Depression or mood changes.  Painful menstrual periods (dysmenorrhea).  Dry skin or eyes. These symptoms can vary over time. Myofascial pain syndrome Symptoms of myofascial pain syndrome include:  Tight, ropy bands of muscle.  Uncomfortable sensations in muscle areas. These may include aching, cramping, burning, numbness, tingling, and weakness.  Difficulty moving certain parts of the body freely (poor range of motion). How is this diagnosed? This condition may be diagnosed by your symptoms and medical history. You will also have a physical exam. In general:  Fibromyalgia is diagnosed if you have pain, fatigue, and other symptoms for more than 3 months, and symptoms cannot be  explained by another condition.  Myofascial pain syndrome is diagnosed if you have trigger points in your muscles, and those trigger points are tender and cause pain elsewhere in your body (referred pain). How is this treated? Treatment for these conditions depends on the type that you have.  For fibromyalgia: ? Pain medicines, such as NSAIDs. ? Medicines for treating depression. ? Medicines for treating seizures. ? Medicines that relax the muscles.  For myofascial pain: ? Pain medicines, such as NSAIDs. ? Cooling and stretching of muscles. ? Trigger point injections. ? Sound wave (ultrasound) treatments to stimulate muscles. Treating these conditions often requires a team of health care providers. These may include:  Your primary care provider.  Physical therapist.  Complementary health care providers, such as massage therapists or acupuncturists.  Psychiatrist for cognitive behavioral therapy. Follow these instructions at home: Medicines  Take over-the-counter and prescription medicines only as told by your health care provider.  Do not drive or use heavy machinery while taking prescription pain medicine.  If you are taking prescription  pain medicine, take actions to prevent or treat constipation. Your health care provider may recommend that you: ? Drink enough fluid to keep your urine pale yellow. ? Eat foods that are high in fiber, such as fresh fruits and vegetables, whole grains, and beans. ? Limit foods that are high in fat and processed sugars, such as fried or sweet foods. ? Take an over-the-counter or prescription medicine for constipation. Lifestyle   Exercise as directed by your health care provider or physical therapist.  Practice relaxation techniques to control your stress. You may want to try: ? Biofeedback. ? Visual imagery. ? Hypnosis. ? Muscle relaxation. ? Yoga. ? Meditation.  Maintain a healthy lifestyle. This includes eating a healthy diet and  getting enough sleep.  Do not use any products that contain nicotine or tobacco, such as cigarettes and e-cigarettes. If you need help quitting, ask your health care provider. General instructions  Talk to your health care provider about complementary treatments, such as acupuncture or massage.  Consider joining a support group with others who are diagnosed with this condition.  Do not do activities that stress or strain your muscles. This includes repetitive motions and heavy lifting.  Keep all follow-up visits as told by your health care provider. This is important. Where to find more information  National Fibromyalgia Association: www.fmaware.[place]: www.arthritis.org  American Chronic Pain Association: www.theacpa.org Contact a health care provider if:  You have new symptoms.  Your symptoms get worse or your pain is severe.  You have side effects from your medicines.  You have trouble sleeping.  Your condition is causing depression or anxiety. Summary  Myofascial pain syndrome and fibromyalgia are pain disorders.  Myofascial pain syndrome has tender points in the muscle that will cause pain when pressed (trigger points). Fibromyalgia also has muscle pains and tenderness that come and go, but this condition is often associated with fatigue and sleep disturbances.  Fibromyalgia and myofascial pain syndrome are not the same but often occur together, causing pain and fatigue that make day-to-day activities difficult.  Treatment for fibromyalgia includes taking medicines to relax the muscles and medicines for pain, depression, or seizures. Treatment for myofascial pain syndrome includes taking medicines for pain, cooling and stretching of muscles, and injecting medicines into trigger points.  Follow your health care provider's instructions for taking medicines and maintaining a healthy lifestyle. This information is not intended to replace advice given to  you by your health care provider. Make sure you discuss any questions you have with your health care provider. Document Released: 06/05/2005 Document Revised: 09/27/2018 Document Reviewed: 06/20/2017 Elsevier Patient Education  2020 [employer].

## 2019-03-25 LAB — CMP14+EGFR
ALT: 12 IU/L (ref 0–32)
AST: 16 IU/L (ref 0–40)
Albumin/Globulin Ratio: 1.8 (ref 1.2–2.2)
Albumin: 4.5 g/dL (ref 3.8–4.9)
Alkaline Phosphatase: 109 IU/L (ref 39–117)
BUN/Creatinine Ratio: 14 (ref 9–23)
BUN: 18 mg/dL (ref 6–24)
Bilirubin Total: 0.3 mg/dL (ref 0.0–1.2)
CO2: 23 mmol/L (ref 20–29)
Calcium: 9.5 mg/dL (ref 8.7–10.2)
Chloride: 106 mmol/L (ref 96–106)
Creatinine, Ser: 1.27 mg/dL — ABNORMAL HIGH (ref 0.57–1.00)
GFR calc Af Amer: 54 mL/min/{1.73_m2} — ABNORMAL LOW (ref >59)
GFR calc non Af Amer: 47 mL/min/{1.73_m2} — ABNORMAL LOW (ref >59)
Globulin, Total: 2.5 g/dL (ref 1.5–4.5)
Glucose: 90 mg/dL (ref 65–99)
Potassium: 4 mmol/L (ref 3.5–5.2)
Sodium: 142 mmol/L (ref 134–144)
Total Protein: 7 g/dL (ref 6.0–8.5)

## 2019-03-25 LAB — CYCLIC CITRUL PEPTIDE ANTIBODY, IGG/IGA: Cyclic Citrullin Peptide Ab: 6 units (ref 0–19)

## 2019-03-25 LAB — SEDIMENTATION RATE: Sed Rate: 13 mm/hr (ref 0–40)

## 2019-03-25 LAB — URIC ACID: Uric Acid: 4.4 mg/dL (ref 2.5–7.1)

## 2019-03-25 LAB — RHEUMATOID FACTOR: Rheumatoid fact SerPl-aCnc: <10 IU/mL (ref 0.0–13.9)

## 2019-03-25 LAB — ANTINUCLEAR ANTIBODIES, IFA: ANA Titer 1: NEGATIVE

## 2019-03-25 LAB — CK: Total CK: 182 U/L (ref 32–182)

## 2019-03-27 ENCOUNTER — Encounter: Payer: Self-pay | Admitting: Internal Medicine

## 2019-04-09 ENCOUNTER — Encounter: Payer: Self-pay | Admitting: Family Medicine

## 2019-04-09 NOTE — Telephone Encounter (Signed)
NEEDS OV or urgent care visit Can see in the morning

## 2019-04-10 ENCOUNTER — Other Ambulatory Visit: Payer: Self-pay

## 2019-04-10 ENCOUNTER — Ambulatory Visit: Payer: PRIVATE HEALTH INSURANCE | Admitting: Family Medicine

## 2019-04-10 ENCOUNTER — Encounter: Payer: Self-pay | Admitting: Family Medicine

## 2019-04-10 ENCOUNTER — Other Ambulatory Visit (HOSPITAL_COMMUNITY)
Admission: RE | Admit: 2019-04-10 | Discharge: 2019-04-10 | Disposition: A | Discharge status: Home / Self Care | Payer: PRIVATE HEALTH INSURANCE | Source: Ambulatory Visit | Attending: Family Medicine | Admitting: Family Medicine

## 2019-04-10 ENCOUNTER — Ambulatory Visit (HOSPITAL_COMMUNITY)
Admission: RE | Admit: 2019-04-10 | Discharge: 2019-04-10 | Disposition: A | Discharge status: Home / Self Care | Payer: PRIVATE HEALTH INSURANCE | Source: Ambulatory Visit | Attending: Family Medicine | Admitting: Family Medicine

## 2019-04-10 VITALS — BP 128/76 | Temp 95.4°F | Wt 181.2 lb

## 2019-04-10 DIAGNOSIS — M79662 Pain in left lower leg: Secondary | ICD-10-CM | POA: Insufficient documentation

## 2019-04-10 DIAGNOSIS — R600 Localized edema: Secondary | ICD-10-CM

## 2019-04-10 DIAGNOSIS — R609 Edema, unspecified: Secondary | ICD-10-CM | POA: Diagnosis not present

## 2019-04-10 DIAGNOSIS — I10 Essential (primary) hypertension: Secondary | ICD-10-CM

## 2019-04-10 DIAGNOSIS — F329 Major depressive disorder, single episode, unspecified: Secondary | ICD-10-CM

## 2019-04-10 DIAGNOSIS — F32A Depression, unspecified: Secondary | ICD-10-CM

## 2019-04-10 DIAGNOSIS — F419 Anxiety disorder, unspecified: Secondary | ICD-10-CM

## 2019-04-10 LAB — D-DIMER, QUANTITATIVE: D-Dimer, Quant: 1.37 ug/mL-FEU — ABNORMAL HIGH (ref 0.00–0.50)

## 2019-04-10 MED ORDER — NIFEDIPINE ER OSMOTIC RELEASE 30 MG PO TB24: 30.0000 mg | ORAL_TABLET | Freq: Every day | ORAL | 5 refills | Status: DC

## 2019-04-10 MED ORDER — POTASSIUM CHLORIDE ER 10 MEQ PO TBCR: EXTENDED_RELEASE_TABLET | ORAL | 5 refills | Status: DC

## 2019-04-10 MED ORDER — ESCITALOPRAM OXALATE 10 MG PO TABS: 10.0000 mg | ORAL_TABLET | Freq: Every day | ORAL | 6 refills | Status: DC

## 2019-04-10 MED ORDER — HYDROCHLOROTHIAZIDE 25 MG PO TABS: ORAL_TABLET | ORAL | 5 refills | Status: DC

## 2019-04-10 NOTE — Progress Notes (Signed)
Subjective:    Patient ID: [name], female    DOB: 10/25/1960, 58 y.o.   MRN: [id number]  HPI Pt is having left leg swelling. Pt states that her leg usually swollen by the end of day. Foot and ankle are also swollen. Pt states that she is having pain all the way up leg. Pt has been taking Tylenol Arthritis. Pt is also losing her balance frequently. Pt takes fluid pill (HCTZ) every other day.  Significant swelling in the calf and also into the foot been present over the past several days.  She also states she has been having burning pain down the leg that is been off and on for weeks.  PMH benign  Patient also has underlying stress related issues takes Lexapro on a regular basis states it does a good job for her she would like to continue it She also states she takes her blood pressure medicine on a regular basis denies missing it minimizes salt intake.  Does not smoke or drink  Review of Systems  Constitutional: Negative for activity change and appetite change.  HENT: Negative for congestion and rhinorrhea.   Respiratory: Negative for cough and shortness of breath.   Cardiovascular: Negative for chest pain and leg swelling.  Gastrointestinal: Negative for abdominal pain, nausea and vomiting.  Skin: Negative for color change.  Neurological: Negative for dizziness and weakness.  Psychiatric/Behavioral: Negative for agitation and confusion.       Objective:   Physical Exam Vitals signs reviewed.  Constitutional:      General: She is not in acute distress. HENT:     Head: Normocephalic and atraumatic.  Eyes:     General:        Right eye: No discharge.        Left eye: No discharge.  Neck:     Trachea: No tracheal deviation.  Cardiovascular:     Rate and Rhythm: Normal rate and regular rhythm.     Heart sounds: Normal heart sounds. No murmur.  Pulmonary:     Effort: Pulmonary effort is normal. No respiratory distress.     Breath sounds: Normal breath sounds.   Lymphadenopathy:     Cervical: No cervical adenopathy.  Skin:    General: Skin is warm and dry.  Neurological:     Mental Status: She is alert.     Coordination: Coordination normal.  Psychiatric:        Behavior: Behavior normal.    The calf is swollen on the left side compared to the right side the foot is slightly swollen.  She also has positive straight leg raise on the left side       Assessment & Plan:  HTN good control continue current measures follow-up if ongoing troubles  Pedal edema knee-high support hoses recommended.  Swollen left calf need to go ahead and do stat ultrasound and stat D-dimer. Ultrasound came back no DVT D-dimer came back positive We will repeat a ultrasound next Wednesday follow-up sooner problems  Sciatica left leg should gradually get better over the next several weeks if not notify us we may refer her to back specialist for injections  25 minutes was spent with the patient.  This statement verifies that 25 minutes was indeed spent with the patient.  More than 50% of this visit-total duration of the visit-was spent in counseling and coordination of care. The issues that the patient came in for today as reflected in the diagnosis (s) please refer to documentation for  further details.

## 2019-04-13 NOTE — Progress Notes (Signed)
Subjective:     Patient ID: [name] , female    DOB: 07/12/1960 , 58 y.o.   MRN: [id number]   Chief Complaint  Patient presents with  . Other    BHRT f/u    HPI  She is here today for BHRT f/u. She feels well on her current regimen. Unfortunately, she is still taking oral estrogen three days per week. She is concerned that her hot flashes will worsen when transitioning from oral to topical estrogen formulation. She reports compliance with progesterone supplementation.     Past Medical History:  Diagnosis Date  . Current use of estrogen therapy 02/24/2015  . Depressed 04/21/2015  . Gastroesophageal reflux disease   . Hair loss 02/24/2015  . Hot flashes 02/24/2015  . Hypertension      Family History  Problem Relation Age of Onset  . Diabetes type II Mother   . Coronary artery disease Mother   . Diabetes type II Father   . Coronary artery disease Father   . Hypertension Father   . Diabetes type II Sister   . Kidney disease Sister   . Diabetes Brother   . Thyroid disease Sister   . Hypertension Sister   . Hypertension Sister   . Hypertension Brother   . Colon cancer Neg Hx   . Colon polyps Neg Hx   . Breast cancer Neg Hx   . Ovarian cancer Neg Hx   . Uterine cancer Neg Hx   . Ulcerative colitis Neg Hx   . Celiac disease Neg Hx   . Crohn's disease Neg Hx      Current Outpatient Medications:  .  ALPRAZolam (XANAX) 0.5 MG tablet, TAKE (1/2) TO (1) TABLET TWICE DAILY AS NEEDED., Disp: 30 tablet, Rfl: 4 .  Ascorbic Acid (VITAMIN C PO), Take by mouth., Disp: , Rfl:  .  BIOTIN PO, Take by mouth daily., Disp: , Rfl:  .  estrogens, conjugated, (PREMARIN) 0.3 MG tablet, TAKE ONE TABLET BY MOUTH ONCE DAILY., Disp: 30 tablet, Rfl: 2 .  fish oil-omega-3 fatty acids 1000 MG capsule, Take 2 g by mouth daily., Disp: , Rfl:  .  fluocinonide ointment (LIDEX) 0.05 %, Apply to areas of hair loss on the scalp once daily 4 to 5 days of the week., Disp: , Rfl:  .  GINKGO BILOBA PO,  Take by mouth., Disp: , Rfl:  .  Multiple Vitamin (MULTIVITAMIN) capsule, Take 1 capsule by mouth daily., Disp: , Rfl:  .  omeprazole (PRILOSEC) 20 MG capsule, Take 20 mg by mouth as needed. , Disp: , Rfl:  .  PRESCRIPTION MEDICATION, EST/TEST 0.33m/ml vaginal cream 12 gram. Take one ml vaginally three times a week., Disp: , Rfl:  .  Probiotic Product (ALIGN) 4 MG CAPS, Take by mouth daily., Disp: , Rfl:  .  progesterone (PROMETRIUM) 100 MG capsule, Take 100 mg by mouth daily., Disp: , Rfl:  .  vitamin B-12 (CYANOCOBALAMIN) 1000 MCG tablet, Take 1,000 mcg by mouth daily., Disp: , Rfl:  .  escitalopram (LEXAPRO) 10 MG tablet, Take 1 tablet (10 mg total) by mouth daily., Disp: 30 tablet, Rfl: 6 .  hydrochlorothiazide (HYDRODIURIL) 25 MG tablet, TAKE (1) TABLET BY MOUTH PRN, Disp: 30 tablet, Rfl: 5 .  NIFEdipine (PROCARDIA-XL/NIFEDICAL-XL) 30 MG 24 hr tablet, Take 1 tablet (30 mg total) by mouth daily., Disp: 30 tablet, Rfl: 5 .  potassium chloride (KLOR-CON) 10 MEQ tablet, Take one tablet by mouth prn, Disp: 30 tablet, Rfl: 5  Allergies  Allergen Reactions  . Plaquenil [Hydroxychloroquine Sulfate]     Itching hives  . Hydrocodone Itching  . Lisinopril     Headache  Dizzy   . Metronidazole Hives  . Norvasc [Amlodipine Besylate]     Patient did not have true allergy she had side effects with medication     Review of Systems  Constitutional: Negative.   Respiratory: Negative.   Cardiovascular: Negative.   Gastrointestinal: Negative.   Neurological: Positive for numbness.       She c/o tingling in her LUE. She is unable to state what triggers her sx. She denies LUE weakness.   Psychiatric/Behavioral: Negative.      Today's Vitals   03/24/19 1607  BP: 132/78  Pulse: 66  Temp: 98.1 F (36.7 C)  TempSrc: Oral  SpO2: 97%  Weight: 181 lb (82.1 kg)  Height: '5\' 4"'  (1.626 m)   Body mass index is 31.07 kg/m.   Objective:  Physical Exam Vitals signs and nursing note reviewed.   Constitutional:      Appearance: Normal appearance.  HENT:     Head: Normocephalic and atraumatic.  Neck:     Musculoskeletal: Normal range of motion. Muscular tenderness present.     Comments: Trapezius mm. Tender to palpation b/l Cardiovascular:     Rate and Rhythm: Normal rate and regular rhythm.     Heart sounds: Normal heart sounds.  Pulmonary:     Effort: Pulmonary effort is normal.     Breath sounds: Normal breath sounds.  Skin:    General: Skin is warm.  Neurological:     General: No focal deficit present.     Mental Status: She is alert.  Psychiatric:        Mood and Affect: Mood normal.        Behavior: Behavior normal.         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 I would like for her to switch from oral estrogen to a topical formulation. She is reminded of increased risk of clotting with oral estrogens. Again, she does not wish to transition at this time. She is reminded that it is best to switch in cooler months, than in the Summer. She will consider at her next visit. She will rto in four months for re-evaluation.   2. Primary insomnia  Chronic. Importance of having a bedtime routine was discussed with the patient in detail. She is also encouraged to take a magnesium supplement nightly. She will let me know if her sx persist.   3. Arthralgia, unspecified joint  I will check an arthritis panel. She is encouraged to follow an anti-inflammatory diet free of refined sugars, sugary beverages and packaged foods.   - ANA, IFA (with reflex) - CYCLIC CITRUL PEPTIDE ANTIBODY, IGG/IGA - Rheumatoid factor - Sedimentation rate - Uric acid  4. Paresthesia and pain of left extremity  She is advised to apply topical pain cream to left shoulder/neck. She will consult with her PCP should her sx persist.   5. Myalgia  She is encouraged to stay well hydrated and to aim for at least 80 ounces of water daily. Again, importance of magnesium  supplementation was discussed with the patient.   - CK, total  6. Class 1 obesity due to excess calories with serious comorbidity and body mass index (BMI) of 31.0 to 31.9 in adult  Importance of achieving optimal weight to decrease risk of cardiovascular disease and cancers was discussed  with the patient in full detail. Importance of regular exercise was discussed with the patient.  She is encouraged to start slowly - start with 10 minutes twice daily at least three to four days per week and to gradually build to 30 minutes five days weekly. She was given tips to incorporate more activity into her daily routine - take stairs when possible, park farther away from her job, grocery stores, etc.    7. Drug therapy  - CMP14+EGFR        [name], MD    THE PATIENT IS ENCOURAGED TO PRACTICE SOCIAL DISTANCING DUE TO THE COVID-19 PANDEMIC.

## 2019-04-15 ENCOUNTER — Ambulatory Visit: Payer: PRIVATE HEALTH INSURANCE | Admitting: Family Medicine

## 2019-04-15 ENCOUNTER — Encounter: Payer: Self-pay | Admitting: Family Medicine

## 2019-04-15 ENCOUNTER — Ambulatory Visit (HOSPITAL_COMMUNITY)
Admission: RE | Admit: 2019-04-15 | Discharge: 2019-04-15 | Disposition: A | Discharge status: Home / Self Care | Payer: PRIVATE HEALTH INSURANCE | Source: Ambulatory Visit | Attending: Family Medicine | Admitting: Family Medicine

## 2019-04-15 ENCOUNTER — Other Ambulatory Visit: Payer: Self-pay

## 2019-04-15 DIAGNOSIS — R600 Localized edema: Secondary | ICD-10-CM | POA: Diagnosis not present

## 2019-04-15 DIAGNOSIS — M79662 Pain in left lower leg: Secondary | ICD-10-CM | POA: Insufficient documentation

## 2019-04-15 DIAGNOSIS — M5432 Sciatica, left side: Secondary | ICD-10-CM

## 2019-04-15 NOTE — Telephone Encounter (Signed)
I believe the patient is having pain that is due to an impingement of the nerve in her back  Nurses The patient has options of going back into neurosurgery and one of their specialist can do the injection Or referral to Dr.Ramos in [place] and their group does injections as well  Typically it would make most sense to go back to neurosurgery so that they can help set her up for injections within their group  After patient indicates her preference-please assist by going ahead and initiating referral and letting the patient  know that this is been done plus also the process  Thanks

## 2019-04-15 NOTE — Addendum Note (Signed)
Addended by: Dairl Ponder on: 04/15/2019 02:24 PM   Modules accepted: Orders

## 2019-04-25 ENCOUNTER — Encounter: Payer: Self-pay | Admitting: Family Medicine

## 2019-04-28 ENCOUNTER — Encounter: Payer: Self-pay | Admitting: Family Medicine

## 2019-05-30 ENCOUNTER — Encounter: Payer: PRIVATE HEALTH INSURANCE | Admitting: Family Medicine

## 2019-05-30 ENCOUNTER — Encounter: Payer: Self-pay | Admitting: Nurse Practitioner

## 2019-05-30 ENCOUNTER — Ambulatory Visit (INDEPENDENT_AMBULATORY_CARE_PROVIDER_SITE_OTHER): Payer: PRIVATE HEALTH INSURANCE | Admitting: Nurse Practitioner

## 2019-05-30 ENCOUNTER — Other Ambulatory Visit: Payer: Self-pay

## 2019-05-30 VITALS — BP 132/80 | Temp 97.2°F | Ht 64.0 in | Wt 184.0 lb

## 2019-05-30 DIAGNOSIS — Z1231 Encounter for screening mammogram for malignant neoplasm of breast: Secondary | ICD-10-CM | POA: Diagnosis not present

## 2019-05-30 DIAGNOSIS — R7303 Prediabetes: Secondary | ICD-10-CM | POA: Diagnosis not present

## 2019-05-30 DIAGNOSIS — Z13220 Encounter for screening for lipoid disorders: Secondary | ICD-10-CM

## 2019-05-30 DIAGNOSIS — I10 Essential (primary) hypertension: Secondary | ICD-10-CM

## 2019-05-30 DIAGNOSIS — Z01419 Encounter for gynecological examination (general) (routine) without abnormal findings: Secondary | ICD-10-CM

## 2019-05-30 NOTE — Progress Notes (Signed)
Subjective:    Patient ID: [name], female    DOB: 09-Aug-1960, 58 y.o.   MRN: [id number]  HPI The patient comes in today for a wellness visit.    A review of their health history was completed.  A review of medications was also completed.  Any needed refills; none  Eating habits: Trying to eat healthy  Falls/  MVA accidents in past few months: none  Regular exercise: minimal  Specialist pt sees on regular basis: Dr. Baird Cancer for hormones  Preventative health issues were discussed.   Additional concerns: requests a prescription for massage due to chronic muscle spasms and pain.  Married, same sexual partner. SH: hysterectomy and BSO.  Regular dental care. Plans to get eye exam.    Office Visit from 05/30/2019 in [place]  PHQ-2 Total Score  0     GAD 7 : Generalized Anxiety Score 05/30/2019 05/03/2018 10/22/2017  Nervous, Anxious, on Edge 0 0 0  Control/stop worrying 0 0 0  Worry too much - different things 0 0 0  Trouble relaxing 0 0 1  Restless 0 0 0  Easily annoyed or irritable 0 0 0  Afraid - awful might happen 0 0 0  Total GAD 7 Score 0 0 1  Anxiety Difficulty - Not difficult at all Not difficult at all       Review of Systems  Constitutional: Negative for activity change, appetite change and fever.  Respiratory: Negative for cough, chest tightness, shortness of breath and wheezing.   Cardiovascular: Negative for chest pain.  Gastrointestinal: Negative for abdominal distention, abdominal pain, blood in stool, constipation, diarrhea, nausea and vomiting.  Genitourinary: Negative for difficulty urinating, dysuria, enuresis, frequency, genital sores, pelvic pain, urgency and vaginal discharge.  Musculoskeletal: Positive for myalgias.       Objective:   Physical Exam Constitutional:      General: She is not in acute distress.    Appearance: She is well-developed.  Neck:     Thyroid: No thyromegaly.     Trachea: No tracheal  deviation.  Cardiovascular:     Rate and Rhythm: Normal rate and regular rhythm.     Heart sounds: Normal heart sounds. No murmur. No gallop.   Pulmonary:     Effort: Pulmonary effort is normal.     Breath sounds: Normal breath sounds.  Chest:     Breasts:        Right: Normal. No mass, skin change or tenderness.        Left: Normal. No mass, skin change or tenderness.  Abdominal:     General: There is no distension.     Palpations: Abdomen is soft.     Tenderness: There is no abdominal tenderness.  Genitourinary:    Vagina: Normal.     Comments: External GU: no rashes or lesions. Vagina: no discharge or lesions.  Musculoskeletal:     Cervical back: Normal range of motion and neck supple.  Lymphadenopathy:     Cervical: No cervical adenopathy.     Upper Body:     Right upper body: No supraclavicular, axillary or pectoral adenopathy.     Left upper body: No supraclavicular, axillary or pectoral adenopathy.  Skin:    General: Skin is warm and dry.     Findings: No rash.  Neurological:     Mental Status: She is alert and oriented to person, place, and time.  Psychiatric:        Mood and Affect: Mood normal.  Behavior: Behavior normal.        Thought Content: Thought content normal.        Judgment: Judgment normal.           Assessment & Plan:   Problem List Items Addressed This Visit      Cardiovascular and Mediastinum   Essential hypertension, benign   Relevant Orders   Basic Metabolic Panel (BMET) (Completed)     Other   Prediabetes    Other Visit Diagnoses    Well woman exam    -  Primary   Relevant Orders   Lipid Profile (Completed)   Basic Metabolic Panel (BMET) (Completed)   Vitamin D 25 hydroxy (Completed)   Encounter for screening mammogram for breast cancer       Relevant Orders   MM 3D SCREEN BREAST BILATERAL   Screening for lipid disorders       Relevant Orders   Lipid Profile (Completed)     Labs pending. Encouraged regular activity  and healthy diet.  Recommend routine eye exam. Return in about 6 months (around 11/28/2019) for Routine follow up.

## 2019-05-31 ENCOUNTER — Other Ambulatory Visit: Payer: Self-pay | Admitting: Nurse Practitioner

## 2019-05-31 ENCOUNTER — Encounter: Payer: Self-pay | Admitting: Nurse Practitioner

## 2019-05-31 LAB — BASIC METABOLIC PANEL
BUN/Creatinine Ratio: 15 (ref 9–23)
BUN: 15 mg/dL (ref 6–24)
CO2: 21 mmol/L (ref 20–29)
Calcium: 9.6 mg/dL (ref 8.7–10.2)
Chloride: 104 mmol/L (ref 96–106)
Creatinine, Ser: 1.02 mg/dL — ABNORMAL HIGH (ref 0.57–1.00)
GFR calc Af Amer: 70 mL/min/{1.73_m2} (ref >59)
GFR calc non Af Amer: 61 mL/min/{1.73_m2} (ref >59)
Glucose: 92 mg/dL (ref 65–99)
Potassium: 4.2 mmol/L (ref 3.5–5.2)
Sodium: 140 mmol/L (ref 134–144)

## 2019-05-31 LAB — LIPID PANEL
Chol/HDL Ratio: 2.9 ratio (ref 0.0–4.4)
Cholesterol, Total: 202 mg/dL — ABNORMAL HIGH (ref 100–199)
HDL: 70 mg/dL (ref >39)
LDL Chol Calc (NIH): 119 mg/dL — ABNORMAL HIGH (ref 0–99)
Triglycerides: 71 mg/dL (ref 0–149)
VLDL Cholesterol Cal: 13 mg/dL (ref 5–40)

## 2019-05-31 LAB — VITAMIN D 25 HYDROXY (VIT D DEFICIENCY, FRACTURES): Vit D, 25-Hydroxy: 21.2 ng/mL — ABNORMAL LOW (ref 30.0–100.0)

## 2019-05-31 MED ORDER — VITAMIN D (ERGOCALCIFEROL) 1.25 MG (50000 UNIT) PO CAPS: 50000.0000 [IU] | ORAL_CAPSULE | ORAL | 2 refills | Status: DC

## 2019-06-01 ENCOUNTER — Encounter: Payer: Self-pay | Admitting: Nurse Practitioner

## 2019-06-23 ENCOUNTER — Ambulatory Visit (HOSPITAL_COMMUNITY)
Admission: RE | Admit: 2019-06-23 | Discharge: 2019-06-23 | Disposition: A | Discharge status: Home / Self Care | Payer: PRIVATE HEALTH INSURANCE | Source: Ambulatory Visit | Attending: Nurse Practitioner | Admitting: Nurse Practitioner

## 2019-06-23 ENCOUNTER — Other Ambulatory Visit: Payer: Self-pay | Admitting: Family Medicine

## 2019-06-23 ENCOUNTER — Other Ambulatory Visit: Payer: Self-pay

## 2019-06-23 DIAGNOSIS — Z1231 Encounter for screening mammogram for malignant neoplasm of breast: Secondary | ICD-10-CM | POA: Insufficient documentation

## 2019-07-28 ENCOUNTER — Ambulatory Visit (INDEPENDENT_AMBULATORY_CARE_PROVIDER_SITE_OTHER): Payer: PRIVATE HEALTH INSURANCE | Admitting: Internal Medicine

## 2019-07-28 ENCOUNTER — Encounter: Payer: Self-pay | Admitting: Internal Medicine

## 2019-07-28 ENCOUNTER — Other Ambulatory Visit: Payer: Self-pay

## 2019-07-28 VITALS — BP 126/80 | HR 58 | Temp 98.3°F | Ht 64.0 in | Wt 184.2 lb

## 2019-07-28 DIAGNOSIS — E6609 Other obesity due to excess calories: Secondary | ICD-10-CM | POA: Diagnosis not present

## 2019-07-28 DIAGNOSIS — F5101 Primary insomnia: Secondary | ICD-10-CM

## 2019-07-28 DIAGNOSIS — N952 Postmenopausal atrophic vaginitis: Secondary | ICD-10-CM | POA: Diagnosis not present

## 2019-07-28 DIAGNOSIS — N951 Menopausal and female climacteric states: Secondary | ICD-10-CM

## 2019-07-28 DIAGNOSIS — Z6831 Body mass index (BMI) 31.0-31.9, adult: Secondary | ICD-10-CM

## 2019-07-28 MED ORDER — ESTROGENS CONJUGATED 0.3 MG PO TABS: ORAL_TABLET | ORAL | 3 refills | Status: DC

## 2019-07-28 MED ORDER — VITAMIN D (ERGOCALCIFEROL) 1.25 MG (50000 UNIT) PO CAPS: 50000.0000 [IU] | ORAL_CAPSULE | ORAL | 2 refills | Status: DC

## 2019-07-28 NOTE — Patient Instructions (Signed)
Exercising to Stay Healthy To become healthy and stay healthy, it is recommended that you do moderate-intensity and vigorous-intensity exercise. You can tell that you are exercising at a moderate intensity if your heart starts beating faster and you start breathing faster but can still hold a conversation. You can tell that you are exercising at a vigorous intensity if you are breathing much harder and faster and cannot hold a conversation while exercising. Exercising regularly is important. It has many health benefits, such as:  Improving overall fitness, flexibility, and endurance.  Increasing bone density.  Helping with weight control.  Decreasing body fat.  Increasing muscle strength.  Reducing stress and tension.  Improving overall health. How often should I exercise? Choose an activity that you enjoy, and set realistic goals. Your health care provider can help you make an activity plan that works for you. Exercise regularly as told by your health care provider. This may include:  Doing strength training two times a week, such as: ? Lifting weights. ? Using resistance bands. ? Push-ups. ? Sit-ups. ? Yoga.  Doing a certain intensity of exercise for a given amount of time. Choose from these options: ? A total of 150 minutes of moderate-intensity exercise every week. ? A total of 75 minutes of vigorous-intensity exercise every week. ? A mix of moderate-intensity and vigorous-intensity exercise every week. Children, pregnant women, people who have not exercised regularly, people who are overweight, and older adults may need to talk with a health care provider about what activities are safe to do. If you have a medical condition, be sure to talk with your health care provider before you start a new exercise program. What are some exercise ideas? Moderate-intensity exercise ideas include:  Walking 1 mile (1.6 km) in about 15  minutes.  Biking.  Hiking.  Golfing.  Dancing.  Water aerobics. Vigorous-intensity exercise ideas include:  Walking 4.5 miles (7.2 km) or more in about 1 hour.  Jogging or running 5 miles (8 km) in about 1 hour.  Biking 10 miles (16.1 km) or more in about 1 hour.  Lap swimming.  Roller-skating or in-line skating.  Cross-country skiing.  Vigorous competitive sports, such as football, basketball, and soccer.  Jumping rope.  Aerobic dancing. What are some everyday activities that can help me to get exercise?  Yard work, such as: ? Pushing a lawn mower. ? Raking and bagging leaves.  Washing your car.  Pushing a stroller.  Shoveling snow.  Gardening.  Washing windows or floors. How can I be more active in my day-to-day activities?  Use stairs instead of an elevator.  Take a walk during your lunch break.  If you drive, park your car farther away from your work or school.  If you take public transportation, get off one stop early and walk the rest of the way.  Stand up or walk around during all of your indoor phone calls.  Get up, stretch, and walk around every 30 minutes throughout the day.  Enjoy exercise with a friend. Support to continue exercising will help you keep a regular routine of activity. What guidelines can I follow while exercising?  Before you start a new exercise program, talk with your health care provider.  Do not exercise so much that you hurt yourself, feel dizzy, or get very short of breath.  Wear comfortable clothes and wear shoes with good support.  Drink plenty of water while you exercise to prevent dehydration or heat stroke.  Work out until your breathing   and your heartbeat get faster. Where to find more information  [place].S[place] Department of Health and Human Services: www.hhs.gov  Centers for Disease Control and Prevention (CDC): www.cdc.gov Summary  Exercising regularly is important. It will improve your overall fitness,  flexibility, and endurance.  Regular exercise also will improve your overall health. It can help you control your weight, reduce stress, and improve your bone density.  Do not exercise so much that you hurt yourself, feel dizzy, or get very short of breath.  Before you start a new exercise program, talk with your health care provider. This information is not intended to replace advice given to you by your health care provider. Make sure you discuss any questions you have with your health care provider. Document Revised: 05/18/2017 Document Reviewed: 04/26/2017 Elsevier Patient Education  2020 [employer]vier Inc.  

## 2019-07-28 NOTE — Progress Notes (Signed)
This visit occurred during the SARS-CoV-2 public health emergency.  Safety protocols were in place, including screening questions prior to the visit, additional usage of staff PPE, and extensive cleaning of exam room while observing appropriate contact time as indicated for disinfecting solutions.  Subjective:     Patient ID: [name] , female    DOB: 02-09-1961 , 59 y.o.   MRN: [id number]   Chief Complaint  Patient presents with  . Hormones f/u    HPI  She is here today for BHRT f/u. She recently did a saliva kit.  She unfortunately is still not sleeping well. She is not having any hot flashes.  She reports compliance with oral estrogen and bio-identical estriol/testosterone vaginally and topical progesterone.     Past Medical History:  Diagnosis Date  . Current use of estrogen therapy 02/24/2015  . Depressed 04/21/2015  . Gastroesophageal reflux disease   . Hair loss 02/24/2015  . Hot flashes 02/24/2015  . Hypertension      Family History  Problem Relation Age of Onset  . Diabetes type II Mother   . Coronary artery disease Mother   . Diabetes type II Father   . Coronary artery disease Father   . Hypertension Father   . Diabetes type II Sister   . Kidney disease Sister   . Diabetes Brother   . Thyroid disease Sister   . Hypertension Sister   . Hypertension Sister   . Hypertension Brother   . Colon cancer Neg Hx   . Colon polyps Neg Hx   . Breast cancer Neg Hx   . Ovarian cancer Neg Hx   . Uterine cancer Neg Hx   . Ulcerative colitis Neg Hx   . Celiac disease Neg Hx   . Crohn's disease Neg Hx      Current Outpatient Medications:  .  ALPRAZolam (XANAX) 0.5 MG tablet, TAKE (1/2) TO (1) TABLET TWICE DAILY AS NEEDED., Disp: 30 tablet, Rfl: 1 .  Ascorbic Acid (VITAMIN C PO), Take by mouth., Disp: , Rfl:  .  BIOTIN PO, Take by mouth daily., Disp: , Rfl:  .  escitalopram (LEXAPRO) 10 MG tablet, Take 1 tablet (10 mg total) by mouth daily., Disp: 30 tablet, Rfl: 6 .   estrogens, conjugated, (PREMARIN) 0.3 MG tablet, TAKE ONE TABLET BY MOUTH ONCE DAILY., Disp: 30 tablet, Rfl: 3 .  fish oil-omega-3 fatty acids 1000 MG capsule, Take 2 g by mouth daily., Disp: , Rfl:  .  fluocinonide ointment (LIDEX) 0.05 %, Apply to areas of hair loss on the scalp once daily 4 to 5 days of the week., Disp: , Rfl:  .  GINKGO BILOBA PO, Take by mouth., Disp: , Rfl:  .  hydrochlorothiazide (HYDRODIURIL) 25 MG tablet, TAKE (1) TABLET BY MOUTH PRN, Disp: 30 tablet, Rfl: 5 .  Multiple Vitamin (MULTIVITAMIN) capsule, Take 1 capsule by mouth daily., Disp: , Rfl:  .  NIFEdipine (PROCARDIA-XL/NIFEDICAL-XL) 30 MG 24 hr tablet, Take 1 tablet (30 mg total) by mouth daily., Disp: 30 tablet, Rfl: 5 .  omeprazole (PRILOSEC) 20 MG capsule, Take 20 mg by mouth as needed. , Disp: , Rfl:  .  potassium chloride (KLOR-CON) 10 MEQ tablet, Take one tablet by mouth prn, Disp: 30 tablet, Rfl: 5 .  PRESCRIPTION MEDICATION, EST/TEST 0.38m/ml vaginal cream 12 gram. Take one ml vaginally three times a week., Disp: , Rfl:  .  Probiotic Product (ALIGN) 4 MG CAPS, Take by mouth daily., Disp: , Rfl:  .  progesterone (PROMETRIUM) 100 MG capsule, Take 100 mg by mouth daily., Disp: , Rfl:  .  vitamin B-12 (CYANOCOBALAMIN) 1000 MCG tablet, Take 1,000 mcg by mouth daily., Disp: , Rfl:  .  Vitamin D, Ergocalciferol, (DRISDOL) 1.25 MG (50000 UNIT) CAPS capsule, Take 1 capsule (50,000 Units total) by mouth every 7 (seven) days., Disp: 12 capsule, Rfl: 2   Allergies  Allergen Reactions  . Plaquenil [Hydroxychloroquine Sulfate]     Itching hives  . Hydrocodone Itching  . Lisinopril     Headache  Dizzy   . Metronidazole Hives  . Norvasc [Amlodipine Besylate]     Patient did not have true allergy she had side effects with medication     Review of Systems  Constitutional: Negative.   Respiratory: Negative.   Cardiovascular: Negative.   Gastrointestinal: Negative.   Neurological: Negative.    Psychiatric/Behavioral: Positive for sleep disturbance.     Today's Vitals   07/28/19 1517  BP: 126/80  Pulse: (!) 58  Temp: 98.3 F (36.8 C)  TempSrc: Oral  Weight: 184 lb 3.2 oz (83.6 kg)  Height: '5\' 4"'  (1.626 m)   Body mass index is 31.62 kg/m.   Objective:  Physical Exam Vitals and nursing note reviewed.  Constitutional:      Appearance: Normal appearance.  HENT:     Head: Normocephalic and atraumatic.  Cardiovascular:     Rate and Rhythm: Normal rate and regular rhythm.     Heart sounds: Normal heart sounds.  Pulmonary:     Effort: Pulmonary effort is normal.     Breath sounds: Normal breath sounds.  Skin:    General: Skin is warm.  Neurological:     General: No focal deficit present.     Mental Status: She is alert.  Psychiatric:        Mood and Affect: Mood normal.        Behavior: Behavior normal.         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 I went over her saliva test results in full detail. They are significant for nl estrone, nl estradiol and low estriol which resulted in nl estrogen quotient. I will increase the estriol component of her vaginal cream. I will increase to 0.5/0.25 testosterone - she will use this vaginally three days per week.  She had low progesterone, which resulted in a low Pg/E2 ratio. Therefore, I will increase her dose of progesterone SR 273m daily. Lastly, I again discussed with her the risks of oral estrogen. She does not wish to stop at this time. She verbalizes understanding of increased risk of clotting associated with oral estrogen use.   2. Primary insomnia  Chronic. Pt advised that the increased dose of progesterone should help improve her sleep quality. She agrees to contact me in two week sfor re-evaluation.   3. Atrophic vaginitis  Chronic, improved with use of estriol/testosterone vaginal cream.   4. Class 1 obesity due to excess calories with serious comorbidity and body mass index (BMI)  of 31.0 to 31.9 in adult  She is encouraged to strive for BMI less than 27 to decrease cardiac risk. She is advised to aim for at least 30 minutes of exercise five days per week.     RMaximino Greenland MD    THE PATIENT IS ENCOURAGED TO PRACTICE SOCIAL DISTANCING DUE TO THE COVID-19 PANDEMIC.

## 2019-07-29 ENCOUNTER — Encounter: Payer: Self-pay | Admitting: Internal Medicine

## 2019-08-07 ENCOUNTER — Encounter: Payer: Self-pay | Admitting: Nurse Practitioner

## 2019-08-20 ENCOUNTER — Encounter: Payer: Self-pay | Admitting: Internal Medicine

## 2019-08-30 ENCOUNTER — Ambulatory Visit: Payer: PRIVATE HEALTH INSURANCE | Attending: Internal Medicine

## 2019-08-30 DIAGNOSIS — Z23 Encounter for immunization: Secondary | ICD-10-CM

## 2019-08-30 NOTE — Progress Notes (Signed)
   Covid-19 Vaccination Clinic  Name:  [name]    MRN: [id number] DOB: 02-Oct-1960  08/30/2019  [name] was observed post Covid-19 immunization for 15 minutes without incident. She was provided with Vaccine Information Sheet and instruction to access the V-Safe system.   [name] was instructed to call 911 with any severe reactions post vaccine: Marland Kitchen Difficulty breathing  . Swelling of face and throat  . A fast heartbeat  . A bad rash all over body  . Dizziness and weakness   Immunizations Administered    Name Date Dose VIS Date Route   Moderna COVID-19 Vaccine 08/30/2019 11:07 AM 0.5 mL 05/20/2019 Intramuscular   Manufacturer: Moderna   Lot: [id number]   [id number]

## 2019-10-01 ENCOUNTER — Ambulatory Visit: Payer: PRIVATE HEALTH INSURANCE | Attending: Internal Medicine

## 2019-10-01 DIAGNOSIS — Z23 Encounter for immunization: Secondary | ICD-10-CM

## 2019-10-01 NOTE — Progress Notes (Signed)
   Covid-19 Vaccination Clinic  Name:  [name]    MRN: [id number] DOB: 1961-03-18  10/01/2019  [name] was observed post Covid-19 immunization for 15 minutes without incident. She was provided with Vaccine Information Sheet and instruction to access the V-Safe system.   [name] was instructed to call 911 with any severe reactions post vaccine: Marland Kitchen Difficulty breathing  . Swelling of face and throat  . A fast heartbeat  . A bad rash all over body  . Dizziness and weakness   Immunizations Administered    Name Date Dose VIS Date Route   Moderna COVID-19 Vaccine 10/01/2019 10:44 AM 0.5 mL 05/20/2019 Intramuscular   Manufacturer: Moderna   Lot: [id number]   [id number]

## 2019-10-27 ENCOUNTER — Other Ambulatory Visit: Payer: Self-pay

## 2019-10-27 ENCOUNTER — Ambulatory Visit (INDEPENDENT_AMBULATORY_CARE_PROVIDER_SITE_OTHER): Payer: PRIVATE HEALTH INSURANCE | Admitting: Internal Medicine

## 2019-10-27 ENCOUNTER — Encounter: Payer: Self-pay | Admitting: Internal Medicine

## 2019-10-27 VITALS — BP 116/72 | HR 60 | Temp 98.2°F | Ht 64.0 in | Wt 180.4 lb

## 2019-10-27 DIAGNOSIS — I10 Essential (primary) hypertension: Secondary | ICD-10-CM

## 2019-10-27 DIAGNOSIS — R0683 Snoring: Secondary | ICD-10-CM | POA: Diagnosis not present

## 2019-10-27 DIAGNOSIS — N951 Menopausal and female climacteric states: Secondary | ICD-10-CM

## 2019-10-27 DIAGNOSIS — R5383 Other fatigue: Secondary | ICD-10-CM

## 2019-10-27 DIAGNOSIS — Z6830 Body mass index (BMI) 30.0-30.9, adult: Secondary | ICD-10-CM

## 2019-10-27 DIAGNOSIS — Z79899 Other long term (current) drug therapy: Secondary | ICD-10-CM

## 2019-10-27 DIAGNOSIS — E6609 Other obesity due to excess calories: Secondary | ICD-10-CM

## 2019-10-27 NOTE — Progress Notes (Signed)
This visit occurred during the SARS-CoV-2 public health emergency.  Safety protocols were in place, including screening questions prior to the visit, additional usage of staff PPE, and extensive cleaning of exam room while observing appropriate contact time as indicated for disinfecting solutions.  Subjective:     Patient ID: [name] , female    DOB: 09-24-1960 , 59 y.o.   MRN: [id number]   Chief Complaint  Patient presents with  . Hormones f/u  . Fatigue    HPI  She is here today for f/u BHRT.  She reports compliance with progesterone nightly. She is still having issues with sleep. She is fatigued during the day. Her sx are not improving.     Past Medical History:  Diagnosis Date  . Current use of estrogen therapy 02/24/2015  . Depressed 04/21/2015  . Gastroesophageal reflux disease   . Hair loss 02/24/2015  . Hot flashes 02/24/2015  . Hypertension      Family History  Problem Relation Age of Onset  . Diabetes type II Mother   . Coronary artery disease Mother   . Diabetes type II Father   . Coronary artery disease Father   . Hypertension Father   . Diabetes type II Sister   . Kidney disease Sister   . Diabetes Brother   . Thyroid disease Sister   . Hypertension Sister   . Hypertension Sister   . Hypertension Brother   . Colon cancer Neg Hx   . Colon polyps Neg Hx   . Breast cancer Neg Hx   . Ovarian cancer Neg Hx   . Uterine cancer Neg Hx   . Ulcerative colitis Neg Hx   . Celiac disease Neg Hx   . Crohn's disease Neg Hx      Current Outpatient Medications:  .  ALPRAZolam (XANAX) 0.5 MG tablet, TAKE (1/2) TO (1) TABLET TWICE DAILY AS NEEDED., Disp: 30 tablet, Rfl: 1 .  Ascorbic Acid (VITAMIN C PO), Take by mouth., Disp: , Rfl:  .  BIOTIN PO, Take by mouth daily., Disp: , Rfl:  .  escitalopram (LEXAPRO) 10 MG tablet, Take 1 tablet (10 mg total) by mouth daily., Disp: 30 tablet, Rfl: 6 .  estrogens, conjugated, (PREMARIN) 0.3 MG tablet, TAKE ONE TABLET BY  MOUTH ONCE DAILY., Disp: 30 tablet, Rfl: 3 .  GINKGO BILOBA PO, Take by mouth., Disp: , Rfl:  .  hydrochlorothiazide (HYDRODIURIL) 25 MG tablet, TAKE (1) TABLET BY MOUTH PRN, Disp: 30 tablet, Rfl: 5 .  Multiple Vitamin (MULTIVITAMIN) capsule, Take 1 capsule by mouth daily., Disp: , Rfl:  .  NIFEdipine (PROCARDIA-XL/NIFEDICAL-XL) 30 MG 24 hr tablet, Take 1 tablet (30 mg total) by mouth daily., Disp: 30 tablet, Rfl: 5 .  NON FORMULARY, Estriol / testosterone 0.25, apply 1 ML vaginally 3 times per week, Disp: , Rfl:  .  potassium chloride (KLOR-CON) 10 MEQ tablet, Take one tablet by mouth prn, Disp: 30 tablet, Rfl: 5 .  Probiotic Product (ALIGN) 4 MG CAPS, Take by mouth daily., Disp: , Rfl:  .  progesterone (PROMETRIUM) 100 MG capsule, Take 200 mg by mouth. Daily except sunday, Disp: , Rfl:  .  vitamin B-12 (CYANOCOBALAMIN) 1000 MCG tablet, Take 1,000 mcg by mouth daily., Disp: , Rfl:  .  Vitamin D, Ergocalciferol, (DRISDOL) 1.25 MG (50000 UNIT) CAPS capsule, Take 1 capsule (50,000 Units total) by mouth every 7 (seven) days., Disp: 12 capsule, Rfl: 2 .  fish oil-omega-3 fatty acids 1000 MG capsule, Take 2  g by mouth daily., Disp: , Rfl:  .  fluocinonide ointment (LIDEX) 0.05 %, Apply to areas of hair loss on the scalp once daily 4 to 5 days of the week., Disp: , Rfl:    Allergies  Allergen Reactions  . Plaquenil [Hydroxychloroquine Sulfate]     Itching hives  . Hydrocodone Itching  . Lisinopril     Headache  Dizzy   . Metronidazole Hives  . Norvasc [Amlodipine Besylate]     Patient did not have true allergy she had side effects with medication     Review of Systems  Constitutional: Positive for fatigue.       She does admit to snoring. Doesn't feel rested in am. Stays very tired during the day.   Respiratory: Negative.   Cardiovascular: Negative.   Gastrointestinal: Negative.   Neurological: Negative.   Psychiatric/Behavioral: Negative.      Today's Vitals   10/27/19 1109  BP:  116/72  Pulse: 60  Temp: 98.2 F (36.8 C)  TempSrc: Oral  Weight: 180 lb 6.4 oz (81.8 kg)  Height: '5\' 4"'  (1.626 m)   Body mass index is 30.97 kg/m.   Objective:  Physical Exam Vitals and nursing note reviewed.  Constitutional:      Appearance: Normal appearance. She is obese.  HENT:     Head: Normocephalic and atraumatic.  Cardiovascular:     Rate and Rhythm: Normal rate and regular rhythm.     Heart sounds: Normal heart sounds.  Pulmonary:     Effort: Pulmonary effort is normal.     Breath sounds: Normal breath sounds.  Skin:    General: Skin is warm.  Neurological:     General: No focal deficit present.     Mental Status: She is alert.  Psychiatric:        Mood and Affect: Mood normal.        Behavior: Behavior normal.         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 I will call in refills of Progesterone SR into [place]. She will rto in four months for BHRT f/u.   2. Fatigue, unspecified type  Her sx are worsening and suggestive of OSA. She was advised of risks associated with untreated OSA. She agrees to Neuro eval for sleep study evaluation. I will check labs as listed below.   - Ambulatory referral to Neurology - CMP14+EGFR - TSH  3. Essential hypertension, benign  Chronic, well controlled. She will continue with current meds as per her PCP. She is encouraged to avoid adding salt to her foods.   4. Snoring  She also admits to daytime somnolence, non-restorative sleep and nocturia.  I will refer her to Neuro for sleep study evaluation. Risks of untreated OSA were discussed with the patient.   - Ambulatory referral to Neurology  5. Class 1 obesity due to excess calories with serious comorbidity and body mass index (BMI) of 30.0 to 30.9 in adult  She is encouraged to strive for BMI less than 27 to decrease cardiac risk. Advised to incorporate at least 30 minutes five days per week.    [name], MD    THE PATIENT IS ENCOURAGED TO PRACTICE SOCIAL DISTANCING DUE TO THE COVID-19 PANDEMIC.

## 2019-10-28 LAB — CBC
Hematocrit: 39.2 % (ref 34.0–46.6)
Hemoglobin: 12.8 g/dL (ref 11.1–15.9)
MCH: 28.2 pg (ref 26.6–33.0)
MCHC: 32.7 g/dL (ref 31.5–35.7)
MCV: 86 fL (ref 79–97)
Platelets: 241 10*3/uL (ref 150–450)
RBC: 4.54 x10E6/uL (ref 3.77–5.28)
RDW: 14.4 % (ref 11.7–15.4)
WBC: 6.8 10*3/uL (ref 3.4–10.8)

## 2019-10-28 LAB — CMP14+EGFR
ALT: 13 IU/L (ref 0–32)
AST: 16 IU/L (ref 0–40)
Albumin/Globulin Ratio: 1.8 (ref 1.2–2.2)
Albumin: 4.4 g/dL (ref 3.8–4.9)
Alkaline Phosphatase: 109 IU/L (ref 39–117)
BUN/Creatinine Ratio: 19 (ref 9–23)
BUN: 18 mg/dL (ref 6–24)
Bilirubin Total: 0.4 mg/dL (ref 0.0–1.2)
CO2: 23 mmol/L (ref 20–29)
Calcium: 9.6 mg/dL (ref 8.7–10.2)
Chloride: 105 mmol/L (ref 96–106)
Creatinine, Ser: 0.96 mg/dL (ref 0.57–1.00)
GFR calc Af Amer: 75 mL/min/{1.73_m2} (ref >59)
GFR calc non Af Amer: 65 mL/min/{1.73_m2} (ref >59)
Globulin, Total: 2.5 g/dL (ref 1.5–4.5)
Glucose: 108 mg/dL — ABNORMAL HIGH (ref 65–99)
Potassium: 4.5 mmol/L (ref 3.5–5.2)
Sodium: 142 mmol/L (ref 134–144)
Total Protein: 6.9 g/dL (ref 6.0–8.5)

## 2019-10-28 LAB — VITAMIN B12: Vitamin B-12: 531 pg/mL (ref 232–1245)

## 2019-10-28 LAB — TSH: TSH: 0.878 u[IU]/mL (ref 0.450–4.500)

## 2019-10-30 ENCOUNTER — Encounter: Payer: Self-pay | Admitting: Internal Medicine

## 2019-11-08 NOTE — Patient Instructions (Signed)

## 2019-11-10 ENCOUNTER — Ambulatory Visit (INDEPENDENT_AMBULATORY_CARE_PROVIDER_SITE_OTHER): Payer: PRIVATE HEALTH INSURANCE | Admitting: Neurology

## 2019-11-10 ENCOUNTER — Encounter: Payer: Self-pay | Admitting: Neurology

## 2019-11-10 ENCOUNTER — Other Ambulatory Visit: Payer: Self-pay

## 2019-11-10 VITALS — BP 132/81 | HR 63 | Ht 66.0 in | Wt 178.0 lb

## 2019-11-10 DIAGNOSIS — M62838 Other muscle spasm: Secondary | ICD-10-CM

## 2019-11-10 DIAGNOSIS — F5104 Psychophysiologic insomnia: Secondary | ICD-10-CM | POA: Insufficient documentation

## 2019-11-10 DIAGNOSIS — L661 Lichen planopilaris: Secondary | ICD-10-CM

## 2019-11-10 DIAGNOSIS — R5382 Chronic fatigue, unspecified: Secondary | ICD-10-CM

## 2019-11-10 DIAGNOSIS — I10 Essential (primary) hypertension: Secondary | ICD-10-CM

## 2019-11-10 DIAGNOSIS — R0683 Snoring: Secondary | ICD-10-CM | POA: Insufficient documentation

## 2019-11-10 DIAGNOSIS — N951 Menopausal and female climacteric states: Secondary | ICD-10-CM | POA: Diagnosis not present

## 2019-11-10 DIAGNOSIS — R519 Headache, unspecified: Secondary | ICD-10-CM

## 2019-11-10 NOTE — Progress Notes (Signed)
  SLEEP MEDICINE CLINIC    Provider:  Carmen  Dohmeier, MD  Primary Care Physician:  Luking, Scott A, MD [address] [place] [address]     Referring Provider: Luking, Scott A, Md [address] [place],  [place] [address]          Chief Complaint according to patient   Patient presents with:    . New Patient (Initial Visit)     pt alone, rm 11. presents today due to struggle with her sleep. states that she averages 4 hours of sleep, but it is broken. she has a hard time falling asleep and wakes up frequently. she has never had a SS. has been told she snores in sleep. has complaints of memory fog and waking up with headaches and dizzy like spells      HISTORY OF PRESENT ILLNESS:  [name] is a 58  Year-old African American female patient and seen upon  a referral on 11/10/2019 from Dr. Sanders, MD .  Chief concern according to patient : "snoring, fatigue, loss of energy, not in pain but sore"- "I have slept well only those nights I had taken xanax'.    I have the pleasure of seeing [name] today, a right-handed African-American female with a possible sleep disorder.  She   has a past medical history of Current and longstanding use of estrogen therapy (02/24/2015), Depressed (04/21/2015), Gastroesophageal reflux disease, Hair loss (02/24/2015), Hot flashes (02/24/2015), and Hypertension. She was seen by Dr Penumalli for left sided carpal tunnel .    Sleep relevant medical history:  No ENT surgeries such as Tonsillectomy, but history of cervical tension pain,  Family medical /sleep history: no other family member with OSA, insomnia, sleep walkers.    Social history:  Patient is working as site manager for an apartment complex and lives in a household with spouse . Family status - 2 adult children, 4 grandchildren.  The patient currently works 8- 4 PM , Tue- Friday. Pets are present, dog . Tobacco use- never .   ETOH use; none ,  Caffeine intake in  form of Coffee( 2  Cups in AM ) Soda( 2 a week ) Tea ( daytime 1-2 glasses ) or energy drinks. Regular exercise in form of walking .      Sleep habits are as follows: The patient's dinner time is between 6-7 PM. The patient goes to bed at 10-11 PM and she struggles to fall asleep- sometimes 2-3 hours - her mind is racing. Once asleep , she continues to sleep for 2-3 hours, no wakes for  bathroom breaks, she just wakes up and moves to other bedroom- both rooms are cool, quiet and dark.  The preferred sleep position is on her side, with the support of 2 pillows.  Dreams are reportedly infrequent. She sleeps 4-5 hours many nights.   7 AM is the usual rise time. The patient wakes up before her alarm by 6.30 .  She reports not feeling refreshed or restored in AM, with symptoms such as dry mouth, every morning with headaches and residual fatigue.  Naps are taken infrequently, on Sundays- 2-3 hours.     Review of Systems: Out of a complete 14 system review, the patient complains of only the following symptoms, and all other reviewed systems are negative.:  Fatigue, sleepiness , snoring, fragmented sleep, Insomnia - chronic , started only after a medication change in late January 2021- and that's when   insomnia became more prominent. "BHRT f/u. She recently did a saliva kit.  She unfortunately is still not sleeping well. She is not having any hot flashes.  She reports compliance with oral estrogen and bio-identical estriol/testosterone vaginally and topical progesterone ."  Patient reports taking a progesterone pill.     How likely are you to doze in the following situations: 0 = not likely, 1 = slight chance, 2 = moderate chance, 3 = high chance   Sitting and Reading? Watching Television? Sitting inactive in a public place (theater or meeting)? As a passenger in a car for an hour without a break? Lying down in the afternoon when circumstances permit? Sitting and talking to someone? Sitting  quietly after lunch without alcohol? In a car, while stopped for a few minutes in traffic?   Total = 8/ 24 points   FSS endorsed at 54/ 63 points.   Social History   Socioeconomic History  . Marital status: Married    Spouse name: Not on file  . Number of children: Not on file  . Years of education: Not on file  . Highest education level: Not on file  Occupational History  . Not on file  Tobacco Use  . Smoking status: Never Smoker  . Smokeless tobacco: Never Used  Substance and Sexual Activity  . Alcohol use: No  . Drug use: No  . Sexual activity: Yes    Birth control/protection: Surgical    Comment: hyst  Other Topics Concern  . Not on file  Social History Narrative  . Not on file   Social Determinants of Health   Financial Resource Strain:   . Difficulty of Paying Living Expenses:   Food Insecurity:   . Worried About Running Out of Food in the Last Year:   . Ran Out of Food in the Last Year:   Transportation Needs:   . Lack of Transportation (Medical):   . Lack of Transportation (Non-Medical):   Physical Activity:   . Days of Exercise per Week:   . Minutes of Exercise per Session:   Stress:   . Feeling of Stress :   Social Connections:   . Frequency of Communication with Friends and Family:   . Frequency of Social Gatherings with Friends and Family:   . Attends Religious Services:   . Active Member of Clubs or Organizations:   . Attends Club or Organization Meetings:   . Marital Status:     Family History  Problem Relation Age of Onset  . Diabetes type II Mother   . Coronary artery disease Mother   . Diabetes type II Father   . Coronary artery disease Father   . Hypertension Father   . Diabetes type II Sister   . Kidney disease Sister   . Diabetes Brother   . Thyroid disease Sister   . Hypertension Sister   . Hypertension Sister   . Hypertension Brother   . Colon cancer Neg Hx   . Colon polyps Neg Hx   . Breast cancer Neg Hx   . Ovarian cancer  Neg Hx   . Uterine cancer Neg Hx   . Ulcerative colitis Neg Hx   . Celiac disease Neg Hx   . Crohn's disease Neg Hx     Past Medical History:  Diagnosis Date  . Current use of estrogen therapy 02/24/2015  . Depressed 04/21/2015  . Gastroesophageal reflux disease   . Hair loss 02/24/2015  . Hot flashes 02/24/2015  . Hypertension       Past Surgical History:  Procedure Laterality Date  . CHOLECYSTECTOMY    . COLONOSCOPY  07/04/2011   sessile polyp in the mid transverse colon/moderate diverticulosis in the sigmoid to descending colon segments/internal hemorrhoids  . ESOPHAGOGASTRODUODENOSCOPY    . retrograde ureteral stone extraction    . Total abdominal hysterectomy with probable salpingo-oophorectomy  2006-8   HEAVY MENSES/? CANCER  . TUBAL LIGATION       Current Outpatient Medications on File Prior to Visit  Medication Sig Dispense Refill  . ALPRAZolam (XANAX) 0.5 MG tablet TAKE (1/2) TO (1) TABLET TWICE DAILY AS NEEDED. 30 tablet 1  . Ascorbic Acid (VITAMIN C PO) Take by mouth.    Marland Kitchen BIOTIN PO Take by mouth daily.    Marland Kitchen escitalopram (LEXAPRO) 10 MG tablet Take 1 tablet (10 mg total) by mouth daily. 30 tablet 6  . estrogens, conjugated, (PREMARIN) 0.3 MG tablet TAKE ONE TABLET BY MOUTH ONCE DAILY. 30 tablet 3  . GINKGO BILOBA PO Take by mouth.    . hydrochlorothiazide (HYDRODIURIL) 25 MG tablet TAKE (1) TABLET BY MOUTH PRN 30 tablet 5  . Multiple Vitamin (MULTIVITAMIN) capsule Take 1 capsule by mouth daily.    Marland Kitchen NIFEdipine (PROCARDIA-XL/NIFEDICAL-XL) 30 MG 24 hr tablet Take 1 tablet (30 mg total) by mouth daily. 30 tablet 5  . NON FORMULARY Estriol / testosterone 0.25, apply 1 ML vaginally 3 times per week    . potassium chloride (KLOR-CON) 10 MEQ tablet Take one tablet by mouth prn 30 tablet 5  . Probiotic Product (ALIGN) 4 MG CAPS Take by mouth daily.    . progesterone (PROMETRIUM) 100 MG capsule Take 200 mg by mouth. Daily except sunday    . vitamin B-12 (CYANOCOBALAMIN) 1000 MCG  tablet Take 1,000 mcg by mouth daily.    . Vitamin D, Ergocalciferol, (DRISDOL) 1.25 MG (50000 UNIT) CAPS capsule Take 1 capsule (50,000 Units total) by mouth every 7 (seven) days. 12 capsule 2   No current facility-administered medications on file prior to visit.    Allergies  Allergen Reactions  . Plaquenil [Hydroxychloroquine Sulfate]     Itching hives  . Hydrocodone Itching  . Lisinopril     Headache  Dizzy   . Metronidazole Hives  . Norvasc [Amlodipine Besylate]     Patient did not have true allergy she had side effects with medication    patient:  Yes (MyChart) Next appt:  11/21/2019 at 11:00 AM in Family Medicine (Nilda Simmer, NP) Dx:  Fatigue, unspecified type Order: [id number]  Ref Range & Units 2 wk ago 5 mo ago 7 mo ago  Glucose 65 - 99 mg/dL 108High   92  90   BUN 6 - 24 mg/dL _0 Creatinine, Ser 0.57 - 1.00 mg/dL 0.96  1.02High   1.27High    GFR calc non Af Amer >59 mL/min/1.73 65  61  47Low    GFR calc Af Amer >59 mL/min/1.73 75  70  54Low    Comment: **[facility] currently reports eGFR in compliance with the current**   recommendations of the [employer]. [facility] will   update reporting as new guidelines are published from the NKF-ASN   Task force.   BUN/Creatinine Ratio 9 - _1 Sodium 134 - 144 mmol/L 142  140  142   Potassium 3.5 - 5.2 mmol/L 4.5  4.2  4.0   Chloride 96 - 106 mmol/L 105  104  106  CO2 20 - 29 mmol/L 23  21  23   Calcium 8.7 - 10.2 mg/dL 9.6  9.6  9.5   Total Protein 6.0 - 8.5 g/dL 6.9   7.0   Albumin 3.8 - 4.9 g/dL 4.4   4.5   Globulin, Total 1.5 - 4.5 g/dL 2.5   2.5   Albumin/Globulin Ratio 1.2 - 2.2 1.8   1.8   Bilirubin Total 0.0 - 1.2 mg/dL 0.4   0.3   Alkaline Phosphatase 39 - 117 IU/L 109   109   AST 0 - 40 IU/L 16   16   ALT 0 - 32 IU/L 13   12   Resulting Agency  [facility]    Narrative Performed by: [facility] Performed at: 01 - [facility]  [address],  [place], [place] [id number]  Lab Director: Sanjai Nagendra MD, Phone: [phone]    Specimen Collected: 10/27/19 11:49 Last Resulted: 10/28/19 03:35      Lab Flowsheet    Order Details    View Encounter    Lab and Collection Details    Routing    Result History       Result Communications   Result Notes and Comments to Patient Comment seen by patient [name] on 10/30/2019 8:49 PM EDT   Here are your lab results:  Your liver and kidney function are normal. Your thyroid function is normal.  ...  Written by Sanders, Robyn, MD on 10/30/2019 7:16 PM EDT View Full Comments        TSH  Status:  Final result Visible to patient:  Yes (MyChart) Next appt:  11/21/2019 at 11:00 AM in Family Medicine (Carolyn C Hoskins, NP) Dx:  Fatigue, unspecified type Order: [id number]  Ref Range & Units 2 wk ago 1 yr ago 2 yr ago  TSH 0.450 - 4.500 uIU/mL 0.878  1.170  0.583   Resulting Agency  [facility]    Narrative Performed by: [facility] Performed at: 01 - [facility]  [address], [place], [place] [id number]         Physical exam:  Today's Vitals   11/10/19 1140  BP: 132/81  Pulse: 63  Weight: 178 lb (80.7 kg)  Height: 5' 6" (1.676 m)   Body mass index is 28.73 kg/m.   Wt Readings from Last 3 Encounters:  11/10/19 178 lb (80.7 kg)  10/27/19 180 lb 6.4 oz (81.8 kg)  07/28/19 184 lb 3.2 oz (83.6 kg)     Ht Readings from Last 3 Encounters:  11/10/19 5' 6" (1.676 m)  10/27/19 5' 4" (1.626 m)  07/28/19 5' 4" (1.626 m)      General: The patient is awake, alert and appears not in acute distress. The patient is well groomed. Head: Normocephalic, atraumatic. Neck is supple. Mallampati 2-3 ,  neck circumference: 13  inches . Nasal airflow  patent.  Retrognathia is seen.  Dental status:  Wore retainers. Crowded dentition.  Cardiovascular:  Regular rate and cardiac rhythm by pulse,  without distended neck veins. Respiratory: Lungs are  clear to auscultation.  Skin:  there is evidence of ankle edema, no rash. Trunk: The patient's posture is erect.   Neurologic exam : The patient is awake and alert, oriented to place and time.   Memory subjective described as intact.  Attention span & concentration ability appears normal.  Speech is fluent,  without  dysarthria, dysphonia or aphasia.  Mood and affect are appropriate.   Cranial nerves: no loss   of smell or taste reported  Pupils are equal and briskly reactive to light. Funduscopic exam deferred.  Extraocular movements in vertical and horizontal planes were intact and without nystagmus. No Diplopia. Visual fields by finger perimetry are intact. Hearing was intact to soft voice and finger rubbing.    Facial sensation intact to fine touch.  Facial motor strength is symmetric and tongue and uvula move midline.  Neck ROM : rotation, tilt and flexion extension were normal for age and shoulder shrug was symmetrical.    Motor exam:  Symmetric bulk, tone and ROM.   Normal tone without cog wheeling, symmetrical but unextpectently weak grip strength . There is a small thenar eminence on the right-    Sensory:  Fine touch, pinprick and vibration were present at both ankles.  Proprioception tested in the upper extremities was normal.   Coordination: Rapid alternating movements in the fingers/hands were of normal speed.   The Finger-to-nose maneuver was intact without evidence of ataxia, dysmetria or tremor.   Gait and station: Patient could rise unassisted from a seated position, walked without assistive device.  Stance is of normal width/ base .Toe and heel walk were deferred.  Deep tendon reflexes: in the upper and lower extremities are symmetric and intact.  Babinski response was deferred.       After spending a total time of  45 minutes face to face and additional time for physical and neurologic examination, review of laboratory studies,  personal review of imaging  studies, reports and results of other testing and review of referral information / records as far as provided in visit, I have established the following assessments:  1) [name] describes that there may have been some insomnia for much longer than January 2021 is an onset time but she has noted it got worse and that.  Dr. Saunders had increased the progesterone and hope that this would help her sleep but it has not.  She has noted that her work environment is more stressful and she actually works full-time also her work has been originally marketed as a part-time position.  She has a lot of apartment units to follow and the job may also affect her sleep somewhat.  The patient has a BMI of 28.7 I would not be worried about her BMI her upper airway is not narrowed, her neck circumference is small benefit that many anatomical reasons to be worried about possible sleep apnea except for retrognathia a smaller more crowded lower jaw.  There is no neurologic deficit except for the weakness of the right hand and grip strength and the pain but she describes it radiates to the ulna.  However that hand discomfort is not affecting her sleep.  She does not report hot flashes at night now.   She is taking vitamin D she has been on vitamin B12 supplement she takes probiotics potassium multivitamins and Procardia.  Hydrochlorothiazide by mouth in the morning it has not caused her to have nocturia.  She has been on Lexapro for a while 10 mg low-dose she also takes biotin for skin and nails and has been prescribed Xanax alprazolam by Dr. Luking up to twice a day.  She is rightfully concerned that this may lead to an addiction.  I think we have other ways of employing nonaddictive sleep aids that I would prefer.  I also will definitely invite her for a sleep study or home sleep test to demonstrate to her insurance at least that we have moved out   in apnea causing insomnia.    My Plan is to proceed with:  1) either attended  sleep study or HST- I prefer to have an attended sleep study to see heart rhythm,  PLMs and other contributions to non- restorative sleep.   2) I suspect hormone changes are not the sole cause of her insomnia.   3) she snores   4. 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  I would like to thank Kathyrn Drown, MD and Kathyrn Drown, [place],  [place] [address] for allowing me to meet with and to take care of this pleasant patient.   In short, [name] is presenting with insomnia, slowly worsening, snoring, fatigue and non restorative sleep   I plan to follow up either personally or through our NP within 3 month.   CC: I will share my notes with PCP and Dr. Baird Cancer. .  Electronically signed by: Larey Seat, MD 11/10/2019 11:57 AM  [facility] Board certified by The [employer] of Sleep Medicine and Diplomate of the [employer] of Sleep Medicine. Board certified In Neurology through the [employer], Fellow of the [employer] of Neurology. Medical Director of [facility].

## 2019-11-10 NOTE — Patient Instructions (Signed)
Sleep Apnea Sleep apnea affects breathing during sleep. It causes breathing to stop for a short time or to become shallow. It can also increase the risk of:  Heart attack.  Stroke.  Being very overweight (obese).  Diabetes.  Heart failure.  Irregular heartbeat. The goal of treatment is to help you breathe normally again. What are the causes? There are three kinds of sleep apnea:  Obstructive sleep apnea. This is caused by a blocked or collapsed airway.  Central sleep apnea. This happens when the brain does not send the right signals to the muscles that control breathing.  Mixed sleep apnea. This is a combination of obstructive and central sleep apnea. The most common cause of this condition is a collapsed or blocked airway. This can happen if:  Your throat muscles are too relaxed.  Your tongue and tonsils are too large.  You are overweight.  Your airway is too small. What increases the risk?  Being overweight.  Smoking.  Having a small airway.  Being older.  Being female.  Drinking alcohol.  Taking medicines to calm yourself (sedatives or tranquilizers).  Having family members with the condition. What are the signs or symptoms?  Trouble staying asleep.  Being sleepy or tired during the day.  Getting angry a lot.  Loud snoring.  Headaches in the morning.  Not being able to focus your mind (concentrate).  Forgetting things.  Less interest in sex.  Mood swings.  Personality changes.  Feelings of sadness (depression).  Waking up a lot during the night to pee (urinate).  Dry mouth.  Sore throat. How is this diagnosed?  Your medical history.  A physical exam.  A test that is done when you are sleeping (sleep study). The test is most often done in a sleep lab but may also be done at home. How is this treated?   Sleeping on your side.  Using a medicine to get rid of mucus in your nose (decongestant).  Avoiding the use of alcohol,  medicines to help you relax, or certain pain medicines (narcotics).  Losing weight, if needed.  Changing your diet.  Not smoking.  Using a machine to open your airway while you sleep, such as: ? An oral appliance. This is a mouthpiece that shifts your lower jaw forward. ? A CPAP device. This device blows air through a mask when you breathe out (exhale). ? An EPAP device. This has valves that you put in each nostril. ? A BPAP device. This device blows air through a mask when you breathe in (inhale) and breathe out.  Having surgery if other treatments do not work. It is important to get treatment for sleep apnea. Without treatment, it can lead to:  High blood pressure.  Coronary artery disease.  In men, not being able to have an erection (impotence).  Reduced thinking ability. Follow these instructions at home: Lifestyle  Make changes that your doctor recommends.  Eat a healthy diet.  Lose weight if needed.  Avoid alcohol, medicines to help you relax, and some pain medicines.  Do not use any products that contain nicotine or tobacco, such as cigarettes, e-cigarettes, and chewing tobacco. If you need help quitting, ask your doctor. General instructions  Take over-the-counter and prescription medicines only as told by your doctor.  If you were given a machine to use while you sleep, use it only as told by your doctor.  If you are having surgery, make sure to tell your doctor you have sleep apnea. You   may need to bring your device with you.  Keep all follow-up visits as told by your doctor. This is important. Contact a doctor if:  The machine that you were given to use during sleep bothers you or does not seem to be working.  You do not get better.  You get worse. Get help right away if:  Your chest hurts.  You have trouble breathing in enough air.  You have an uncomfortable feeling in your back, arms, or stomach.  You have trouble talking.  One side of your  body feels weak.  A part of your face is hanging down. These symptoms may be an emergency. Do not wait to see if the symptoms will go away. Get medical help right away. Call your local emergency services (911 in the [place] Do not drive yourself to the hospital. Summary  This condition affects breathing during sleep.  The most common cause is a collapsed or blocked airway.  The goal of treatment is to help you breathe normally while you sleep. This information is not intended to replace advice given to you by your health care provider. Make sure you discuss any questions you have with your health care provider. Document Revised: 03/22/2018 Document Reviewed: 01/29/2018 Elsevier Patient Education  2020 Elsevier Inc. Quality Sleep Information, Adult Quality sleep is important for your mental and physical health. It also improves your quality of life. Quality sleep means you:  Are asleep for most of the time you are in bed.  Fall asleep within 30 minutes.  Wake up no more than once a night.  Are awake for no longer than 20 minutes if you do wake up during the night. Most adults need 7-8 hours of quality sleep each night. How can poor sleep affect me? If you do not get enough quality sleep, you may have:  Mood swings.  Daytime sleepiness.  Confusion.  Decreased reaction time.  Sleep disorders, such as insomnia and sleep apnea.  Difficulty with: ? Solving problems. ? Coping with stress. ? Paying attention. These issues may affect your performance and productivity at work, school, and at home. Lack of sleep may also put you at higher risk for accidents, suicide, and risky behaviors. If you do not get quality sleep you may also be at higher risk for several health problems, including:  Infections.  Type 2 diabetes.  Heart disease.  High blood pressure.  Obesity.  Worsening of long-term conditions, like arthritis, kidney disease, depression, Parkinson's disease, and  epilepsy. What actions can I take to get more quality sleep?      Stick to a sleep schedule. Go to sleep and wake up at about the same time each day. Do not try to sleep less on weekdays and make up for lost sleep on weekends. This does not work.  Try to get about 30 minutes of exercise on most days. Do not exercise 2-3 hours before going to bed.  Limit naps during the day to 30 minutes or less.  Do not use any products that contain nicotine or tobacco, such as cigarettes or e-cigarettes. If you need help quitting, ask your health care provider.  Do not drink caffeinated beverages for at least 8 hours before going to bed. Coffee, tea, and some sodas contain caffeine.  Do not drink alcohol close to bedtime.  Do not eat large meals close to bedtime.  Do not take naps in the late afternoon.  Try to get at least 30 minutes of sunlight every day.   Morning sunlight is best.  Make time to relax before bed. Reading, listening to music, or taking a hot bath promotes quality sleep.  Make your bedroom a place that promotes quality sleep. Keep your bedroom dark, quiet, and at a comfortable room temperature. Make sure your bed is comfortable. Take out sleep distractions like TV, a computer, smartphone, and bright lights.  If you are lying awake in bed for longer than 20 minutes, get up and do a relaxing activity until you feel sleepy.  Work with your health care provider to treat medical conditions that may affect sleeping, such as: ? Nasal obstruction. ? Snoring. ? Sleep apnea and other sleep disorders.  Talk to your health care provider if you think any of your prescription medicines may cause you to have difficulty falling or staying asleep.  If you have sleep problems, talk with a sleep consultant. If you think you have a sleep disorder, talk with your health care provider about getting evaluated by a specialist. Where to find more information  National Sleep Foundation website:  https://sleepfoundation.org  National Heart, Lung, and Blood Institute (NHLBI): www.nhlbi.nih.gov/files/docs/public/sleep/healthy_sleep.pdf  Centers for Disease Control and Prevention (CDC): www.cdc.gov/sleep/index.html Contact a health care provider if you:  Have trouble getting to sleep or staying asleep.  Often wake up very early in the morning and cannot get back to sleep.  Have daytime sleepiness.  Have daytime sleep attacks of suddenly falling asleep and sudden muscle weakness (narcolepsy).  Have a tingling sensation in your legs with a strong urge to move your legs (restless legs syndrome).  Stop breathing briefly during sleep (sleep apnea).  Think you have a sleep disorder or are taking a medicine that is affecting your quality of sleep. Summary  Most adults need 7-8 hours of quality sleep each night.  Getting enough quality sleep is an important part of health and well-being.  Make your bedroom a place that promotes quality sleep and avoid things that may cause you to have poor sleep, such as alcohol, caffeine, smoking, and large meals.  Talk to your health care provider if you have trouble falling asleep or staying asleep. This information is not intended to replace advice given to you by your health care provider. Make sure you discuss any questions you have with your health care provider. Document Revised: 09/12/2017 Document Reviewed: 09/12/2017 Elsevier Patient Education  2020 Elsevier Inc.  

## 2019-11-12 ENCOUNTER — Other Ambulatory Visit: Payer: Self-pay | Admitting: Family Medicine

## 2019-11-21 ENCOUNTER — Encounter: Payer: Self-pay | Admitting: Nurse Practitioner

## 2019-11-21 ENCOUNTER — Other Ambulatory Visit: Payer: Self-pay

## 2019-11-21 ENCOUNTER — Ambulatory Visit (INDEPENDENT_AMBULATORY_CARE_PROVIDER_SITE_OTHER): Payer: PRIVATE HEALTH INSURANCE | Admitting: Nurse Practitioner

## 2019-11-21 VITALS — BP 136/82 | Temp 94.9°F | Wt 181.8 lb

## 2019-11-21 DIAGNOSIS — F419 Anxiety disorder, unspecified: Secondary | ICD-10-CM | POA: Diagnosis not present

## 2019-11-21 DIAGNOSIS — F5104 Psychophysiologic insomnia: Secondary | ICD-10-CM

## 2019-11-21 DIAGNOSIS — F329 Major depressive disorder, single episode, unspecified: Secondary | ICD-10-CM | POA: Diagnosis not present

## 2019-11-21 MED ORDER — ESCITALOPRAM OXALATE 10 MG PO TABS: 10.0000 mg | ORAL_TABLET | Freq: Every day | ORAL | 6 refills | Status: DC

## 2019-11-21 NOTE — Progress Notes (Signed)
   Subjective:    Patient ID: [name], female    DOB: Nov 30, 1960, 59 y.o.   MRN: [id number]  HPI Pt here today for medication follow up. Pt is taking medication as prescribed. States she is doing well on Lexapro 10 mg daily.  Is being followed by neurologist for balance issues.  Is scheduled for sleep study in the near future.  Continues to have issues with chronic insomnia.  Limited use of Xanax only if she has not had sleep for several days. Limited activity or exercise due to a very busy schedule including her family.  Depression screen [facility] 2/9 11/21/2019 10/27/2019 07/28/2019 05/30/2019 03/24/2019  Decreased Interest 0 0 0 0 0  Down, Depressed, Hopeless 0 0 0 0 0  PHQ - 2 Score 0 0 0 0 0  Altered sleeping 1 1 2  - 0  Tired, decreased energy 1 3 1  - 0  Change in appetite 0 0 0 - 0  Feeling bad or failure about yourself  0 0 0 - 0  Trouble concentrating 1 3 3  - 0  Moving slowly or fidgety/restless 0 0 0 - 0  Suicidal thoughts 0 0 0 - 0  PHQ-9 Score 3 7 6  - 0  Difficult doing work/chores Somewhat difficult Not difficult at all Not difficult at all - -     Review of Systems     Objective:   Physical Exam NAD.  Alert, oriented.  Mildly fatigued in appearance.  Puffiness and dark circles noted below both eyes.  Lungs clear.  Heart regular rate rhythm.  Calm affect.  Thoughts logical coherent and relevant.  Dressed appropriately.  Making good eye contact.     Assessment & Plan:   Problem List Items Addressed This Visit      Other   Anxiety and depression - Primary   Relevant Medications   escitalopram (LEXAPRO) 10 MG tablet   Chronic insomnia     Meds ordered this encounter  Medications  . escitalopram (LEXAPRO) 10 MG tablet    Sig: Take 1 tablet (10 mg total) by mouth daily.    Dispense:  30 tablet    Refill:  6    Order Specific Question:   Supervising Provider    Answer:   Sallee Lange A [[id number]]   Continue Lexapro as directed.  Patient to consider adding trazodone  to her current regimen once we get results from her sleep study.  She will contact the office to let us know if she would like to start this.  Continue to use Xanax sparingly. Return in about 6 months (around 05/22/2020).

## 2019-11-25 ENCOUNTER — Ambulatory Visit: Payer: PRIVATE HEALTH INSURANCE | Admitting: Internal Medicine

## 2019-12-08 ENCOUNTER — Ambulatory Visit (INDEPENDENT_AMBULATORY_CARE_PROVIDER_SITE_OTHER): Payer: PRIVATE HEALTH INSURANCE | Admitting: Neurology

## 2019-12-08 DIAGNOSIS — N951 Menopausal and female climacteric states: Secondary | ICD-10-CM

## 2019-12-08 DIAGNOSIS — R0683 Snoring: Secondary | ICD-10-CM | POA: Diagnosis not present

## 2019-12-08 DIAGNOSIS — R519 Headache, unspecified: Secondary | ICD-10-CM

## 2019-12-08 DIAGNOSIS — F5104 Psychophysiologic insomnia: Secondary | ICD-10-CM

## 2019-12-08 DIAGNOSIS — R5382 Chronic fatigue, unspecified: Secondary | ICD-10-CM

## 2019-12-09 ENCOUNTER — Ambulatory Visit (INDEPENDENT_AMBULATORY_CARE_PROVIDER_SITE_OTHER): Payer: PRIVATE HEALTH INSURANCE | Admitting: Neurology

## 2019-12-09 ENCOUNTER — Encounter: Payer: Self-pay | Admitting: Neurology

## 2019-12-09 DIAGNOSIS — M542 Cervicalgia: Secondary | ICD-10-CM

## 2019-12-09 DIAGNOSIS — R5382 Chronic fatigue, unspecified: Secondary | ICD-10-CM

## 2019-12-09 DIAGNOSIS — M62838 Other muscle spasm: Secondary | ICD-10-CM

## 2019-12-09 NOTE — Progress Notes (Signed)
Please refer to EMG and nerve conduction procedure note.  

## 2019-12-09 NOTE — Procedures (Signed)
     HISTORY:  [name] is a 59 year old patient with a several year history of neck and shoulder discomfort and pain down the right arm.  The patient is being evaluated for a possible neuropathy or a cervical radiculopathy.  NERVE CONDUCTION STUDIES:  Nerve conduction studies were performed on the right upper extremity. The distal motor latencies and motor amplitudes for the median and ulnar nerves were within normal limits. The nerve conduction velocities for these nerves were also normal. The sensory latencies for the median and ulnar nerves were normal. The F wave latency for the ulnar nerve was within normal limits.   EMG STUDIES:  EMG study was performed on the right upper extremity:  The first dorsal interosseous muscle reveals 2 to 4 K units with full recruitment. No fibrillations or positive waves were noted. The abductor pollicis brevis muscle reveals 2 to 4 K units with full recruitment. No fibrillations or positive waves were noted. The extensor indicis proprius muscle reveals 1 to 3 K units with full recruitment. No fibrillations or positive waves were noted. The pronator teres muscle reveals 2 to 3 K units with full recruitment. No fibrillations or positive waves were noted. The biceps muscle reveals 1 to 2 K units with full recruitment. No fibrillations or positive waves were noted. The triceps muscle reveals 2 to 4 K units with full recruitment. No fibrillations or positive waves were noted. The anterior deltoid muscle reveals 2 to 3 K units with full recruitment. No fibrillations or positive waves were noted. The cervical paraspinal muscles were tested at 2 levels. No abnormalities of insertional activity were seen at either level tested. There was poor relaxation.   IMPRESSION:  Nerve conduction studies done on the right upper extremity were unremarkable, there is no evidence of a neuropathy.  EMG evaluation of the right upper extremity was unremarkable, no  evidence of an overlying cervical radiculopathy was seen.  Jill Alexanders MD 12/09/2019 1:30 PM  [facility] [address] [place], [place] [address]  Phone [phone] Fax [phone]

## 2019-12-09 NOTE — Progress Notes (Addendum)
IMPRESSION:   Nerve conduction studies done on the right upper extremity were  unremarkable, there is no evidence of a neuropathy. EMG  evaluation of the right upper extremity was unremarkable, no  evidence of an overlying cervical radiculopathy was seen.   Jill Alexanders MD  12/09/2019 1:30 PM       [facility]    Nerve / Sites Muscle Latency Ref. Amplitude Ref. Rel Amp Segments Distance Velocity Ref. Area    ms ms mV mV %  cm m/s m/s mVms  R Median - APB     Wrist APB 3.1 ?4.4 5.6 ?4.0 100 Wrist - APB 7   16.1     Upper arm APB 7.3  5.9  106 Upper arm - Wrist 23 55 ?49 22.1  R Ulnar - ADM     Wrist ADM 2.8 ?3.3 11.0 ?6.0 100 Wrist - ADM 7   32.9     B.Elbow ADM 6.3  10.2  92.5 B.Elbow - Wrist 20 59 ?49 31.1     A.Elbow ADM 7.9  10.1  99.1 A.Elbow - B.Elbow 10 62 ?49 29.7         A.Elbow - Wrist             SNC    Nerve / Sites Rec. Site Peak Lat Ref.  Amp Ref. Segments Distance Peak Diff Ref.    ms ms V V  cm ms ms  R Median, Ulnar - Transcarpal comparison     Median Palm Wrist 1.9 ?2.2 93 ?35 Median Palm - Wrist 8       Ulnar Palm Wrist 1.9 ?2.2 28 ?12 Ulnar Palm - Wrist 8          Median Palm - Ulnar Palm  -0.0 ?0.4  R Median - Orthodromic (Dig II, Mid palm)     Dig II Wrist 2.9 ?3.4 16 ?10 Dig II - Wrist 13    R Ulnar - Orthodromic, (Dig V, Mid palm)     Dig V Wrist 2.7 ?3.1 11 ?5 Dig V - Wrist 33             F  Wave    Nerve F Lat Ref.   ms ms  R Ulnar - ADM 26.4 ?32.0

## 2019-12-10 ENCOUNTER — Other Ambulatory Visit: Payer: Self-pay | Admitting: Neurology

## 2019-12-10 DIAGNOSIS — R5382 Chronic fatigue, unspecified: Secondary | ICD-10-CM

## 2019-12-10 DIAGNOSIS — M62838 Other muscle spasm: Secondary | ICD-10-CM

## 2019-12-10 DIAGNOSIS — N951 Menopausal and female climacteric states: Secondary | ICD-10-CM

## 2019-12-11 ENCOUNTER — Telehealth: Payer: Self-pay | Admitting: Neurology

## 2019-12-11 ENCOUNTER — Other Ambulatory Visit: Payer: Self-pay | Admitting: Neurology

## 2019-12-11 DIAGNOSIS — M62838 Other muscle spasm: Secondary | ICD-10-CM

## 2019-12-11 DIAGNOSIS — M5412 Radiculopathy, cervical region: Secondary | ICD-10-CM

## 2019-12-11 DIAGNOSIS — I10 Essential (primary) hypertension: Secondary | ICD-10-CM

## 2019-12-11 DIAGNOSIS — R200 Anesthesia of skin: Secondary | ICD-10-CM

## 2019-12-11 DIAGNOSIS — F5104 Psychophysiologic insomnia: Secondary | ICD-10-CM

## 2019-12-11 NOTE — Telephone Encounter (Signed)
[facility] order sent to GI. They will obtain the auth and reach out to the patient to schedule.  

## 2019-12-12 ENCOUNTER — Other Ambulatory Visit: Payer: Self-pay | Admitting: Neurology

## 2019-12-15 ENCOUNTER — Other Ambulatory Visit: Payer: Self-pay | Admitting: Family Medicine

## 2019-12-18 ENCOUNTER — Telehealth: Payer: Self-pay | Admitting: Neurology

## 2019-12-18 ENCOUNTER — Encounter: Payer: Self-pay | Admitting: Neurology

## 2019-12-18 NOTE — Progress Notes (Signed)
IMPRESSION: No evidence of sleep disordered breathing, PLM disorder or hypoxemia, all night in regular NSR.   RECOMMENDATIONS:  Referral to a cognitive behavioral therapist is recommended for chronic Insomnia without organic cause .

## 2019-12-18 NOTE — Telephone Encounter (Signed)
Called patient to discuss sleep study results. No answer at this time. LVM for the patient to call back.  Will send a mychart message as well. 

## 2019-12-18 NOTE — Procedures (Signed)
PATIENT'S NAME:  [name], [name] DOB:      [birth year]      MR#:    [id number]     DATE OF RECORDING: 12/08/2019 REFERRING M.D.:  R.Sanders, MD Study Performed:   Baseline Polysomnogram HISTORY:  59 year-old [place] American female patient and seen upon a referral on 11/10/2019 from Dr. Baird Cancer, MD and Sallee Lange, MD .  Chief concern according to patient: " I am snoring, fatigued, have no energy, I am not in pain but sore"- "I have slept well only those nights I had taken Xanax'.    I have the pleasure of seeing [name] today, a right-handed African-American female with chronic Insomnia and a medical history of Current and longstanding use of estrogen therapy (02/24/2015), Depression(04/21/2015), Gastroesophageal reflux disease, Hair loss (02/24/2015), Hot flashes (02/24/2015), and Hypertension. She was seen by Dr Leta Baptist for left sided carpal tunnel.  She reports not feeling refreshed or restored in AM, with symptoms such as dry mouth, every morning with headaches and residual fatigue.  Naps are taken infrequently, on Sundays- 2-3 hours.   The patient endorsed the Epworth Sleepiness Scale at 8 points.   The patient's weight 179 pounds with a height of 66 (inches), resulting in a BMI of 28.7 kg/m2. The patient's neck circumference measured 13 inches.  CURRENT MEDICATIONS: Xanax, Lexapro, Premarin, Hydrodiuril, Procardia, Progesterone   PROCEDURE:  This is a multichannel digital polysomnogram utilizing the Somnostar 11.2 system.  Electrodes and sensors were applied and monitored per AASM Specifications.   EEG, EOG, Chin and Limb EMG, were sampled at 200 Hz.  ECG, Snore and Nasal Pressure, Thermal Airflow, Respiratory Effort, CPAP Flow and Pressure, Oximetry was sampled at 50 Hz. Digital video and audio were recorded.      BASELINE STUDY: Lights Out was at 22:04 and Lights On at 05:00.  Total recording time (TRT) was 416.5 minutes, with a total sleep time (TST) of 293 minutes.   The patient's  sleep latency was 104.5 minutes.  REM latency was 149.5 minutes.  The sleep efficiency was 70.3 %.     SLEEP ARCHITECTURE: WASO (Wake after sleep onset) was 50 minutes.  There were 25.5 minutes in Stage N1, 117 minutes Stage N2, 130.5 minutes Stage N3 and 20 minutes in Stage REM.  The percentage of Stage N1 was 8.7%, Stage N2 was 39.9%, Stage N3 was 44.5% and Stage R (REM sleep) was 6.8%.    RESPIRATORY ANALYSIS:  There were a total of 2 respiratory events:  0 obstructive apneas, 0 central apneas and 0 mixed apneas with a total of 0 apneas and an apnea index (AI) of 0 /hour. There were 2 hypopneas with a hypopnea index of 0 .4 /hour.      The total APNEA/HYPOPNEA INDEX (AHI) was 0.4/h.  2 events occurred in REM sleep and 0 events in NREM. The REM AHI was  6 /hour, versus a non-REM AHI of 0. The patient spent 108.5 minutes of total sleep time in the supine position and 185 minutes in non-supine. The supine AHI was 1.1 versus a non-supine AHI of 0.0.  OXYGEN SATURATION & C02:  The Wake baseline 02 saturation was 98%, with the lowest being 88%. Time spent below 89% saturation equaled 0 minutes. The patient had a total of 0 Periodic Limb Movements.   The arousals were noted as: 46 were spontaneous, 0 were associated with PLMs, 0 were associated with respiratory events. Snoring was noted. EKG was in keeping with normal sinus rhythm (NSR).  IMPRESSION: No evidence of sleep disordered breathing, PLM disorder or hypoxemia, all night in regular NSR.   RECOMMENDATIONS:  Referral to a cognitive behavioral therapist is recommended for chronic Insomnia patients without organic cause to the insomnia.   I certify that I have reviewed the entire raw data recording prior to the issuance of this report in accordance with the Standards of Accreditation of the American Academy of Sleep Medicine (AASM)   Larey Seat, MD Diplomat, American Board of Psychiatry and Neurology  Diplomat, American Board of  Sleep Medicine Market researcher, [facility] [employer]

## 2019-12-18 NOTE — Telephone Encounter (Signed)
-----   Message from Larey Seat, MD sent at 12/18/2019  8:46 AM EDT ----- IMPRESSION: No evidence of sleep disordered breathing, PLM disorder or hypoxemia, all night in regular NSR.   RECOMMENDATIONS:  Referral to a cognitive behavioral therapist is recommended for chronic Insomnia without organic cause .

## 2019-12-23 NOTE — Telephone Encounter (Signed)
[employer] did not approve the MRI Cervical spine.   Denial Reasoning: Patient with neck pain with radiation. EMGs and nerve conduction studies were normal. There is insufficient clinical information provided to determine medical necessity for MRI of the cervical spine. Medical history and physical examination, including neurological examination. There is no documentation that the patient has completed at least six weeks of recent medically supervised conservative therapy, including analgesics/ NSAIDs, avoidance of precipitating activities, exercise/PT. There is no indication that the patient is being considered for invasive therapy, such as epidural steroid injections or surgery.   There is an option to do a peer to peer. You would just contact Nurse Tanzania B at [phone] option 1 ext. [phone]. It also needs to be scheduled.

## 2019-12-24 ENCOUNTER — Other Ambulatory Visit: Payer: PRIVATE HEALTH INSURANCE

## 2019-12-24 ENCOUNTER — Encounter: Payer: Self-pay | Admitting: Neurology

## 2019-12-24 NOTE — Telephone Encounter (Signed)
I will be happy to refer to PT. If not successful, will repeat order for MRI cervical spine.

## 2019-12-24 NOTE — Addendum Note (Signed)
Addended by: Larey Seat on: 12/24/2019 11:05 AM   Modules accepted: Orders

## 2020-01-17 ENCOUNTER — Other Ambulatory Visit: Payer: Self-pay | Admitting: Family Medicine

## 2020-01-19 ENCOUNTER — Ambulatory Visit (HOSPITAL_COMMUNITY): Payer: PRIVATE HEALTH INSURANCE | Attending: Neurology | Admitting: Physical Therapy

## 2020-01-19 ENCOUNTER — Encounter (HOSPITAL_COMMUNITY): Payer: Self-pay | Admitting: Physical Therapy

## 2020-01-19 ENCOUNTER — Other Ambulatory Visit: Payer: Self-pay

## 2020-01-19 DIAGNOSIS — M6281 Muscle weakness (generalized): Secondary | ICD-10-CM | POA: Diagnosis present

## 2020-01-19 DIAGNOSIS — R29898 Other symptoms and signs involving the musculoskeletal system: Secondary | ICD-10-CM | POA: Insufficient documentation

## 2020-01-19 DIAGNOSIS — R2689 Other abnormalities of gait and mobility: Secondary | ICD-10-CM | POA: Diagnosis present

## 2020-01-19 DIAGNOSIS — M542 Cervicalgia: Secondary | ICD-10-CM | POA: Insufficient documentation

## 2020-01-19 NOTE — Therapy (Signed)
[name], [place], [address] Phone: [phone]   Fax:  [phone]  Physical Therapy Evaluation  Patient Details  Name: [name] MRN: [id number] Date of Birth: Dec 11, 1960 Referring Provider (PT): Larey Seat MD   Encounter Date: 01/19/2020   PT End of Session - 01/19/20 1609    Visit Number 1    Number of Visits 8    Date for PT Re-Evaluation 02/16/20    Authorization Type [employer] (VL [id number], no auth)    Authorization - Visit Number 1    Authorization - Number of Visits 60    Progress Note Due on Visit 8    PT Start Time 1518    PT Stop Time 1603    PT Time Calculation (min) 45 min    Activity Tolerance Patient tolerated treatment well;Patient limited by pain    Behavior During Therapy [facility] for tasks assessed/performed           Past Medical History:  Diagnosis Date  . Current use of estrogen therapy 02/24/2015  . Depressed 04/21/2015  . Gastroesophageal reflux disease   . Hair loss 02/24/2015  . Hot flashes 02/24/2015  . Hypertension     Past Surgical History:  Procedure Laterality Date  . CHOLECYSTECTOMY    . COLONOSCOPY  07/04/2011   sessile polyp in the mid transverse colon/moderate diverticulosis in the sigmoid to descending colon segments/internal hemorrhoids  . ESOPHAGOGASTRODUODENOSCOPY    . retrograde ureteral stone extraction    . Total abdominal hysterectomy with probable salpingo-oophorectomy  2006-8   HEAVY MENSES/? CANCER  . TUBAL LIGATION      There were no vitals filed for this visit.    Subjective Assessment - 01/19/20 1520    Subjective Patient is a 59 y.o. female who presents to physical therapy with c/o cervical radiculopathy. Patient states intermittent symptoms from her neck down to her hand in her RUE. She states pain is an achy pain that different in intensity. Patient states throbbing pain when waking in the morning. Pain is better when she rests her arm on a pillow. She  states her symptoms have been going on for 3 years. It was not bother her as much but lately it has been bothering her more lately. Symptoms began insidious onset. Her main goal for physical therapy is to get some relief. Symptoms worse on backside of arm. She does have intermittent headaches. She states balance being off.    Limitations House hold activities;Writing    Currently in Pain? Yes    Pain Score 8     Pain Location Neck    Pain Orientation Right              [facility] PT Assessment - 01/19/20 0001      Assessment   Medical Diagnosis Cervical Radiculopathy    Referring Provider (PT) Asencion Partridge Dohmeier MD    Onset Date/Surgical Date 01/18/17    Next MD Visit None scheduled    Prior Therapy was evaluated for back but couldnt afford it      Precautions   Precautions None      Restrictions   Weight Bearing Restrictions No      Balance Screen   Has the patient fallen in the past 6 months No    Has the patient had a decrease in activity level because of a fear of falling?  No    Is the patient reluctant to leave their home because of a fear  of falling?  No      Prior Function   Level of Independence Independent    Vocation Part time employment    Lexicographer   Overall Cognitive Status Within Functional Limits for tasks assessed      Observation/Other Assessments   Observations slightly slouched posture, forward head    Focus on Therapeutic Outcomes (FOTO)  39% limited      Sensation   Light Touch Impaired Detail    Light Touch Impaired Details Impaired RUE    Additional Comments decreased throughout R UE      Posture/Postural Control   Posture/Postural Control Postural limitations    Postural Limitations Forward head;Rounded Shoulders      ROM / Strength   AROM / PROM / Strength AROM;Strength      AROM   Overall AROM Comments protraction - no change in symptoms; retraction no change in seated    AROM Assessment Site Cervical     Cervical Flexion 0% limited    Cervical Extension 0% limited    Cervical - Right Side Bend 25% limited    Cervical - Left Side Bend 0% limited    Cervical - Right Rotation 0% limited    Cervical - Left Rotation 0% limited       Strength   Strength Assessment Site Shoulder;Elbow;Wrist;Hand    Right/Left Shoulder Right;Left    Right Shoulder ABduction 4+/5    Left Shoulder ABduction 5/5    Right/Left Elbow Right;Left    Right Elbow Flexion 4+/5    Right Elbow Extension 4+/5    Left Elbow Flexion 5/5    Left Elbow Extension 5/5    Right/Left Wrist Right;Left    Right Wrist Flexion 4/5    Right Wrist Extension 4/5    Left Wrist Flexion 5/5    Left Wrist Extension 5/5    Right/Left hand Right;Left    Right Hand Gross Grasp Impaired    Left Hand Gross Grasp Functional      Palpation   Spinal mobility grossly hypomobile in cervical spine with greatest pain on R, no pain on L, hypomobile and painful 1st rib on R    Palpation comment TTP R cervical paraspinals, Levator scapulae, R UT, R suboccipitals; no tenderness on L                      Objective measurements completed on examination: See above findings.       [facility] Adult PT Treatment/Exercise - 01/19/20 0001      Exercises   Exercises Neck      Neck Exercises: Supine   Neck Retraction 10 reps;3 secs                  PT Education - 01/19/20 1528    Education Details Patient educated on exam findings, scope of PT, POC, initial HEP    Person(s) Educated Patient    Methods Explanation;Demonstration;Handout    Comprehension Verbalized understanding;Returned demonstration            PT Short Term Goals - 01/19/20 1616      PT SHORT TERM GOAL #1   Title Patient will be independent with HEP in order to improve functional outcomes.    Time 2    Period Weeks    Status New    Target Date 02/02/20      PT SHORT TERM GOAL #2   Title Patient will report at  least 25% improvement in symptoms for  improved quality of life.    Time 2    Period Weeks    Status New             PT Long Term Goals - 01/19/20 1616      PT LONG TERM GOAL #1   Title Patient will report at least 75% improvement in symptoms for improved quality of life.    Time 4    Period Weeks    Status New    Target Date 02/16/20      PT LONG TERM GOAL #2   Title Patient will improve FOTO score by at least 5 points in order to indicate improved tolerance to activity.    Time 4    Period Weeks    Status New    Target Date 02/16/20      PT LONG TERM GOAL #3   Title Patient will demonstrate 5/5 bilateral UE strength indicating improved functional strength for ADL.    Time 4    Period Weeks    Status New    Target Date 02/16/20                  Plan - 01/19/20 1610    Clinical Impression Statement Patient is a 60 y.o. female who presents to physical therapy with c/o chronic cervical radiculopathy. She presents with pain limited deficits in cervical and RUE strength, ROM, sensation, endurance, postural impairments, tenderness to palpation, spinal mobility and functional mobility with ADL. She is having to modify and restrict ADL as indicated by FOTO score as well as subjective information and objective measures which is affecting overall participation. Patient will benefit from skilled physical therapy in order to improve function and reduce impairment.    Personal Factors and Comorbidities Comorbidity 3+;Time since onset of injury/illness/exacerbation;Past/Current Experience;Finances    Comorbidities chronic radic, HTN, Headaches, sleep dysfunction    Examination-Activity Limitations Reach Overhead;Sleep;Lift    Examination-Participation Restrictions Meal Prep;Occupation;Laundry;Yard Work    [employer] Evolving/Moderate complexity    Clinical Decision Making Moderate    Rehab Potential Fair    PT Frequency 2x / week    PT Duration 4 weeks    PT Treatment/Interventions  ADLs/Self Care Home Management;Aquatic Therapy;Biofeedback;Cryotherapy;Electrical Stimulation;Iontophoresis 4mg /ml Dexamethasone;Traction;Moist Heat;Ultrasound;Fluidtherapy;Contrast Bath;DME Instruction;Gait training;Stair training;Functional mobility training;Therapeutic activities;Therapeutic exercise;Balance training;Neuromuscular re-education;Patient/family education;Orthotic Fit/Training;Manual techniques;Manual lymph drainage;Compression bandaging;Passive range of motion;Dry needling;Energy conservation;Splinting;Taping;Vasopneumatic Device;Vestibular;Spinal Manipulations;Joint Manipulations    PT Next Visit Plan assess response to initial HEP, trail manual therapy for pain/mobility to c/sp, begin postural strengtheing as able, possibly assess neural tension tests, assess traction    PT Home Exercise Plan 8/2 supine cervical retractions    Consulted and Agree with Plan of Care Patient           Patient will benefit from skilled therapeutic intervention in order to improve the following deficits and impairments:  Decreased activity tolerance, Decreased mobility, Decreased strength, Hypomobility, Impaired sensation, Pain, Impaired perceived functional ability, Dizziness, Increased muscle spasms, Decreased range of motion  Visit Diagnosis: Cervicalgia  Muscle weakness (generalized)  Other abnormalities of gait and mobility  Other symptoms and signs involving the musculoskeletal system     Problem List Patient Active Problem List   Diagnosis Date Noted  . Chronic insomnia 11/10/2019  . Loud snoring 11/10/2019  . Symptoms, such as flushing, sleeplessness, headache, lack of concentration, associated with the menopause 11/10/2019  . Sleep-related headache 11/10/2019  . Menopausal and female climacteric states 10/09/2017  . 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 Anxiety and depression 09/06/2015  . Chronic fatigue 09/06/2015  . Elevated ferritin  08/08/2015  . Prediabetes 04/30/2015  . Depressed 04/21/2015  . Current use of estrogen therapy 02/24/2015  . Hair loss 02/24/2015  . Hot flashes 02/24/2015  . Peripheral edema 11/19/2014  . Essential hypertension, benign 10/25/2012  . Epigastric abdominal pain 11/01/2011  . Abdominal pain 06/29/2011    4:19 PM, 01/19/20 Mearl Latin PT, DPT Physical Therapist at [place], [place], [address] Phone: [phone]   Fax:  [phone]  Name: NICKCOLE BRALLEY MRN: [id number] Date of Birth: Nov 22, 1960

## 2020-01-22 ENCOUNTER — Other Ambulatory Visit: Payer: Self-pay

## 2020-01-22 ENCOUNTER — Ambulatory Visit (HOSPITAL_COMMUNITY): Payer: PRIVATE HEALTH INSURANCE | Admitting: Physical Therapy

## 2020-01-22 ENCOUNTER — Encounter (HOSPITAL_COMMUNITY): Payer: Self-pay | Admitting: Physical Therapy

## 2020-01-22 DIAGNOSIS — M542 Cervicalgia: Secondary | ICD-10-CM | POA: Diagnosis not present

## 2020-01-22 DIAGNOSIS — R2689 Other abnormalities of gait and mobility: Secondary | ICD-10-CM

## 2020-01-22 DIAGNOSIS — R29898 Other symptoms and signs involving the musculoskeletal system: Secondary | ICD-10-CM

## 2020-01-22 DIAGNOSIS — M6281 Muscle weakness (generalized): Secondary | ICD-10-CM

## 2020-01-22 NOTE — Therapy (Signed)
[place], [place], [address] Phone: [phone]   Fax:  [phone]  Physical Therapy Treatment  Patient Details  Name: [name] MRN: [id number] Date of Birth: 03-28-1961 Referring Provider (PT): Larey Seat MD   Encounter Date: 01/22/2020   PT End of Session - 01/22/20 1612    Visit Number 2    Number of Visits 8    Date for PT Re-Evaluation 02/16/20    Authorization Type [employer] (VL [id number], no auth)    Authorization - Visit Number 2    Authorization - Number of Visits 60    Progress Note Due on Visit 8    PT Start Time 1614    PT Stop Time 1700    PT Time Calculation (min) 46 min    Activity Tolerance Patient tolerated treatment well    Behavior During Therapy [facility] for tasks assessed/performed           Past Medical History:  Diagnosis Date  . Current use of estrogen therapy 02/24/2015  . Depressed 04/21/2015  . Gastroesophageal reflux disease   . Hair loss 02/24/2015  . Hot flashes 02/24/2015  . Hypertension     Past Surgical History:  Procedure Laterality Date  . CHOLECYSTECTOMY    . COLONOSCOPY  07/04/2011   sessile polyp in the mid transverse colon/moderate diverticulosis in the sigmoid to descending colon segments/internal hemorrhoids  . ESOPHAGOGASTRODUODENOSCOPY    . retrograde ureteral stone extraction    . Total abdominal hysterectomy with probable salpingo-oophorectomy  2006-8   HEAVY MENSES/? CANCER  . TUBAL LIGATION      There were no vitals filed for this visit.   Subjective Assessment - 01/22/20 1618    Subjective Patient reports compliance with HEP says she thinks this may be helping but not sure. Says her neck is throbbing today, but thinks this is related to a headache.    Limitations House hold activities;Writing    Currently in Pain? Yes    Pain Score 5     Pain Location Neck    Pain Orientation Right                             [facility] Adult PT  Treatment/Exercise - 01/22/20 0001      Neck Exercises: Seated   Cervical Isometrics Right lateral flexion;Left lateral flexion;5 secs;10 reps    Neck Retraction 10 reps;3 secs;Other reps (comment)   10 reps with extension    Other Seated Exercise scap squeeze 10 x 5"     Other Seated Exercise thoracic extension excursions x 10       Manual Therapy   Manual Therapy Soft tissue mobilization    Manual therapy comments manual treatemtn performed separate from all other activity     Soft tissue mobilization IASTM to RT cervical paraspinals, upper trap and levator with patient in seated                     PT Short Term Goals - 01/19/20 1616      PT SHORT TERM GOAL #1   Title Patient will be independent with HEP in order to improve functional outcomes.    Time 2    Period Weeks    Status New    Target Date 02/02/20      PT SHORT TERM GOAL #2   Title Patient will report at least 25% improvement in symptoms  for improved quality of life.    Time 2    Period Weeks    Status New             PT Long Term Goals - 01/19/20 1616      PT LONG TERM GOAL #1   Title Patient will report at least 75% improvement in symptoms for improved quality of life.    Time 4    Period Weeks    Status New    Target Date 02/16/20      PT LONG TERM GOAL #2   Title Patient will improve FOTO score by at least 5 points in order to indicate improved tolerance to activity.    Time 4    Period Weeks    Status New    Target Date 02/16/20      PT LONG TERM GOAL #3   Title Patient will demonstrate 5/5 bilateral UE strength indicating improved functional strength for ADL.    Time 4    Period Weeks    Status New    Target Date 02/16/20                 Plan - 01/22/20 1723    Clinical Impression Statement Patient tolerated session well today. Reviewed goals and HEP. Initiated ther ex program. Progressed postural strengthening and added retraction with extensions. Educated patient on  anatomy of spine and nerve pathways. Added manual IASTM to address complaints of pain and restriction. Patient reported pain reduction to 0-1/10 post treatment. Patient educated on and issued updated HEP handout.    Personal Factors and Comorbidities Comorbidity 3+;Time since onset of injury/illness/exacerbation;Past/Current Experience;Finances    Comorbidities chronic radic, HTN, Headaches, sleep dysfunction    Examination-Activity Limitations Reach Overhead;Sleep;Lift    Examination-Participation Restrictions Meal Prep;Occupation;Laundry;Yard Work    [employer] officer Evolving/Moderate complexity    Rehab Potential Fair    PT Frequency 2x / week    PT Duration 4 weeks    PT Treatment/Interventions ADLs/Self Care Home Management;Aquatic Therapy;Biofeedback;Cryotherapy;Electrical Stimulation;Iontophoresis 4mg /ml Dexamethasone;Traction;Moist Heat;Ultrasound;Fluidtherapy;Contrast Bath;DME Instruction;Gait training;Stair training;Functional mobility training;Therapeutic activities;Therapeutic exercise;Balance training;Neuromuscular re-education;Patient/family education;Orthotic Fit/Training;Manual techniques;Manual lymph drainage;Compression bandaging;Passive range of motion;Dry needling;Energy conservation;Splinting;Taping;Vasopneumatic Device;Vestibular;Spinal Manipulations;Joint Manipulations    PT Next Visit Plan Progress postural strengthening as able, possibly assess neural tension tests, assess traction if indicated    PT Home Exercise Plan 8/2 supine cervical retractions 8/5: retraction with extension, scap retraction    Consulted and Agree with Plan of Care Patient           Patient will benefit from skilled therapeutic intervention in order to improve the following deficits and impairments:  Decreased activity tolerance, Decreased mobility, Decreased strength, Hypomobility, Impaired sensation, Pain, Impaired perceived functional ability, Dizziness, Increased muscle spasms,  Decreased range of motion  Visit Diagnosis: Cervicalgia  Muscle weakness (generalized)  Other abnormalities of gait and mobility  Other symptoms and signs involving the musculoskeletal system     Problem List Patient Active Problem List   Diagnosis Date Noted  . Chronic insomnia 11/10/2019  . Loud snoring 11/10/2019  . Symptoms, such as flushing, sleeplessness, headache, lack of concentration, associated with the menopause 11/10/2019  . Sleep-related headache 11/10/2019  . Menopausal and female climacteric states 10/09/2017  . 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 Lichen planopilaris 54/27/0623  . Anxiety and depression 09/06/2015  . Chronic fatigue 09/06/2015  . Elevated ferritin 08/08/2015  . Prediabetes 04/30/2015  . Depressed 04/21/2015  . Current use of estrogen therapy 02/24/2015  . Hair loss  02/24/2015  . Hot flashes 02/24/2015  . Peripheral edema 11/19/2014  . Essential hypertension, benign 10/25/2012  . Epigastric abdominal pain 11/01/2011  . Abdominal pain 06/29/2011   5:26 PM, 01/22/20 Josue Hector PT DPT  Physical Therapist with [facility]  [phone] [place] [address] [place], [place], [address] Phone: [phone]   Fax:  [phone]  Name: [name] MRN: [id number] Date of Birth: 1960/10/18

## 2020-01-26 ENCOUNTER — Telehealth (HOSPITAL_COMMUNITY): Payer: Self-pay | Admitting: Physical Therapy

## 2020-01-26 ENCOUNTER — Ambulatory Visit (HOSPITAL_COMMUNITY): Payer: PRIVATE HEALTH INSURANCE | Admitting: Physical Therapy

## 2020-01-26 NOTE — Telephone Encounter (Signed)
pt cancelled appt for today because she does not feel well today

## 2020-01-29 ENCOUNTER — Telehealth (HOSPITAL_COMMUNITY): Payer: Self-pay | Admitting: Physical Therapy

## 2020-01-29 ENCOUNTER — Ambulatory Visit (HOSPITAL_COMMUNITY): Payer: PRIVATE HEALTH INSURANCE | Admitting: Physical Therapy

## 2020-01-29 NOTE — Telephone Encounter (Signed)
Patient no show #1. Patient did not answer when called and no option to leave message.  2:26 PM, 01/29/20 Mearl Latin PT, DPT Physical Therapist at [facility]

## 2020-02-04 ENCOUNTER — Telehealth (HOSPITAL_COMMUNITY): Payer: Self-pay | Admitting: Physical Therapy

## 2020-02-04 ENCOUNTER — Encounter (HOSPITAL_COMMUNITY): Payer: Self-pay | Admitting: Physical Therapy

## 2020-02-04 NOTE — Therapy (Signed)
[place], [place], [address] Phone: [phone]   Fax:  [phone]  Patient Details  Name: JOYCELIN [name] MRN: [id number] Date of Birth: June 17, 1961 Referring Provider:  Larey Seat MD Encounter Date: 02/04/2020  PHYSICAL THERAPY DISCHARGE SUMMARY  Visits from Start of Care: 2  Current functional level related to goals / functional outcomes: Patient called to say she is feeling better and would like to DC from therapy to continue exercise at home.    Remaining deficits: Unable to conduct formal reassess as patient request self DC at this time    Education / Equipment: N/A Plan: Patient agrees to discharge.  Patient goals were not met. Patient is being discharged due to being pleased with the current functional level.  ?????       4:19 PM, 02/04/20 [name] PT DPT  Physical Therapist with [facility]  [phone] [place] [address] [place], [place], [address] Phone: [phone]   Fax:  [phone]

## 2020-02-04 NOTE — Telephone Encounter (Signed)
pt asked to cancel all of her remaining appts, because she feels better and will continue excercises at home

## 2020-02-05 ENCOUNTER — Ambulatory Visit (HOSPITAL_COMMUNITY): Payer: PRIVATE HEALTH INSURANCE | Admitting: Physical Therapy

## 2020-02-06 ENCOUNTER — Encounter (HOSPITAL_COMMUNITY): Payer: PRIVATE HEALTH INSURANCE

## 2020-02-11 ENCOUNTER — Encounter (HOSPITAL_COMMUNITY): Payer: PRIVATE HEALTH INSURANCE

## 2020-02-17 ENCOUNTER — Encounter (HOSPITAL_COMMUNITY): Payer: PRIVATE HEALTH INSURANCE

## 2020-02-19 ENCOUNTER — Other Ambulatory Visit: Payer: Self-pay | Admitting: Family Medicine

## 2020-02-19 ENCOUNTER — Ambulatory Visit (HOSPITAL_COMMUNITY): Payer: PRIVATE HEALTH INSURANCE | Admitting: Physical Therapy

## 2020-02-19 ENCOUNTER — Encounter (HOSPITAL_COMMUNITY): Payer: PRIVATE HEALTH INSURANCE

## 2020-03-16 ENCOUNTER — Other Ambulatory Visit: Payer: Self-pay | Admitting: Internal Medicine

## 2020-05-31 ENCOUNTER — Other Ambulatory Visit: Payer: Self-pay | Admitting: Internal Medicine

## 2020-05-31 ENCOUNTER — Other Ambulatory Visit: Payer: Self-pay | Admitting: Family Medicine

## 2020-06-08 ENCOUNTER — Other Ambulatory Visit: Payer: Self-pay | Admitting: Internal Medicine

## 2020-06-09 NOTE — Telephone Encounter (Signed)
Last 10/27/19 Next 06/29/20  I dont know how hormone stuff works

## 2020-06-14 ENCOUNTER — Encounter: Payer: Self-pay | Admitting: Family Medicine

## 2020-06-14 ENCOUNTER — Other Ambulatory Visit: Payer: Self-pay

## 2020-06-14 ENCOUNTER — Ambulatory Visit (INDEPENDENT_AMBULATORY_CARE_PROVIDER_SITE_OTHER): Payer: PRIVATE HEALTH INSURANCE | Admitting: Family Medicine

## 2020-06-14 VITALS — HR 79 | Temp 97.9°F | Resp 16

## 2020-06-14 DIAGNOSIS — Z20822 Contact with and (suspected) exposure to covid-19: Secondary | ICD-10-CM

## 2020-06-14 DIAGNOSIS — R059 Cough, unspecified: Secondary | ICD-10-CM

## 2020-06-14 DIAGNOSIS — B9689 Other specified bacterial agents as the cause of diseases classified elsewhere: Secondary | ICD-10-CM | POA: Diagnosis not present

## 2020-06-14 DIAGNOSIS — J019 Acute sinusitis, unspecified: Secondary | ICD-10-CM | POA: Diagnosis not present

## 2020-06-14 MED ORDER — AMOXICILLIN-POT CLAVULANATE 875-125 MG PO TABS
1.0000 | ORAL_TABLET | Freq: Two times a day (BID) | ORAL | 0 refills | Status: DC
Start: 2020-06-14 — End: 2020-07-02

## 2020-06-14 MED ORDER — BENZONATATE 100 MG PO CAPS: 100.0000 mg | ORAL_CAPSULE | Freq: Two times a day (BID) | ORAL | 0 refills | Status: DC | PRN

## 2020-06-14 MED ORDER — BUDESONIDE-FORMOTEROL FUMARATE 80-4.5 MCG/ACT IN AERO: 2.0000 | INHALATION_SPRAY | Freq: Two times a day (BID) | RESPIRATORY_TRACT | 0 refills | Status: DC

## 2020-06-14 NOTE — Progress Notes (Signed)
Patient ID: [name], female    DOB: November 29, 1960, 59 y.o.   MRN: [id number]   Chief Complaint  Patient presents with  . Cough   Subjective:  CC: cough and headache  This is a new problem.  Presents for an acute outside visit with a complaint of cough present for 3 days, and a headache which started today.  She reports that she just received notice that her son's girlfriend's daughter is positive for Covid, and they spent the day together on Christmas.  She does report that everyone wore mask, it is unsure how mealtime was handled.  She has tried Robitussin with honey, and Delsym with minimal relief.  She does have a history of Covid infection in the past.  She has tried humidifier and sleeping upright on 3 pillows.  She desires something stronger for cough, however, she has a hydrocodone allergy and this will limit these choices.  She denies fever, chills, chest pain, shortness of breath.   cough and headache for 3 days. Has not had a covid test. Tried robitussin with honey and delsyum.     Medical History [name] has a past medical history of Current use of estrogen therapy (02/24/2015), Depressed (04/21/2015), Gastroesophageal reflux disease, Hair loss (02/24/2015), Hot flashes (02/24/2015), and Hypertension.   Outpatient Encounter Medications as of 06/14/2020  Medication Sig  . ALPRAZolam (XANAX) 0.5 MG tablet TAKE (1/2) TO (1) TABLET TWICE DAILY AS NEEDED.  Marland Kitchen amoxicillin-clavulanate (AUGMENTIN) 875-125 MG tablet Take 1 tablet by mouth 2 (two) times daily.  . Ascorbic Acid (VITAMIN C PO) Take by mouth.  . benzonatate (TESSALON) 100 MG capsule Take 1 capsule (100 mg total) by mouth 2 (two) times daily as needed for cough.  Marland Kitchen BIOTIN PO Take by mouth daily.  . budesonide-formoterol (SYMBICORT) 80-4.5 MCG/ACT inhaler Inhale 2 puffs into the lungs 2 (two) times daily.  Marland Kitchen escitalopram (LEXAPRO) 10 MG tablet Take 1 tablet (10 mg total) by mouth daily.  Marland Kitchen estrogens, conjugated, (PREMARIN) 0.3  MG tablet TAKE ONE TABLET BY MOUTH ONCE DAILY.  Marland Kitchen GINKGO BILOBA PO Take by mouth.  . hydrochlorothiazide (HYDRODIURIL) 25 MG tablet TAKE (1) TABLET BY MOUTH PRN  . Multiple Vitamin (MULTIVITAMIN) capsule Take 1 capsule by mouth daily.  Marland Kitchen NIFEdipine (PROCARDIA-XL/NIFEDICAL-XL) 30 MG 24 hr tablet TAKE (1) TABLET BY MOUTH ONCE DAILY.  . NON FORMULARY Estriol / testosterone 0.25, apply 1 ML vaginally 3 times per week  . potassium chloride (KLOR-CON) 10 MEQ tablet Take one tablet by mouth prn  . Probiotic Product (ALIGN) 4 MG CAPS Take by mouth daily.  . progesterone (PROMETRIUM) 100 MG capsule Take 200 mg by mouth. Daily except sunday  . vitamin B-12 (CYANOCOBALAMIN) 1000 MCG tablet Take 1,000 mcg by mouth daily.  . Vitamin D, Ergocalciferol, (DRISDOL) 1.25 MG (50000 UNIT) CAPS capsule Take 1 capsule (50,000 Units total) by mouth every 7 (seven) days.   No facility-administered encounter medications on file as of 06/14/2020.     Review of Systems  Constitutional: Negative for chills and fever.  HENT: Negative for ear pain.   Respiratory: Positive for cough. Negative for shortness of breath.   Cardiovascular: Negative for chest pain.  Gastrointestinal: Negative for abdominal pain.  Neurological: Positive for headaches.     Vitals Pulse 79   Temp 97.9 F (36.6 C)   Resp 16   SpO2 97%   Objective:   Physical Exam Vitals reviewed.  Constitutional:      General: She is not  in acute distress.    Appearance: Normal appearance. She is not toxic-appearing.  HENT:     Right Ear: Tympanic membrane normal.     Left Ear: Tympanic membrane normal.     Nose:     Right Sinus: Maxillary sinus tenderness and frontal sinus tenderness present.     Left Sinus: Maxillary sinus tenderness and frontal sinus tenderness present.     Mouth/Throat:     Mouth: Mucous membranes are moist.     Pharynx: Oropharynx is clear.  Cardiovascular:     Rate and Rhythm: Normal rate and regular rhythm.      Heart sounds: Normal heart sounds.  Pulmonary:     Effort: Pulmonary effort is normal.     Breath sounds: Normal breath sounds. No wheezing.  Skin:    General: Skin is warm and dry.  Neurological:     General: No focal deficit present.     Mental Status: She is alert.  Psychiatric:        Behavior: Behavior normal.      Assessment and Plan   1. Cough - Novel Coronavirus, NAA ([facility]) - benzonatate (TESSALON) 100 MG capsule; Take 1 capsule (100 mg total) by mouth 2 (two) times daily as needed for cough.  Dispense: 20 capsule; Refill: 0 - budesonide-formoterol (SYMBICORT) 80-4.5 MCG/ACT inhaler; Inhale 2 puffs into the lungs 2 (two) times daily.  Dispense: 1 each; Refill: 0  2. Acute bacterial rhinosinusitis - amoxicillin-clavulanate (AUGMENTIN) 875-125 MG tablet; Take 1 tablet by mouth 2 (two) times daily.  Dispense: 20 tablet; Refill: 0  3. Close exposure to COVID-19 virus - Novel Coronavirus, NAA ([facility])   Due to significant maxillary and frontal sinus pain and pressure, will treat for acute bacterial rhinosinusitis.  Recommend supportive therapy with humidifier, sleeping upright on pillows, Tessalon Perles, and Symbicort inhaler for the cough.  Covid-19 respiratory warning: Covid-19 is a virus that causes hypoxia (low oxygen level in blood) in some people. If you develop any changes in your usual breathing pattern: difficulty catching your breath, more short winded with activity or with resting, or anything that concerns you about your breathing, do not hesitate to go to the emergency department immediately for evaluation. Please do not delay to get treatment.    Agrees with plan of care discussed today. Understands warning signs to seek further care: Fever, chills, shortness of breath, any significant change in health.  COVID-19 respiratory warning as stated above given. Understands to follow-up if symptoms do not improve, or worsen.  Will notify once Covid results are  available.  Instructed her to act as though she is contagious at this point as she had a significant Covid exposure.     Anita Ades, FNP-C 06/14/2020

## 2020-06-15 ENCOUNTER — Other Ambulatory Visit: Payer: Self-pay

## 2020-06-15 ENCOUNTER — Telehealth: Payer: PRIVATE HEALTH INSURANCE | Admitting: Nurse Practitioner

## 2020-06-16 LAB — SARS-COV-2, NAA 2 DAY TAT

## 2020-06-16 LAB — NOVEL CORONAVIRUS, NAA: SARS-CoV-2, NAA: NOT DETECTED

## 2020-06-29 ENCOUNTER — Other Ambulatory Visit: Payer: Self-pay

## 2020-06-29 ENCOUNTER — Ambulatory Visit (INDEPENDENT_AMBULATORY_CARE_PROVIDER_SITE_OTHER): Payer: PRIVATE HEALTH INSURANCE | Admitting: Internal Medicine

## 2020-06-29 ENCOUNTER — Encounter: Payer: Self-pay | Admitting: Internal Medicine

## 2020-06-29 VITALS — BP 120/75 | HR 58 | Temp 97.8°F | Ht 66.0 in | Wt 184.8 lb

## 2020-06-29 DIAGNOSIS — R7309 Other abnormal glucose: Secondary | ICD-10-CM

## 2020-06-29 DIAGNOSIS — I10 Essential (primary) hypertension: Secondary | ICD-10-CM | POA: Diagnosis not present

## 2020-06-29 DIAGNOSIS — Z79899 Other long term (current) drug therapy: Secondary | ICD-10-CM | POA: Diagnosis not present

## 2020-06-29 DIAGNOSIS — N951 Menopausal and female climacteric states: Secondary | ICD-10-CM | POA: Diagnosis not present

## 2020-06-29 NOTE — Progress Notes (Signed)
I,[name],acting as a Education administrator for [name], MD.,have documented all relevant documentation on the behalf of [name], MD,as directed by  [name], MD while in the presence of [name], MD.  This visit occurred during the SARS-CoV-2 public health emergency.  Safety protocols were in place, including screening questions prior to the visit, additional usage of staff PPE, and extensive cleaning of exam room while observing appropriate contact time as indicated for disinfecting solutions.  Subjective:     Patient ID: [name] , female    DOB: 1960-12-06 , 60 y.o.   MRN: [id number]   Chief Complaint  Patient presents with  . hormone    HPI  She is here today for f/u BHRT.  She reports compliance with compounded progesterone nightly. She is also stlil taking oral estrogen. She is hesitant to switch to bioidentical estrogen because she is afraid her hot flashes will return.     Past Medical History:  Diagnosis Date  . Current use of estrogen therapy 02/24/2015  . Depressed 04/21/2015  . Gastroesophageal reflux disease   . Hair loss 02/24/2015  . Hot flashes 02/24/2015  . Hypertension      Family History  Problem Relation Age of Onset  . Diabetes type II Mother   . Coronary artery disease Mother   . Diabetes type II Father   . Coronary artery disease Father   . Hypertension Father   . Diabetes type II Sister   . Kidney disease Sister   . Diabetes Brother   . Thyroid disease Sister   . Hypertension Sister   . Hypertension Sister   . Hypertension Brother   . Colon cancer Neg Hx   . Colon polyps Neg Hx   . Breast cancer Neg Hx   . Ovarian cancer Neg Hx   . Uterine cancer Neg Hx   . Ulcerative colitis Neg Hx   . Celiac disease Neg Hx   . Crohn's disease Neg Hx      Current Outpatient Medications:  .  Ascorbic Acid (VITAMIN C PO), Take by mouth., Disp: , Rfl:  .  BIOTIN PO, Take by mouth daily., Disp: , Rfl:  .  GINKGO BILOBA PO, Take by  mouth., Disp: , Rfl:  .  Multiple Vitamin (MULTIVITAMIN) capsule, Take 1 capsule by mouth daily., Disp: , Rfl:  .  NON FORMULARY, Estriol / testosterone 0.25, apply 1 ML vaginally 3 times per week, Disp: , Rfl:  .  Probiotic Product (ALIGN) 4 MG CAPS, Take by mouth daily., Disp: , Rfl:  .  progesterone (PROMETRIUM) 100 MG capsule, Take 200 mg by mouth. Daily except sunday, Disp: , Rfl:  .  vitamin B-12 (CYANOCOBALAMIN) 1000 MCG tablet, Take 1,000 mcg by mouth daily., Disp: , Rfl:  .  ALPRAZolam (XANAX) 0.5 MG tablet, TAKE (1/2) TO (1) TABLET TWICE DAILY AS NEEDED., Disp: 30 tablet, Rfl: 0 .  escitalopram (LEXAPRO) 10 MG tablet, Take 1 tablet (10 mg total) by mouth daily., Disp: 30 tablet, Rfl: 5 .  estradiol (MINIVELLE) 0.0375 MG/24HR, Place 1 patch onto the skin 2 (two) times a week., Disp: 8 patch, Rfl: 1 .  NIFEdipine (PROCARDIA-XL/NIFEDICAL-XL) 30 MG 24 hr tablet, TAKE (1) TABLET BY MOUTH ONCE DAILY for BP, Disp: 30 tablet, Rfl: 5   Allergies  Allergen Reactions  . Plaquenil [Hydroxychloroquine Sulfate]     Itching hives  . Hydrocodone Itching  . Lisinopril     Headache  Dizzy   .  Metronidazole Hives  . Norvasc [Amlodipine Besylate]     Patient did not have true allergy she had side effects with medication     Review of Systems  Constitutional: Negative.   Respiratory: Negative.   Cardiovascular: Negative.   Gastrointestinal: Negative.   Psychiatric/Behavioral: Negative.   All other systems reviewed and are negative.    Today's Vitals   06/29/20 1550  BP: 120/75  Pulse: (!) 58  Temp: 97.8 F (36.6 C)  SpO2: 96%  Weight: 184 lb 12.8 oz (83.8 kg)  Height: '5\' 6"'  (1.676 m)  PainSc: 0-No pain   Body mass index is 29.83 kg/m.  Wt Readings from Last 3 Encounters:  07/02/20 184 lb 3.2 oz (83.6 kg)  06/29/20 184 lb 12.8 oz (83.8 kg)  11/21/19 181 lb 12.8 oz (82.5 kg)    Objective:  Physical Exam Vitals and nursing note reviewed.  Constitutional:      Appearance:  Normal appearance.  HENT:     Head: Normocephalic and atraumatic.  Cardiovascular:     Rate and Rhythm: Normal rate and regular rhythm.     Heart sounds: Normal heart sounds.  Pulmonary:     Effort: Pulmonary effort is normal.     Breath sounds: Normal breath sounds.  Musculoskeletal:     Cervical back: Normal range of motion.  Skin:    General: Skin is warm.  Neurological:     General: No focal deficit present.     Mental Status: She is alert.  Psychiatric:        Mood and Affect: Mood normal.        Behavior: Behavior normal.         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 She still does not want to switch to bioidentical Bi-est. We discussed other treatment options, she agrees to consider estradiol patch. She was   2. Essential hypertension, benign Comments: Chronic, well controlled. She will continue with current meds. She is encouraged to avoid adding salt to her foods.  - CMP14+EGFR  3. Other abnormal glucose Comments: Her a1c has been elevated in the past, I will recheck this today. Encouraged to avoid sugary beverages.  - Hemoglobin A1c  4. Drug therapy Comments: I willl check CMP today. She declined vitamin d bloodwork. Prefers to d/w pcp.   Patient was given opportunity to ask questions. Patient verbalized understanding of the plan and was able to repeat key elements of the plan. All questions were answered to their satisfaction.  [name], MD   I, [name], MD, have reviewed all documentation for this visit. The documentation on 07/09/20 for the exam, diagnosis, procedures, and orders are all accurate and complete.  THE PATIENT IS ENCOURAGED TO PRACTICE SOCIAL DISTANCING DUE TO THE COVID-19 PANDEMIC.

## 2020-06-29 NOTE — Patient Instructions (Signed)
Menopause and Hormone Replacement Therapy Menopause is a normal time of life when menstrual periods stop completely and the ovaries stop producing the female hormones estrogen and progesterone. Low levels of these hormones can affect your health and cause symptoms. Hormone replacement therapy (HRT) can relieve some of those symptoms. HRT is the use of artificial (synthetic) hormones to replace hormones that your body has stopped producing because you have reached menopause. Types of HRT HRT may consist of the synthetic hormones estrogen and progestin, or it may consist of estrogen-only therapy. You and your health care provider will decide which form of HRT is best for you. If you choose to be on HRT and you have a uterus, estrogen and progestin are usually prescribed. Estrogen-only therapy is used for women who do not have a uterus. Possible options for taking HRT include:  Pills.  Patches.  Gels.  Sprays.  Vaginal cream.  Vaginal rings.  Vaginal inserts. The amount of hormones that you take and how long you take them varies according to your health. It is important to:  Begin HRT with the lowest possible dosage.  Stop HRT as soon as your health care provider tells you to stop.  Work with your health care provider so that you feel informed and comfortable with your decisions.   Tell a health care provider about:  Any allergies you have.  Whether you have had blood clots or know of any risk factors you may have for blood clots.  Whether you or family members have had cancer, especially cancer of the breasts, ovaries, or uterus.  Any surgeries you have had.  All medicines you are taking, including vitamins, herbs, eye drops, creams, and over-the-counter medicines.  Whether you are pregnant or may be pregnant.  Any medical conditions you have. What are the benefits? HRT can reduce the frequency and severity of menopausal symptoms. Benefits of HRT vary according to the kind  of symptoms that you have, how severe they are, and your overall health. HRT may help to improve the following symptoms of menopause:  Hot flashes and night sweats. These are sudden feelings of heat that spread over the face and body. The skin may turn red, like a blush. Night sweats are hot flashes that happen while you are sleeping or trying to sleep.  Bone loss (osteoporosis). The body loses calcium more quickly after menopause, causing the bones to become weaker. This can increase the risk for bone breaks (fractures).  Vaginal dryness. The lining of the vagina can become thin and dry, which can cause pain during sex or cause infection, burning, or itching.  Urinary tract infections.  Urinary incontinence. This is the inability to control when you urinate.  Irritability.  Short-term memory problems. What are the risks? Risks of HRT vary depending on your individual health and medical history. Risks of HRT also depend on whether you receive both estrogen and progestin or you receive estrogen only. HRT may increase the risk of:  Spotting. This is when a small amount of blood leaks from the vagina unexpectedly.  Endometrial cancer. This cancer is in the lining of the uterus (endometrium).  Breast cancer.  Increased density of breast tissue. This can make it harder to find breast cancer on a breast X-ray (mammogram).  Stroke.  Heart disease.  Blood clots.  Gallbladder disease or liver disease. Risks of HRT can increase if you have any of the following conditions:  Endometrial cancer.  Liver disease.  Heart disease.  Breast cancer.    History of blood clots.  History of stroke. Follow these instructions at home: Pap tests  Have Pap tests done as often as told by your health care provider. A Pap test is sometimes called a Pap smear. It is a screening test that is used to check for signs of cancer of the cervix and vagina. A Pap test can also identify the presence of  infection or precancerous changes. Pap tests may be done: ? Every 3 years, starting at age 47. ? Every 5 years, starting after age 54, in combination with testing for human papillomavirus (HPV). ? More often or less often depending on other medical conditions you have, your age, and other risk factors.  It is up to you to get the results of your Pap test. Ask your health care provider, or the department that is doing the test, when your results will be ready. General instructions  Take over-the-counter and prescription medicines only as told by your health care provider.  Do not use any products that contain nicotine or tobacco. These products include cigarettes, chewing tobacco, and vaping devices, such as e-cigarettes. If you need help quitting, ask your health care provider.  Get mammograms, pelvic exams, and medical checkups as often as told by your health care provider.  Keep all follow-up visits. This is important. Contact a health care provider if you have:  Pain or swelling in your legs.  Lumps or changes in your breasts or armpits.  Pain, burning, or bleeding when you urinate.  Unusual vaginal bleeding.  Dizziness or headaches.  Pain in your abdomen. Get help right away if you have:  Shortness of breath.  Chest pain.  Slurred speech.  Weakness or numbness in any part of your arms or legs. These symptoms may represent a serious problem that is an emergency. Do not wait to see if the symptoms will go away. Get medical help right away. Call your local emergency services (911 in the [place] Do not drive yourself to the hospital. Summary  Menopause is a normal time of life when menstrual periods stop completely and the ovaries stop producing the female hormones estrogen and progesterone.  HRT can reduce the frequency and severity of menopausal symptoms.  Risks of HRT vary depending on your individual health and medical history. This information is not intended to  replace advice given to you by your health care provider. Make sure you discuss any questions you have with your health care provider. Document Revised: 12/08/2019 Document Reviewed: 12/08/2019 Elsevier Patient Education  [place].

## 2020-06-30 LAB — CMP14+EGFR
ALT: 12 IU/L (ref 0–32)
AST: 17 IU/L (ref 0–40)
Albumin/Globulin Ratio: 1.4 (ref 1.2–2.2)
Albumin: 4 g/dL (ref 3.8–4.9)
Alkaline Phosphatase: 102 IU/L (ref 44–121)
BUN/Creatinine Ratio: 15 (ref 9–23)
BUN: 15 mg/dL (ref 6–24)
Bilirubin Total: 0.3 mg/dL (ref 0.0–1.2)
CO2: 23 mmol/L (ref 20–29)
Calcium: 9 mg/dL (ref 8.7–10.2)
Chloride: 106 mmol/L (ref 96–106)
Creatinine, Ser: 0.97 mg/dL (ref 0.57–1.00)
GFR calc Af Amer: 74 mL/min/{1.73_m2} (ref >59)
GFR calc non Af Amer: 64 mL/min/{1.73_m2} (ref >59)
Globulin, Total: 2.8 g/dL (ref 1.5–4.5)
Glucose: 92 mg/dL (ref 65–99)
Potassium: 4.2 mmol/L (ref 3.5–5.2)
Sodium: 141 mmol/L (ref 134–144)
Total Protein: 6.8 g/dL (ref 6.0–8.5)

## 2020-06-30 LAB — HEMOGLOBIN A1C
Est. average glucose Bld gHb Est-mCnc: 114 mg/dL
Hgb A1c MFr Bld: 5.6 % (ref 4.8–5.6)

## 2020-07-01 ENCOUNTER — Encounter: Payer: Self-pay | Admitting: Internal Medicine

## 2020-07-02 ENCOUNTER — Other Ambulatory Visit: Payer: Self-pay | Admitting: Internal Medicine

## 2020-07-02 ENCOUNTER — Encounter: Payer: Self-pay | Admitting: Nurse Practitioner

## 2020-07-02 ENCOUNTER — Other Ambulatory Visit: Payer: Self-pay

## 2020-07-02 ENCOUNTER — Ambulatory Visit (INDEPENDENT_AMBULATORY_CARE_PROVIDER_SITE_OTHER): Payer: PRIVATE HEALTH INSURANCE | Admitting: Nurse Practitioner

## 2020-07-02 VITALS — BP 139/85 | HR 66 | Temp 95.6°F | Wt 184.2 lb

## 2020-07-02 DIAGNOSIS — F32A Depression, unspecified: Secondary | ICD-10-CM

## 2020-07-02 DIAGNOSIS — I10 Essential (primary) hypertension: Secondary | ICD-10-CM | POA: Diagnosis not present

## 2020-07-02 DIAGNOSIS — F419 Anxiety disorder, unspecified: Secondary | ICD-10-CM | POA: Diagnosis not present

## 2020-07-02 DIAGNOSIS — E559 Vitamin D deficiency, unspecified: Secondary | ICD-10-CM

## 2020-07-02 MED ORDER — ESTRADIOL 0.0375 MG/24HR TD PTTW: 1.0000 | MEDICATED_PATCH | TRANSDERMAL | 1 refills | Status: DC

## 2020-07-02 NOTE — Patient Instructions (Signed)
[facility] 10,000 units per capsule take 2 per week

## 2020-07-02 NOTE — Progress Notes (Addendum)
Subjective:    Patient ID: [name], female    DOB: 1960/07/21, 60 y.o.   MRN: [id number]  HPI Presents for recheck on hypertension.  Adherent to medication regimen.  Is currently off her HCTZ and potassium, no significant edema.  Denies chest pain/ischemic type pain shortness of breath.  Weight has been stable but would like to lose weight. Doing well on Lexapro.  Takes a rare Xanax only for extreme anxiety. Completed her prescription vitamin D and questions whether she needs to start an OTC supplement. Continues follow-up with her specialist regarding chronic hair loss. Review of Systems     Objective:   Physical Exam NAD.  Alert, oriented.  Cheerful affect.  Making good eye contact.  Dressed appropriately.  Thoughts logical coherent and relevant.  Lungs clear.  Heart regular rate and rhythm.  Lower extremities no edema. Today's Vitals   07/02/20 1512  BP: 139/85  Pulse: 66  Temp: (!) 95.6 F (35.3 C)  SpO2: 98%  Weight: 184 lb 3.2 oz (83.6 kg)   Body mass index is 29.73 kg/m. Results for orders placed or performed in visit on 06/29/20  CMP14+EGFR  Result Value Ref Range   Glucose 92 65 - 99 mg/dL   BUN 15 6 - 24 mg/dL   Creatinine, Ser 0.97 0.57 - 1.00 mg/dL   GFR calc non Af Amer 64 >59 mL/min/1.73   GFR calc Af Amer 74 >59 mL/min/1.73   BUN/Creatinine Ratio 15 9 - 23   Sodium 141 134 - 144 mmol/L   Potassium 4.2 3.5 - 5.2 mmol/L   Chloride 106 96 - 106 mmol/L   CO2 23 20 - 29 mmol/L   Calcium 9.0 8.7 - 10.2 mg/dL   Total Protein 6.8 6.0 - 8.5 g/dL   Albumin 4.0 3.8 - 4.9 g/dL   Globulin, Total 2.8 1.5 - 4.5 g/dL   Albumin/Globulin Ratio 1.4 1.2 - 2.2   Bilirubin Total 0.3 0.0 - 1.2 mg/dL   Alkaline Phosphatase 102 44 - 121 IU/L   AST 17 0 - 40 IU/L   ALT 12 0 - 32 IU/L  Hemoglobin A1c  Result Value Ref Range   Hgb A1c MFr Bld 5.6 4.8 - 5.6 %   Est. average glucose Bld gHb Est-mCnc 114 mg/dL   These labs were ordered by Dr. Baird Cancer.     Assessment  & Plan:   Problem List Items Addressed This Visit      Cardiovascular and Mediastinum   Essential hypertension, benign - Primary   Relevant Medications   NIFEdipine (PROCARDIA-XL/NIFEDICAL-XL) 30 MG 24 hr tablet     Other   Anxiety and depression   Relevant Medications   escitalopram (LEXAPRO) 10 MG tablet   ALPRAZolam (XANAX) 0.5 MG tablet   Vitamin D deficiency     Meds ordered this encounter  Medications  . escitalopram (LEXAPRO) 10 MG tablet    Sig: Take 1 tablet (10 mg total) by mouth daily.    Dispense:  30 tablet    Refill:  5    Order Specific Question:   Supervising Provider    Answer:   Sallee Lange A [[id number]]  . NIFEdipine (PROCARDIA-XL/NIFEDICAL-XL) 30 MG 24 hr tablet    Sig: TAKE (1) TABLET BY MOUTH ONCE DAILY for BP    Dispense:  30 tablet    Refill:  5    Order Specific Question:   Supervising Provider    Answer:   Sallee Lange A [[id number]]  . ALPRAZolam (  XANAX) 0.5 MG tablet    Sig: TAKE (1/2) TO (1) TABLET TWICE DAILY AS NEEDED.    Dispense:  30 tablet    Refill:  0    Order Specific Question:   Supervising Provider    Answer:   Sallee Lange A [[id number]]   Continue current medications as directed.  Discussed the importance of healthy diet, regular activity and stress reduction. Reminded patient about wellness exam. Recommend starting OTC vitamin D 10,000 units take 2/week. Return in about 6 months (around 12/30/2020) for BP check up; also needs physical .

## 2020-07-03 ENCOUNTER — Encounter: Payer: Self-pay | Admitting: Nurse Practitioner

## 2020-07-03 ENCOUNTER — Other Ambulatory Visit: Payer: Self-pay | Admitting: Nurse Practitioner

## 2020-07-03 DIAGNOSIS — E559 Vitamin D deficiency, unspecified: Secondary | ICD-10-CM | POA: Insufficient documentation

## 2020-07-03 MED ORDER — NIFEDIPINE ER OSMOTIC RELEASE 30 MG PO TB24: ORAL_TABLET | ORAL | 5 refills | Status: DC

## 2020-07-03 MED ORDER — ESCITALOPRAM OXALATE 10 MG PO TABS: 10.0000 mg | ORAL_TABLET | Freq: Every day | ORAL | 5 refills | Status: DC

## 2020-07-03 MED ORDER — ALPRAZOLAM 0.5 MG PO TABS: ORAL_TABLET | ORAL | 0 refills | Status: DC

## 2020-09-02 ENCOUNTER — Other Ambulatory Visit: Payer: Self-pay | Admitting: Internal Medicine

## 2020-10-04 ENCOUNTER — Other Ambulatory Visit: Payer: Self-pay | Admitting: Internal Medicine

## 2020-10-25 ENCOUNTER — Ambulatory Visit: Payer: PRIVATE HEALTH INSURANCE | Admitting: Internal Medicine

## 2020-11-01 ENCOUNTER — Other Ambulatory Visit: Payer: Self-pay

## 2020-11-01 ENCOUNTER — Encounter: Payer: Self-pay | Admitting: Internal Medicine

## 2020-11-01 ENCOUNTER — Ambulatory Visit (INDEPENDENT_AMBULATORY_CARE_PROVIDER_SITE_OTHER): Payer: PRIVATE HEALTH INSURANCE | Admitting: Internal Medicine

## 2020-11-01 VITALS — BP 112/62 | HR 63 | Temp 98.2°F | Ht 66.0 in | Wt 184.8 lb

## 2020-11-01 DIAGNOSIS — N951 Menopausal and female climacteric states: Secondary | ICD-10-CM

## 2020-11-01 DIAGNOSIS — I10 Essential (primary) hypertension: Secondary | ICD-10-CM

## 2020-11-01 NOTE — Progress Notes (Signed)
I,[name],acting as a Education administrator for [name], MD.,have documented all relevant documentation on the behalf of [name], MD   This visit occurred during the SARS-CoV-2 public health emergency.  Safety protocols were in place, including screening questions prior to the visit, additional usage of staff PPE, and extensive cleaning of exam room while observing appropriate contact time as indicated for disinfecting solutions.  Subjective:     Patient ID: [name] , female    DOB: 1960/10/14 , 60 y.o.   MRN: [id number]   Chief Complaint  Patient presents with  . hormones f/u    HPI  She is here today for f/u BHRT.  She is currently taking Minivelle patch 0.375 twice weekly and progesterone SR 150mg  nightly except Sundays. She reports feeling fine, but states she sweats a lot at night. She denies having vaginal dryness at this time.     Past Medical History:  Diagnosis Date  . Current use of estrogen therapy 02/24/2015  . Depressed 04/21/2015  . Gastroesophageal reflux disease   . Hair loss 02/24/2015  . Hot flashes 02/24/2015  . Hypertension      Family History  Problem Relation Age of Onset  . Diabetes type II Mother   . Coronary artery disease Mother   . Diabetes type II Father   . Coronary artery disease Father   . Hypertension Father   . Diabetes type II Sister   . Kidney disease Sister   . Diabetes Brother   . Thyroid disease Sister   . Hypertension Sister   . Hypertension Sister   . Hypertension Brother   . Colon cancer Neg Hx   . Colon polyps Neg Hx   . Breast cancer Neg Hx   . Ovarian cancer Neg Hx   . Uterine cancer Neg Hx   . Ulcerative colitis Neg Hx   . Celiac disease Neg Hx   . Crohn's disease Neg Hx      Current Outpatient Medications:  .  ALPRAZolam (XANAX) 0.5 MG tablet, TAKE (1/2) TO (1) TABLET TWICE DAILY AS NEEDED., Disp: 30 tablet, Rfl: 0 .  Ascorbic Acid (VITAMIN C PO), Take by mouth., Disp: , Rfl:  .  BIOTIN PO, Take by mouth  daily., Disp: , Rfl:  .  estradiol (VIVELLE-DOT) 0.0375 MG/24HR, PLACE 1 PATCH ONTO THE SKIN TWICE A WEEK, Disp: 8 patch, Rfl: 0 .  Multiple Vitamin (MULTIVITAMIN) capsule, Take 1 capsule by mouth daily., Disp: , Rfl:  .  NIFEdipine (PROCARDIA-XL/NIFEDICAL-XL) 30 MG 24 hr tablet, TAKE (1) TABLET BY MOUTH ONCE DAILY for BP, Disp: 30 tablet, Rfl: 5 .  Probiotic Product (ALIGN) 4 MG CAPS, Take by mouth daily., Disp: , Rfl:  .  progesterone (PROMETRIUM) 100 MG capsule, Take 200 mg by mouth. Daily except sunday, Disp: , Rfl:  .  vitamin B-12 (CYANOCOBALAMIN) 1000 MCG tablet, Take 1,000 mcg by mouth daily., Disp: , Rfl:  .  escitalopram (LEXAPRO) 10 MG tablet, Take 1 tablet (10 mg total) by mouth daily., Disp: 30 tablet, Rfl: 5 .  GINKGO BILOBA PO, Take by mouth. (Patient not taking: Reported on 11/01/2020), Disp: , Rfl:  .  NON FORMULARY, Estriol / testosterone 0.25, apply 1 ML vaginally 3 times per week (Patient not taking: Reported on 11/01/2020), Disp: , Rfl:    Allergies  Allergen Reactions  . Plaquenil [Hydroxychloroquine Sulfate]     Itching hives  . Hydrocodone Itching  . Lisinopril     Headache  Dizzy   .  Metronidazole Hives  . Norvasc [Amlodipine Besylate]     Patient did not have true allergy she had side effects with medication     Review of Systems  Constitutional: Negative.   Respiratory: Negative.   Cardiovascular: Negative.   Gastrointestinal: Negative.   Psychiatric/Behavioral: Negative.   All other systems reviewed and are negative.    Today's Vitals   11/01/20 1407  BP: 112/62  Pulse: 63  Temp: 98.2 F (36.8 C)  TempSrc: Oral  Weight: 184 lb 12.8 oz (83.8 kg)  Height: 5\' 6"  (1.676 m)   Body mass index is 29.83 kg/m.  Wt Readings from Last 3 Encounters:  11/01/20 184 lb 12.8 oz (83.8 kg)  07/02/20 184 lb 3.2 oz (83.6 kg)  06/29/20 184 lb 12.8 oz (83.8 kg)   BP Readings from Last 3 Encounters:  11/01/20 112/62  07/02/20 139/85  06/29/20 120/75    Objective:  Physical Exam Vitals and nursing note reviewed.  Constitutional:      Appearance: Normal appearance.  HENT:     Head: Normocephalic and atraumatic.     Nose:     Comments: Masked     Mouth/Throat:     Comments: Masked  Cardiovascular:     Rate and Rhythm: Normal rate and regular rhythm.     Heart sounds: Normal heart sounds.  Pulmonary:     Effort: Pulmonary effort is normal.     Breath sounds: Normal breath sounds.  Skin:    General: Skin is warm.  Neurological:     General: No focal deficit present.     Mental Status: She is alert.  Psychiatric:        Mood and Affect: Mood normal.        Behavior: Behavior normal.         Assessment And Plan:     1. Female climacteric state  I went over her saliva test results in full detail. Her results were significant for nl estrone, low estradiol, nl estriol levels which resulted in a nl estrogen quotient. I would like to decrease dose of her estrogen patch. My goal is to transition her to using only vaginal/topical estriol. She reluctantly agrees. She is concerned her hot flashes may worsen. She had low progesterone levels which resulted in low PG/E2 ratio. Lastly, she had low dhea and nl testosterone levels as well.  Greater than 50% of face to face time was spent in counseling and coordination of care. I spent more than 48minutes with the patient. All questions were answered to her satisfaction. She will rto in 4 months for re-evaluation.   2. Essential hypertension, benign Comments: Chronic, well controlled. She is encouraged to follow low sodium diet. Med mgmt as per PCP.     Patient was given opportunity to ask questions. Patient verbalized understanding of the plan and was able to repeat key elements of the plan. All questions were answered to their satisfaction.   I, [name], MD, have reviewed all documentation for this visit. The documentation on 11/01/20 for the exam, diagnosis, procedures, and orders  are all accurate and complete.   IF YOU HAVE BEEN REFERRED TO A SPECIALIST, IT MAY TAKE 1-2 WEEKS TO SCHEDULE/PROCESS THE REFERRAL. IF YOU HAVE NOT HEARD FROM US/SPECIALIST IN TWO WEEKS, PLEASE GIVE [place] A CALL AT [phone].   THE PATIENT IS ENCOURAGED TO PRACTICE SOCIAL DISTANCING DUE TO THE COVID-19 PANDEMIC.  ,as directed by  [name], MD while in the presence of  [name], MD.

## 2020-11-04 ENCOUNTER — Other Ambulatory Visit: Payer: Self-pay | Admitting: Internal Medicine

## 2020-11-11 ENCOUNTER — Encounter: Payer: Self-pay | Admitting: Internal Medicine

## 2020-12-02 ENCOUNTER — Other Ambulatory Visit: Payer: Self-pay | Admitting: Internal Medicine

## 2020-12-24 ENCOUNTER — Encounter: Payer: Self-pay | Admitting: Nurse Practitioner

## 2020-12-24 ENCOUNTER — Ambulatory Visit: Payer: No Typology Code available for payment source | Admitting: Nurse Practitioner

## 2020-12-24 ENCOUNTER — Other Ambulatory Visit: Payer: Self-pay

## 2020-12-24 VITALS — BP 112/74 | HR 64 | Temp 97.5°F | Ht 66.0 in | Wt 182.0 lb

## 2020-12-24 DIAGNOSIS — I10 Essential (primary) hypertension: Secondary | ICD-10-CM | POA: Diagnosis not present

## 2020-12-24 DIAGNOSIS — F32A Depression, unspecified: Secondary | ICD-10-CM

## 2020-12-24 DIAGNOSIS — E785 Hyperlipidemia, unspecified: Secondary | ICD-10-CM | POA: Diagnosis not present

## 2020-12-24 DIAGNOSIS — R5383 Other fatigue: Secondary | ICD-10-CM

## 2020-12-24 DIAGNOSIS — F419 Anxiety disorder, unspecified: Secondary | ICD-10-CM

## 2020-12-24 MED ORDER — ESCITALOPRAM OXALATE 20 MG PO TABS: 20.0000 mg | ORAL_TABLET | Freq: Every day | ORAL | 0 refills | Status: DC

## 2020-12-24 MED ORDER — NIFEDIPINE ER OSMOTIC RELEASE 30 MG PO TB24: ORAL_TABLET | ORAL | 1 refills | Status: DC

## 2020-12-24 NOTE — Progress Notes (Signed)
Subjective:    Patient ID: [name], female    DOB: 06/13/1961, 60 y.o.   MRN: [id number]  Hypertension This is a chronic problem. Treatments tried: nifediipine. Compliance problems include exercise and diet (takes med every day,).   Patient denies chest pain/ischemic type pain or SOB. Denies visual changes, difficulty speaking or swallowing or numbness or weakness of the face, arms or legs.  No edema. Has been struggling more lately with some anxiety and depression.  Would like to increase her Lexapro dose.  Very rare use of Xanax.  Experiencing more fatigue.  Is due for her yearly labs and physical. Depression screen [facility] 2/9 12/24/2020 07/02/2020 11/21/2019 10/27/2019 07/28/2019  Decreased Interest 0 0 0 0 0  Down, Depressed, Hopeless 0 0 0 0 0  PHQ - 2 Score 0 0 0 0 0  Altered sleeping 1 1 1 1 2   Tired, decreased energy 1 1 1 3 1   Change in appetite 0 0 0 0 0  Feeling bad or failure about yourself  0 0 0 0 0  Trouble concentrating 2 1 1 3 3   Moving slowly or fidgety/restless 0 0 0 0 0  Suicidal thoughts 0 0 0 0 0  PHQ-9 Score 4 3 3 7 6   Difficult doing work/chores Not difficult at all Not difficult at all Somewhat difficult Not difficult at all Not difficult at all  Some recent data might be hidden   GAD 7 : Generalized Anxiety Score 12/24/2020 07/02/2020 05/30/2019 05/03/2018  Nervous, Anxious, on Edge 0 0 0 0  Control/stop worrying 0 0 0 0  Worry too much - different things 0 0 0 0  Trouble relaxing 2 0 0 0  Restless 0 0 0 0  Easily annoyed or irritable 0 0 0 0  Afraid - awful might happen 0 0 0 0  Total GAD 7 Score 2 0 0 0  Anxiety Difficulty Not difficult at all Not difficult at all - Not difficult at all         Objective:   Physical Exam NAD.  Alert, oriented.  Lungs clear.  Heart regular rate and rhythm.  Carotids no bruits or thrills.  Lower extremities no edema.  Calm affect.  Making good eye contact.  Dressed appropriately.  Thoughts logical coherent and  relevant. Today's Vitals   12/24/20 1446  BP: 112/74  Pulse: 64  Temp: (!) 97.5 F (36.4 C)  SpO2: 100%  Weight: 182 lb (82.6 kg)  Height: 5\' 6"  (1.676 m)   Body mass index is 29.38 kg/m.       Assessment & Plan:   Problem List Items Addressed This Visit       Cardiovascular and Mediastinum   Essential hypertension, benign - Primary   Relevant Medications   NIFEdipine (PROCARDIA-XL/NIFEDICAL-XL) 30 MG 24 hr tablet   Other Relevant Orders   Comprehensive metabolic panel     Other   Anxiety and depression   Relevant Medications   escitalopram (LEXAPRO) 20 MG tablet   Other Visit Diagnoses     Hyperlipidemia, unspecified hyperlipidemia type       Relevant Medications   NIFEdipine (PROCARDIA-XL/NIFEDICAL-XL) 30 MG 24 hr tablet   Other Relevant Orders   Lipid panel   Other fatigue       Relevant Orders   TSH   CBC with Differential/Platelet      Meds ordered this encounter  Medications   escitalopram (LEXAPRO) 20 MG tablet    Sig: Take 1  tablet (20 mg total) by mouth daily.    Dispense:  90 tablet    Refill:  0    Order Specific Question:   Supervising Provider    Answer:   Sallee Lange A [[id number]]   NIFEdipine (PROCARDIA-XL/NIFEDICAL-XL) 30 MG 24 hr tablet    Sig: TAKE (1) TABLET BY MOUTH ONCE DAILY for BP    Dispense:  90 tablet    Refill:  1    Order Specific Question:   Supervising Provider    Answer:   Sallee Lange A [[id number]]   Increase Lexapro to 20 mg daily.  Go back to 10 mg dose if any significant side effects. Discussed lifestyle factors affecting her blood pressure.  Encouraged regular activity and healthy diet. Routine labs ordered.  Recommend wellness exam this fall. Routine blood pressure checkup in 6 months.

## 2020-12-25 ENCOUNTER — Encounter: Payer: Self-pay | Admitting: Nurse Practitioner

## 2020-12-27 ENCOUNTER — Ambulatory Visit (INDEPENDENT_AMBULATORY_CARE_PROVIDER_SITE_OTHER): Payer: No Typology Code available for payment source | Admitting: Internal Medicine

## 2020-12-27 ENCOUNTER — Other Ambulatory Visit: Payer: Self-pay

## 2020-12-27 ENCOUNTER — Encounter: Payer: Self-pay | Admitting: Internal Medicine

## 2020-12-27 VITALS — BP 110/62 | HR 62 | Temp 97.9°F | Ht 66.0 in | Wt 183.0 lb

## 2020-12-27 DIAGNOSIS — E663 Overweight: Secondary | ICD-10-CM

## 2020-12-27 DIAGNOSIS — Z6829 Body mass index (BMI) 29.0-29.9, adult: Secondary | ICD-10-CM

## 2020-12-27 DIAGNOSIS — N951 Menopausal and female climacteric states: Secondary | ICD-10-CM | POA: Diagnosis not present

## 2020-12-27 MED ORDER — ESTRADIOL 0.0375 MG/24HR TD PTTW: MEDICATED_PATCH | TRANSDERMAL | 1 refills | Status: DC

## 2020-12-27 NOTE — Progress Notes (Signed)
I,Tianna Badgett,acting as a Education administrator for [name], MD.,have documented all relevant documentation on the behalf of [name], MD,as directed by  [name], MD while in the presence of [name], MD.  This visit occurred during the SARS-CoV-2 public health emergency.  Safety protocols were in place, including screening questions prior to the visit, additional usage of staff PPE, and extensive cleaning of exam room while observing appropriate contact time as indicated for disinfecting solutions.  Subjective:     Patient ID: [name] , female    DOB: 11/14/1960 , 60 y.o.   MRN: [id number]   Chief Complaint  Patient presents with   hormone therapy    HPI  She is here today for f/u BHRT.  She is currently taking Minivelle patch 0.375 twice weekly and progesterone SR 150mg  nightly except Sundays. She reports feeling fine, she is surprised that she has not been having hot flashes. She denies having vaginal dryness at this time.     Past Medical History:  Diagnosis Date   Current use of estrogen therapy 02/24/2015   Depressed 04/21/2015   Gastroesophageal reflux disease    Hair loss 02/24/2015   Hot flashes 02/24/2015   Hypertension      Family History  Problem Relation Age of Onset   Diabetes type II Mother    Coronary artery disease Mother    Diabetes type II Father    Coronary artery disease Father    Hypertension Father    Diabetes type II Sister    Kidney disease Sister    Diabetes Brother    Thyroid disease Sister    Hypertension Sister    Hypertension Sister    Hypertension Brother    Colon cancer Neg Hx    Colon polyps Neg Hx    Breast cancer Neg Hx    Ovarian cancer Neg Hx    Uterine cancer Neg Hx    Ulcerative colitis Neg Hx    Celiac disease Neg Hx    Crohn's disease Neg Hx      Current Outpatient Medications:    ALPRAZolam (XANAX) 0.5 MG tablet, TAKE (1/2) TO (1) TABLET TWICE DAILY AS NEEDED., Disp: 30 tablet, Rfl: 0   Ascorbic Acid  (VITAMIN C PO), Take by mouth., Disp: , Rfl:    BIOTIN PO, Take by mouth daily., Disp: , Rfl:    escitalopram (LEXAPRO) 20 MG tablet, Take 1 tablet (20 mg total) by mouth daily., Disp: 90 tablet, Rfl: 0   estradiol (VIVELLE-DOT) 0.0375 MG/24HR, PLACE 1 PATCH ONTO THE SKIN TWICE A WEEK, Disp: 24 patch, Rfl: 1   Multiple Vitamin (MULTIVITAMIN) capsule, Take 1 capsule by mouth daily., Disp: , Rfl:    NIFEdipine (PROCARDIA-XL/NIFEDICAL-XL) 30 MG 24 hr tablet, TAKE (1) TABLET BY MOUTH ONCE DAILY for BP, Disp: 90 tablet, Rfl: 1   OVER THE COUNTER MEDICATION, Vit d3 5,000 iu Omega 3 1,000 mg [facility] 1,000mg , Disp: , Rfl:    Probiotic Product (ALIGN) 4 MG CAPS, Take by mouth daily., Disp: , Rfl:    progesterone (PROMETRIUM) 100 MG capsule, Take 200 mg by mouth. Daily except sunday, Disp: , Rfl:    vitamin B-12 (CYANOCOBALAMIN) 1000 MCG tablet, Take 1,000 mcg by mouth daily., Disp: , Rfl:    Allergies  Allergen Reactions   Plaquenil [Hydroxychloroquine Sulfate]     Itching hives   Hydrocodone Itching   Lisinopril     Headache  Dizzy    Metronidazole Hives  Norvasc [Amlodipine Besylate]     Patient did not have true allergy she had side effects with medication     Review of Systems  Constitutional: Negative.   Respiratory: Negative.    Cardiovascular: Negative.   Gastrointestinal: Negative.   Neurological: Negative.     Today's Vitals   12/27/20 1417  BP: 110/62  Pulse: 62  Temp: 97.9 F (36.6 C)  TempSrc: Oral  Weight: 183 lb (83 kg)  Height: 5\' 6"  (1.676 m)   Body mass index is 29.54 kg/m.  Wt Readings from Last 3 Encounters:  12/27/20 183 lb (83 kg)  12/24/20 182 lb (82.6 kg)  11/01/20 184 lb 12.8 oz (83.8 kg)    Objective:  Physical Exam Vitals and nursing note reviewed.  Constitutional:      Appearance: Normal appearance. She is obese.  HENT:     Head: Normocephalic and atraumatic.     Nose:     Comments: Masked     Mouth/Throat:     Comments: Masked   Cardiovascular:     Rate and Rhythm: Normal rate and regular rhythm.     Heart sounds: Normal heart sounds.  Pulmonary:     Effort: Pulmonary effort is normal.     Breath sounds: Normal breath sounds.  Skin:    General: Skin is warm.  Neurological:     General: No focal deficit present.     Mental Status: She is alert.  Psychiatric:        Mood and Affect: Mood normal.        Behavior: Behavior normal.        Assessment And Plan:     1. Female climacteric state Comments: We agree to c/w Estradiol patch at current dose 0.0375 twice weekly for now. She agrees to f/u in 3-4 months for re-evaluation.   2. Overweight with body mass index (BMI) of 29 to 29.9 in adult  She is encouraged to strive for BMI less than 27 to decrease cardiac risk. Advised to aim for at least 150 minutes of exercise per week.  Patient was given opportunity to ask questions. Patient verbalized understanding of the plan and was able to repeat key elements of the plan. All questions were answered to their satisfaction.   I, [name], MD, have reviewed all documentation for this visit. The documentation on 12/27/20 for the exam, diagnosis, procedures, and orders are all accurate and complete.   IF YOU HAVE BEEN REFERRED TO A SPECIALIST, IT MAY TAKE 1-2 WEEKS TO SCHEDULE/PROCESS THE REFERRAL. IF YOU HAVE NOT HEARD FROM US/SPECIALIST IN TWO WEEKS, PLEASE GIVE [place] A CALL AT [phone].   THE PATIENT IS ENCOURAGED TO PRACTICE SOCIAL DISTANCING DUE TO THE COVID-19 PANDEMIC.

## 2020-12-27 NOTE — Patient Instructions (Signed)

## 2021-01-03 ENCOUNTER — Encounter: Payer: Self-pay | Admitting: Internal Medicine

## 2021-01-11 ENCOUNTER — Encounter: Payer: Self-pay | Admitting: Internal Medicine

## 2021-01-31 ENCOUNTER — Telehealth: Payer: Self-pay | Admitting: Family Medicine

## 2021-01-31 NOTE — Telephone Encounter (Signed)
Pt has skin tag on right side of neck. Pt would like it removed. Wanting to know if PCP could remove it or should she see dermatology. Pt states the skin tag doesn't bother her but "its ugly". No pain or anything with tag. Please advise. Thank you Pt prefers my chart message be sent to her with reply.

## 2021-01-31 NOTE — Telephone Encounter (Signed)
Dermatology, please send her a MyChart note thank you

## 2021-02-01 DIAGNOSIS — L918 Other hypertrophic disorders of the skin: Secondary | ICD-10-CM

## 2021-02-01 NOTE — Telephone Encounter (Signed)
My Chart message sent to patient as requested.

## 2021-02-03 ENCOUNTER — Telehealth: Payer: Self-pay | Admitting: Family Medicine

## 2021-02-19 ENCOUNTER — Other Ambulatory Visit: Payer: Self-pay | Admitting: Nurse Practitioner

## 2021-02-23 ENCOUNTER — Telehealth: Payer: Self-pay | Admitting: Family Medicine

## 2021-02-23 ENCOUNTER — Other Ambulatory Visit (HOSPITAL_COMMUNITY): Payer: Self-pay | Admitting: Family Medicine

## 2021-02-23 DIAGNOSIS — Z1231 Encounter for screening mammogram for malignant neoplasm of breast: Secondary | ICD-10-CM

## 2021-02-23 NOTE — Telephone Encounter (Signed)
Labs ordered 12/24/20- Patient notified and verbalized understanding.

## 2021-02-23 NOTE — Telephone Encounter (Signed)
Patient has physical 10/7 and needing labs

## 2021-03-07 ENCOUNTER — Other Ambulatory Visit: Payer: Self-pay

## 2021-03-07 ENCOUNTER — Ambulatory Visit (HOSPITAL_COMMUNITY)
Admission: RE | Admit: 2021-03-07 | Discharge: 2021-03-07 | Disposition: A | Discharge status: Home / Self Care | Payer: No Typology Code available for payment source | Source: Ambulatory Visit | Attending: Family Medicine | Admitting: Family Medicine

## 2021-03-07 DIAGNOSIS — Z1231 Encounter for screening mammogram for malignant neoplasm of breast: Secondary | ICD-10-CM | POA: Insufficient documentation

## 2021-03-15 LAB — LIPID PANEL
Chol/HDL Ratio: 3.2 ratio (ref 0.0–4.4)
Cholesterol, Total: 211 mg/dL — ABNORMAL HIGH (ref 100–199)
HDL: 65 mg/dL (ref >39)
LDL Chol Calc (NIH): 129 mg/dL — ABNORMAL HIGH (ref 0–99)
Triglycerides: 97 mg/dL (ref 0–149)
VLDL Cholesterol Cal: 17 mg/dL (ref 5–40)

## 2021-03-15 LAB — CBC WITH DIFFERENTIAL/PLATELET
Basophils Absolute: 0.1 10*3/uL (ref 0.0–0.2)
Basos: 1 %
EOS (ABSOLUTE): 0.1 10*3/uL (ref 0.0–0.4)
Eos: 1 %
Hematocrit: 42.5 % (ref 34.0–46.6)
Hemoglobin: 13.5 g/dL (ref 11.1–15.9)
Immature Grans (Abs): 0 10*3/uL (ref 0.0–0.1)
Immature Granulocytes: 0 %
Lymphocytes Absolute: 1.9 10*3/uL (ref 0.7–3.1)
Lymphs: 30 %
MCH: 28.1 pg (ref 26.6–33.0)
MCHC: 31.8 g/dL (ref 31.5–35.7)
MCV: 88 fL (ref 79–97)
Monocytes Absolute: 0.5 10*3/uL (ref 0.1–0.9)
Monocytes: 8 %
Neutrophils Absolute: 3.9 10*3/uL (ref 1.4–7.0)
Neutrophils: 60 %
Platelets: 279 10*3/uL (ref 150–450)
RBC: 4.81 x10E6/uL (ref 3.77–5.28)
RDW: 14.5 % (ref 11.7–15.4)
WBC: 6.4 10*3/uL (ref 3.4–10.8)

## 2021-03-15 LAB — COMPREHENSIVE METABOLIC PANEL
ALT: 13 IU/L (ref 0–32)
AST: 13 IU/L (ref 0–40)
Albumin/Globulin Ratio: 1.7 (ref 1.2–2.2)
Albumin: 4.3 g/dL (ref 3.8–4.9)
Alkaline Phosphatase: 102 IU/L (ref 44–121)
BUN/Creatinine Ratio: 16 (ref 12–28)
BUN: 14 mg/dL (ref 8–27)
Bilirubin Total: 0.3 mg/dL (ref 0.0–1.2)
CO2: 22 mmol/L (ref 20–29)
Calcium: 9 mg/dL (ref 8.7–10.3)
Chloride: 105 mmol/L (ref 96–106)
Creatinine, Ser: 0.85 mg/dL (ref 0.57–1.00)
Globulin, Total: 2.5 g/dL (ref 1.5–4.5)
Glucose: 97 mg/dL (ref 70–99)
Potassium: 4.2 mmol/L (ref 3.5–5.2)
Sodium: 140 mmol/L (ref 134–144)
Total Protein: 6.8 g/dL (ref 6.0–8.5)
eGFR: 78 mL/min/{1.73_m2} (ref >59)

## 2021-03-15 LAB — TSH: TSH: 1.81 u[IU]/mL (ref 0.450–4.500)

## 2021-03-25 ENCOUNTER — Other Ambulatory Visit: Payer: Self-pay

## 2021-03-25 ENCOUNTER — Ambulatory Visit (INDEPENDENT_AMBULATORY_CARE_PROVIDER_SITE_OTHER): Payer: No Typology Code available for payment source | Admitting: Nurse Practitioner

## 2021-03-25 VITALS — BP 133/84 | Ht 66.0 in | Wt 184.6 lb

## 2021-03-25 DIAGNOSIS — F419 Anxiety disorder, unspecified: Secondary | ICD-10-CM | POA: Diagnosis not present

## 2021-03-25 DIAGNOSIS — E785 Hyperlipidemia, unspecified: Secondary | ICD-10-CM

## 2021-03-25 DIAGNOSIS — Z01411 Encounter for gynecological examination (general) (routine) with abnormal findings: Secondary | ICD-10-CM

## 2021-03-25 DIAGNOSIS — R5382 Chronic fatigue, unspecified: Secondary | ICD-10-CM

## 2021-03-25 DIAGNOSIS — Z01419 Encounter for gynecological examination (general) (routine) without abnormal findings: Secondary | ICD-10-CM

## 2021-03-25 DIAGNOSIS — Z634 Disappearance and death of family member: Secondary | ICD-10-CM

## 2021-03-25 DIAGNOSIS — F32A Depression, unspecified: Secondary | ICD-10-CM

## 2021-03-25 NOTE — Progress Notes (Signed)
Subjective:    Patient ID: [name], female    DOB: [birth year], 60 y.o.   MRN: [id number]  HPI Patient presents for physical today. She has no major concerns just feeling more fatigued than usual. She is on hormone replacement therapy via patches. She denies any recent falls or accidents within the past 6 months. She says she does not feel depressed and feels health is good. Lost her sister back in January. Feels like she "has not dealt with this". Denies tobacco and alcohol use. Is conscious of diet but could do better. Likes to walk when sciatic nerve is not flared. Sexually active with husband. No concerns for STI screening. Denies vaginal bleeding/pain.  Will receive flu shot and shingles at end of month. UTD on dental and eye exam. Had mammogram last month which was normal. Last Colonoscopy 2013 and was normal. Takes a daily probiotic and has no issues with constipation or diarrhea. Family history of coronary artery disease both parents. Denies any headaches, blurred vision, SOB, chest pain.  Depression screen PHQ 2/9 03/25/2021  Decreased Interest 0  Down, Depressed, Hopeless 0  PHQ - 2 Score 0  Altered sleeping -  Tired, decreased energy -  Change in appetite -  Feeling bad or failure about yourself  -  Trouble concentrating -  Moving slowly or fidgety/restless -  Suicidal thoughts -  PHQ-9 Score -  Difficult doing work/chores -  Some recent data might be hidden    Review of Systems  Constitutional:  Positive for fatigue. Negative for chills and fever.  HENT:  Negative for sore throat and trouble swallowing.   Respiratory:  Negative for apnea, cough, chest tightness and shortness of breath.   Cardiovascular:  Negative for chest pain.  Gastrointestinal:  Negative for abdominal pain, blood in stool, constipation, diarrhea, nausea and vomiting.  Genitourinary:  Positive for enuresis. Negative for difficulty urinating, dysuria, frequency, genital sores, pelvic pain, urgency,  vaginal bleeding and vaginal discharge.       Minimal stress incontinence.   Musculoskeletal:  Positive for back pain.       Has intermittent Sciatic nerve pain which has flared up this week.    Neurological:  Negative for dizziness, numbness and headaches.  Psychiatric/Behavioral:  Negative for suicidal ideas.       Objective:   Physical Exam Vitals and nursing note reviewed. Exam conducted with a chaperone present.  Constitutional:      General: She is not in acute distress.    Appearance: Normal appearance.  Neck:     Comments: Thyroid non-tender, no enlargement or goiter noted. No masses felt on palpation.  Cardiovascular:     Rate and Rhythm: Normal rate and regular rhythm.     Heart sounds: Normal heart sounds.  Pulmonary:     Effort: Pulmonary effort is normal.     Breath sounds: Normal breath sounds.  Chest:  Breasts:    Right: Normal. No swelling, inverted nipple, mass, skin change or tenderness.     Left: Normal. No swelling, inverted nipple, mass, skin change or tenderness.  Abdominal:     Palpations: Abdomen is soft. There is no mass.     Tenderness: There is no abdominal tenderness. There is no guarding or rebound.  Genitourinary:    General: Normal vulva.     Exam position: Lithotomy position.     Labia:        Right: No rash, tenderness or lesion.        Left:  No rash, tenderness or lesion.      Comments: External GU: no rashes or lesions. Vagina: slightly pale, no lesions or discharge. Bimanual exam: no tenderness or obvious masses.  Musculoskeletal:     Cervical back: Neck supple.     Comments: Gets off and on exam table without assistance.   Lymphadenopathy:     Cervical: No cervical adenopathy.     Upper Body:     Right upper body: No supraclavicular, axillary or pectoral adenopathy.     Left upper body: No supraclavicular, axillary or pectoral adenopathy.  Skin:    General: Skin is warm and dry.  Neurological:     Mental Status: She is alert and  oriented to person, place, and time.  Psychiatric:        Mood and Affect: Mood normal.        Behavior: Behavior normal.        Thought Content: Thought content normal.        Judgment: Judgment normal.   .. Vitals:   03/25/21 0834  BP: 133/84  Height: '5\' 6"'  (1.676 m)  Weight: 184 lb 9.6 oz (83.7 kg)  BMI (Calculated): 29.81   .Marland Kitchen Recent Results (from the past 2160 hour(s))  Lipid panel     Status: Abnormal   Collection Time: 03/14/21 10:03 AM  Result Value Ref Range   Cholesterol, Total 211 (H) 100 - 199 mg/dL   Triglycerides 97 0 - 149 mg/dL   HDL 65 >39 mg/dL   VLDL Cholesterol Cal 17 5 - 40 mg/dL   LDL Chol Calc (NIH) 129 (H) 0 - 99 mg/dL   Chol/HDL Ratio 3.2 0.0 - 4.4 ratio    Comment:                                   T. Chol/HDL Ratio                                             Men  Women                               1/2 Avg.Risk  3.4    3.3                                   Avg.Risk  5.0    4.4                                2X Avg.Risk  9.6    7.1                                3X Avg.Risk 23.4   11.0   Comprehensive metabolic panel     Status: None   Collection Time: 03/14/21 10:03 AM  Result Value Ref Range   Glucose 97 70 - 99 mg/dL    Comment:               **Please note reference interval change**   BUN 14 8 - 27 mg/dL   Creatinine, Ser 0.85 0.57 -  1.00 mg/dL   eGFR 78 >59 mL/min/1.73   BUN/Creatinine Ratio 16 12 - 28   Sodium 140 134 - 144 mmol/L   Potassium 4.2 3.5 - 5.2 mmol/L   Chloride 105 96 - 106 mmol/L   CO2 22 20 - 29 mmol/L   Calcium 9.0 8.7 - 10.3 mg/dL   Total Protein 6.8 6.0 - 8.5 g/dL   Albumin 4.3 3.8 - 4.9 g/dL   Globulin, Total 2.5 1.5 - 4.5 g/dL   Albumin/Globulin Ratio 1.7 1.2 - 2.2   Bilirubin Total 0.3 0.0 - 1.2 mg/dL   Alkaline Phosphatase 102 44 - 121 IU/L   AST 13 0 - 40 IU/L   ALT 13 0 - 32 IU/L  TSH     Status: None   Collection Time: 03/14/21 10:03 AM  Result Value Ref Range   TSH 1.810 0.450 - 4.500 uIU/mL  CBC with  Differential/Platelet     Status: None   Collection Time: 03/14/21 10:03 AM  Result Value Ref Range   WBC 6.4 3.4 - 10.8 x10E3/uL   RBC 4.81 3.77 - 5.28 x10E6/uL   Hemoglobin 13.5 11.1 - 15.9 g/dL   Hematocrit 42.5 34.0 - 46.6 %   MCV 88 79 - 97 fL   MCH 28.1 26.6 - 33.0 pg   MCHC 31.8 31.5 - 35.7 g/dL   RDW 14.5 11.7 - 15.4 %   Platelets 279 150 - 450 x10E3/uL   Neutrophils 60 Not Estab. %   Lymphs 30 Not Estab. %   Monocytes 8 Not Estab. %   Eos 1 Not Estab. %   Basos 1 Not Estab. %   Neutrophils Absolute 3.9 1.4 - 7.0 x10E3/uL   Lymphocytes Absolute 1.9 0.7 - 3.1 x10E3/uL   Monocytes Absolute 0.5 0.1 - 0.9 x10E3/uL   EOS (ABSOLUTE) 0.1 0.0 - 0.4 x10E3/uL   Basophils Absolute 0.1 0.0 - 0.2 x10E3/uL   Immature Granulocytes 0 Not Estab. %   Immature Grans (Abs) 0.0 0.0 - 0.1 x10E3/uL   The 10-year ASCVD risk score (Arnett DK, et al., 2019) is: 7.2%   Values used to calculate the score:     Age: 36 years     Sex: Female     Is Non-Hispanic African American: Yes     Diabetic: No     Tobacco smoker: No     Systolic Blood Pressure: 088 mmHg     Is BP treated: Yes     HDL Cholesterol: 65 mg/dL     Total Cholesterol: 211 mg/dL        Assessment & Plan:   Problem List Items Addressed This Visit       Other   Anxiety and depression   Other Visit Diagnoses     Well woman exam    -  Primary   Fatigue due to depression       Hyperlipidemia, unspecified hyperlipidemia type       Bereavement            Plan:  -Discussed and reviewed ASCVD risk assessment and reviewed risk factors with patient. Recommended low dose statin, patient defers at this time and wanted to try non-pharmacological therapy first and re-assess at next follow-up.  -Suggested bereavement counseling due to recent loss of sister which may be a major factor in her fatigue.   -Discussed stretching and back exercises to help relieve back pain. Defers further interventions at this time.  Return in  about 6 months (around 09/23/2021).

## 2021-03-25 NOTE — Progress Notes (Signed)
   Subjective:    Patient ID: [name], female    DOB: Aug 07, 1960, 60 y.o.   MRN: [id number]  HPI The patient comes in today for a wellness visit.    A review of their health history was completed.  A review of medications was also completed.  Any needed refills; yes  Eating habits: trying to eat healthy  Falls/  MVA accidents in past few months: no  Regular exercise: try walking  Specialist pt sees on regular basis: Dr Isaac Laud specialist  Preventative health issues were discussed.   Additional concerns: why so tired all the time   Review of Systems     Objective:   Physical Exam   The 10-year ASCVD risk score (Arnett DK, et al., 2019) is: 7.2%   Values used to calculate the score:     Age: 60 years     Sex: Female     Is Non-Hispanic African American: Yes     Diabetic: No     Tobacco smoker: No     Systolic Blood Pressure: 169 mmHg     Is BP treated: Yes     HDL Cholesterol: 65 mg/dL     Total Cholesterol: 211 mg/dL      Assessment & Plan:

## 2021-03-26 ENCOUNTER — Encounter: Payer: Self-pay | Admitting: Nurse Practitioner

## 2021-03-28 ENCOUNTER — Encounter: Payer: Self-pay | Admitting: Internal Medicine

## 2021-03-28 ENCOUNTER — Ambulatory Visit (INDEPENDENT_AMBULATORY_CARE_PROVIDER_SITE_OTHER): Payer: No Typology Code available for payment source | Admitting: Internal Medicine

## 2021-03-28 ENCOUNTER — Other Ambulatory Visit: Payer: Self-pay

## 2021-03-28 ENCOUNTER — Other Ambulatory Visit: Payer: Self-pay | Admitting: Nurse Practitioner

## 2021-03-28 VITALS — BP 130/74 | HR 66 | Temp 98.5°F | Ht 66.0 in | Wt 185.4 lb

## 2021-03-28 DIAGNOSIS — Z6829 Body mass index (BMI) 29.0-29.9, adult: Secondary | ICD-10-CM

## 2021-03-28 DIAGNOSIS — F5101 Primary insomnia: Secondary | ICD-10-CM

## 2021-03-28 DIAGNOSIS — N951 Menopausal and female climacteric states: Secondary | ICD-10-CM

## 2021-03-28 DIAGNOSIS — R5383 Other fatigue: Secondary | ICD-10-CM

## 2021-03-28 DIAGNOSIS — E663 Overweight: Secondary | ICD-10-CM

## 2021-03-28 NOTE — Patient Instructions (Signed)

## 2021-03-28 NOTE — Progress Notes (Signed)
I,Tianna Badgett,acting as a Education administrator for [name], MD.,have documented all relevant documentation on the behalf of [name], MD,as directed by  [name], MD while in the presence of [name], MD.  This visit occurred during the SARS-CoV-2 public health emergency.  Safety protocols were in place, including screening questions prior to the visit, additional usage of staff PPE, and extensive cleaning of exam room while observing appropriate contact time as indicated for disinfecting solutions.  Subjective:     Patient ID: [name] , female    DOB: 1961-02-10 , 60 y.o.   MRN: [id number]   Chief Complaint  Patient presents with   BHRT    HPI  She is here today for f/u BHRT. She reports compliance with her BHRT - uses Vivelle Dot and progesterone SR capsules daily. She has no issues with this regimen. Recently seen by PCP for CPE, mentioned worsening fatigue. States all labwork was normal.   After further questioning, she does admit that she has issues with sleeping. PCP prescribes xanax to use prn for insomnia.   Insomnia Primary symptoms: sleep disturbance, frequent awakening, premature morning awakening, malaise/fatigue.   The current episode started more than one year. The onset quality is gradual. The problem occurs nightly. The problem has been gradually worsening since onset. The symptoms are aggravated by work stress and family issues. How many beverages per day that contain caffeine: 2-3.  Types of beverages you drink: tea and coffee. The symptoms are relieved by quiet environment. Past treatments include medication. The treatment provided mild relief. Typical bedtime:  10-11 P.M..  How long after going to bed to you fall asleep: over an hour.   PMH includes: family stress or anxiety.  Prior diagnostic workup includes:  Polysomnogram and blood work.    Past Medical History:  Diagnosis Date   Current use of estrogen therapy 02/24/2015   Depressed 04/21/2015    Gastroesophageal reflux disease    Hair loss 02/24/2015   Hot flashes 02/24/2015   Hypertension      Family History  Problem Relation Age of Onset   Diabetes type II Mother    Coronary artery disease Mother    Diabetes type II Father    Coronary artery disease Father    Hypertension Father    Diabetes type II Sister    Kidney disease Sister    Diabetes Brother    Thyroid disease Sister    Hypertension Sister    Hypertension Sister    Hypertension Brother    Colon cancer Neg Hx    Colon polyps Neg Hx    Breast cancer Neg Hx    Ovarian cancer Neg Hx    Uterine cancer Neg Hx    Ulcerative colitis Neg Hx    Celiac disease Neg Hx    Crohn's disease Neg Hx      Current Outpatient Medications:    ALPRAZolam (XANAX) 0.5 MG tablet, TAKE (1/2) TO (1) TABLET TWICE DAILY AS NEEDED., Disp: 30 tablet, Rfl: 0   Ascorbic Acid (VITAMIN C PO), Take by mouth., Disp: , Rfl:    BIOTIN PO, Take by mouth daily., Disp: , Rfl:    escitalopram (LEXAPRO) 20 MG tablet, Take 1 tablet (20 mg total) by mouth daily., Disp: 90 tablet, Rfl: 0   estradiol (VIVELLE-DOT) 0.0375 MG/24HR, PLACE 1 PATCH ONTO THE SKIN TWICE A WEEK, Disp: 24 patch, Rfl: 1   influenza vac split quadrivalent PF (FLUARIX) 0.5 ML injection, Inject into  the muscle., Disp: 0.5 mL, Rfl: 0   Multiple Vitamin (MULTIVITAMIN) capsule, Take 1 capsule by mouth daily., Disp: , Rfl:    NIFEdipine (PROCARDIA-XL/NIFEDICAL-XL) 30 MG 24 hr tablet, TAKE (1) TABLET BY MOUTH ONCE DAILY for BP, Disp: 90 tablet, Rfl: 1   OVER THE COUNTER MEDICATION, Vit d3 5,000 iu Omega 3 1,000 mg [facility]sm joint health 1,000mg , Disp: , Rfl:    Probiotic Product (ALIGN) 4 MG CAPS, Take by mouth daily., Disp: , Rfl:    progesterone (PROMETRIUM) 100 MG capsule, Take 200 mg by mouth. Daily except sunday, Disp: , Rfl:    vitamin B-12 (CYANOCOBALAMIN) 1000 MCG tablet, Take 1,000 mcg by mouth daily., Disp: , Rfl:    Allergies  Allergen Reactions   Plaquenil [Hydroxychloroquine  Sulfate]     Itching hives   Hydrocodone Itching   Lisinopril     Headache  Dizzy    Metronidazole Hives   Norvasc [Amlodipine Besylate]     Patient did not have true allergy she had side effects with medication     Review of Systems  Constitutional:  Positive for fatigue and malaise/fatigue.  Respiratory: Negative.    Cardiovascular: Negative.   Gastrointestinal: Negative.   Neurological: Negative.   Psychiatric/Behavioral:  Positive for sleep disturbance. The patient has insomnia.     Today's Vitals   03/28/21 1428  BP: 130/74  Pulse: 66  Temp: 98.5 F (36.9 C)  TempSrc: Oral  Weight: 185 lb 6.4 oz (84.1 kg)  Height: 5\' 6"  (1.676 m)   Body mass index is 29.92 kg/m.  Wt Readings from Last 3 Encounters:  03/28/21 185 lb 6.4 oz (84.1 kg)  03/25/21 184 lb 9.6 oz (83.7 kg)  12/27/20 183 lb (83 kg)    Objective:  Physical Exam Vitals and nursing note reviewed.  Constitutional:      Appearance: Normal appearance.  HENT:     Head: Normocephalic and atraumatic.     Nose:     Comments: Masked     Mouth/Throat:     Comments: Masked  Eyes:     Extraocular Movements: Extraocular movements intact.  Cardiovascular:     Rate and Rhythm: Normal rate and regular rhythm.     Heart sounds: Normal heart sounds.  Pulmonary:     Effort: Pulmonary effort is normal.     Breath sounds: Normal breath sounds.  Musculoskeletal:     Cervical back: Normal range of motion.  Skin:    General: Skin is warm.  Neurological:     General: No focal deficit present.     Mental Status: She is alert.  Psychiatric:        Mood and Affect: Mood normal.        Behavior: Behavior normal.        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 She will f/u in 4 months for re-evaluation.  - Estradiol - Progesterone  2. Fatigue, unspecified type Comments: Chronic. I will check labs as  listed below. Encouraged to stay well hydrated.  - Vitamin B12  3. Primary insomnia Comments: She was given samles of Belsomra 15mg  and 20mg  to take nightly prn. Advised to have good bedtime hygiene as well. She is advised to call office to give me an update on her symptoms.   4. Overweight with body mass index (BMI) of 29 to 29.9 in adult  She is encouraged to strive  for BMI less than 26 to decrease cardiac risk. Advised to aim for at least 150 minutes of exercise per week.  Patient was given opportunity to ask questions. Patient verbalized understanding of the plan and was able to repeat key elements of the plan. All questions were answered to their satisfaction.   I, [name], MD, have reviewed all documentation for this visit. The documentation on 03/28/21 for the exam, diagnosis, procedures, and orders are all accurate and complete.   IF YOU HAVE BEEN REFERRED TO A SPECIALIST, IT MAY TAKE 1-2 WEEKS TO SCHEDULE/PROCESS THE REFERRAL. IF YOU HAVE NOT HEARD FROM US/SPECIALIST IN TWO WEEKS, PLEASE GIVE [place] A CALL AT [phone].   THE PATIENT IS ENCOURAGED TO PRACTICE SOCIAL DISTANCING DUE TO THE COVID-19 PANDEMIC.

## 2021-03-30 MED ORDER — ESCITALOPRAM OXALATE 20 MG PO TABS: 20.0000 mg | ORAL_TABLET | Freq: Every day | ORAL | 0 refills | Status: DC

## 2021-03-31 LAB — PROGESTERONE: Progesterone: <0.1 ng/mL

## 2021-03-31 LAB — ESTRADIOL: Estradiol: 6.6 pg/mL

## 2021-03-31 LAB — VITAMIN B12: Vitamin B-12: 616 pg/mL (ref 232–1245)

## 2021-04-01 ENCOUNTER — Encounter: Payer: Self-pay | Admitting: Internal Medicine

## 2021-04-05 ENCOUNTER — Other Ambulatory Visit (HOSPITAL_COMMUNITY): Payer: Self-pay

## 2021-04-05 MED ORDER — INFLUENZA VAC SPLIT QUAD 0.5 ML IM SUSY
PREFILLED_SYRINGE | INTRAMUSCULAR | 0 refills | Status: DC
  Filled 2021-04-05: qty 0.5, 1d supply, fill #0

## 2021-05-25 ENCOUNTER — Encounter: Payer: Self-pay | Admitting: Internal Medicine

## 2021-05-25 ENCOUNTER — Other Ambulatory Visit: Payer: Self-pay | Admitting: Internal Medicine

## 2021-05-26 ENCOUNTER — Other Ambulatory Visit: Payer: Self-pay | Admitting: Internal Medicine

## 2021-05-26 MED ORDER — ESTRADIOL 0.025 MG/24HR TD PTTW: 1.0000 | MEDICATED_PATCH | TRANSDERMAL | 2 refills | Status: DC

## 2021-06-07 ENCOUNTER — Encounter: Payer: Self-pay | Admitting: Family Medicine

## 2021-06-07 NOTE — Telephone Encounter (Signed)
Nurses-most likely this is a viral process it could take 7 to 10 days to turn the corner and get better  It is reasonable to try cold medicines and cough medicines as long as they do not have anything in it that would cause her blood pressure to go up.  Typically I would use Robitussin-DM  If her symptoms become progressively worse or bothersome I would recommend office visit to be seen

## 2021-06-08 ENCOUNTER — Other Ambulatory Visit: Payer: Self-pay

## 2021-06-08 ENCOUNTER — Ambulatory Visit: Payer: No Typology Code available for payment source | Admitting: Nurse Practitioner

## 2021-06-08 ENCOUNTER — Encounter: Payer: Self-pay | Admitting: Nurse Practitioner

## 2021-06-08 VITALS — BP 150/80 | HR 69 | Temp 98.8°F | Wt 184.4 lb

## 2021-06-08 DIAGNOSIS — H6123 Impacted cerumen, bilateral: Secondary | ICD-10-CM

## 2021-06-08 DIAGNOSIS — G4485 Primary stabbing headache: Secondary | ICD-10-CM

## 2021-06-08 DIAGNOSIS — R051 Acute cough: Secondary | ICD-10-CM | POA: Diagnosis not present

## 2021-06-08 MED ORDER — IBUPROFEN 600 MG PO TABS: 600.0000 mg | ORAL_TABLET | Freq: Two times a day (BID) | ORAL | 0 refills | Status: AC | PRN

## 2021-06-08 NOTE — Progress Notes (Signed)
Subjective:    Patient ID: [name], female    DOB: September 09, 1960, 60 y.o.   MRN: [id number]  HPI  Patient report to clinic with headache, non productive cough, ear pain, body aches, N/V x2 days. Patient reports fever x1 day of 101.1. Patient reports no fevers today. Patient describes headache as intermittent sharp shooting/stabbing headache that originates at BIL temples and radiates to jaw. Patient denies changes to her vision, chest congestions, SOB, or difficulty breathing. Patient has used robitussin with some relief and tylenol with some relief.   Review of Systems  Constitutional:  Positive for chills, fatigue and fever.  HENT:  Positive for congestion, ear pain and sore throat. Negative for ear discharge, hearing loss, postnasal drip, rhinorrhea, sinus pressure, sinus pain, trouble swallowing and voice change.   Eyes:  Negative for pain, discharge and visual disturbance.  Respiratory:  Positive for cough. Negative for apnea, choking, chest tightness, shortness of breath, wheezing and stridor.   Cardiovascular:  Negative for chest pain, palpitations and leg swelling.  Gastrointestinal:  Positive for nausea and vomiting. Negative for constipation and diarrhea.  Skin:  Negative for rash.  Neurological:  Positive for headaches. Negative for dizziness, tremors, syncope, facial asymmetry, speech difficulty and weakness.      Objective:   Physical Exam Constitutional:      General: She is not in acute distress.    Appearance: Normal appearance. She is normal weight. She is not ill-appearing or toxic-appearing.  HENT:     Head: Normocephalic and atraumatic. No Battle's sign, abrasion, contusion, masses or laceration. Hair is abnormal.     Jaw: There is normal jaw occlusion. Tenderness present. No trismus, swelling, pain on movement or malocclusion.      Comments: Hx of alopecia    Right Ear: There is impacted cerumen.     Left Ear: There is impacted cerumen.     Nose: Nose  normal.     Mouth/Throat:     Mouth: Mucous membranes are moist.  Eyes:     General: No visual field deficit.    Conjunctiva/sclera: Conjunctivae normal.     Pupils: Pupils are equal, round, and reactive to light.  Neck:     Vascular: No carotid bruit.  Cardiovascular:     Rate and Rhythm: Normal rate and regular rhythm.     Pulses: Normal pulses.     Heart sounds: Normal heart sounds. No murmur heard. Pulmonary:     Effort: Pulmonary effort is normal. No respiratory distress.     Breath sounds: Normal breath sounds. No stridor. No wheezing, rhonchi or rales.  Chest:     Chest wall: No tenderness.  Musculoskeletal:        General: Normal range of motion.     Cervical back: Normal range of motion and neck supple. Tenderness present. No rigidity.  Lymphadenopathy:     Cervical: No cervical adenopathy.  Skin:    General: Skin is warm.  Neurological:     General: No focal deficit present.     Mental Status: She is alert and oriented to person, place, and time.     Cranial Nerves: Cranial nerves 2-12 are intact. No cranial nerve deficit, dysarthria or facial asymmetry.     Sensory: Sensation is intact. No sensory deficit.     Motor: No weakness.  Psychiatric:        Mood and Affect: Mood normal.        Behavior: Behavior normal.     Comments: Appeared  to be in pain r/t headache          Assessment & Plan:  1. Primary stabbing headache - Etiology seems to be r/t to viral cause. However also considering GCA and neuralgia in the presence of a viral illness. - CBC with Differential - C-reactive protein - Sed Rate (ESR) - CMP14+EGFR - COVID-19, Flu A+B and RSV - May take Ibuprofen 626m BID PRN for pain. Be sure to eat prior to taking ibuprofen and space out ibuprofen from lexapro to prevent GI issues - Return to clinic 7 days or sooner if pain is not better or is worse - Go to ED or urgent care if you develop SOB, difficulty breathing, or worse headache of your life.  -  Drink plenty water  2. Acute cough - Seems to be viral - Continue with robitussin as needed  3. Impacted cerumen of both ears - Will address ears at next visit.  - Use OTC debrox to clean ears.

## 2021-06-08 NOTE — Patient Instructions (Signed)
If symptoms have not improved in ~ 7 days, come back to clinic to be re-evaluated. If you experience the worse headache of your life, go to ED or Urgent care. If you develop SOB or difficulty breathing go to ED or Urgent Care.

## 2021-06-09 ENCOUNTER — Encounter: Payer: Self-pay | Admitting: Nurse Practitioner

## 2021-06-09 LAB — CBC WITH DIFFERENTIAL/PLATELET
Basophils Absolute: 0 10*3/uL (ref 0.0–0.2)
Basos: 1 %
EOS (ABSOLUTE): 0 10*3/uL (ref 0.0–0.4)
Eos: 1 %
Hematocrit: 38.1 % (ref 34.0–46.6)
Hemoglobin: 12.7 g/dL (ref 11.1–15.9)
Immature Grans (Abs): 0 10*3/uL (ref 0.0–0.1)
Immature Granulocytes: 0 %
Lymphocytes Absolute: 1 10*3/uL (ref 0.7–3.1)
Lymphs: 26 %
MCH: 28.8 pg (ref 26.6–33.0)
MCHC: 33.3 g/dL (ref 31.5–35.7)
MCV: 86 fL (ref 79–97)
Monocytes Absolute: 0.6 10*3/uL (ref 0.1–0.9)
Monocytes: 18 %
Neutrophils Absolute: 2 10*3/uL (ref 1.4–7.0)
Neutrophils: 54 %
Platelets: 242 10*3/uL (ref 150–450)
RBC: 4.41 x10E6/uL (ref 3.77–5.28)
RDW: 13.9 % (ref 11.7–15.4)
WBC: 3.7 10*3/uL (ref 3.4–10.8)

## 2021-06-09 LAB — CMP14+EGFR
ALT: 19 IU/L (ref 0–32)
AST: 20 IU/L (ref 0–40)
Albumin/Globulin Ratio: 1.8 (ref 1.2–2.2)
Albumin: 4.4 g/dL (ref 3.8–4.9)
Alkaline Phosphatase: 95 IU/L (ref 44–121)
BUN/Creatinine Ratio: 10 — ABNORMAL LOW (ref 12–28)
BUN: 12 mg/dL (ref 8–27)
Bilirubin Total: 0.2 mg/dL (ref 0.0–1.2)
CO2: 26 mmol/L (ref 20–29)
Calcium: 9.1 mg/dL (ref 8.7–10.3)
Chloride: 107 mmol/L — ABNORMAL HIGH (ref 96–106)
Creatinine, Ser: 1.2 mg/dL — ABNORMAL HIGH (ref 0.57–1.00)
Globulin, Total: 2.5 g/dL (ref 1.5–4.5)
Glucose: 95 mg/dL (ref 70–99)
Potassium: 4 mmol/L (ref 3.5–5.2)
Sodium: 141 mmol/L (ref 134–144)
Total Protein: 6.9 g/dL (ref 6.0–8.5)
eGFR: 52 mL/min/{1.73_m2} — ABNORMAL LOW (ref >59)

## 2021-06-09 LAB — COVID-19, FLU A+B AND RSV
Influenza A, NAA: DETECTED — AB
Influenza B, NAA: NOT DETECTED
RSV, NAA: NOT DETECTED
SARS-CoV-2, NAA: NOT DETECTED

## 2021-06-09 LAB — SEDIMENTATION RATE: Sed Rate: 43 mm/hr — ABNORMAL HIGH (ref 0–40)

## 2021-06-09 LAB — C-REACTIVE PROTEIN: CRP: 6 mg/L (ref 0–10)

## 2021-06-09 NOTE — Progress Notes (Signed)
Please contact patient with the following message:  Your flu, COVID, and RSV test is still pending. However, your kidney labs were a little off. Possibly due to dehydration. Please be sure to drink plenty of water (8 8oz glasses a day) and get plenty of rest. I would like for you return to the clinic for repeat labs in 2 weeks.

## 2021-06-11 NOTE — Progress Notes (Signed)
Hello [name].  Your test came back positive for flu. You should start to feel better around the 5th day from the day your symptoms started. You are considered no longer contagious if you have been fever free for 24 hours without any fever reducing medications (tylenol or ibuprofen). Most people recover without issues. However if you develop SOB or difficulty breathing, please go to the ED. I hope you have a very Merry Christmas!

## 2021-06-14 NOTE — Progress Notes (Signed)
Called patient to ensure that she received her positive flu result. Name and DOB verified. Result given. Patient stated understanding. Patient states she is feeling better.

## 2021-06-17 ENCOUNTER — Encounter: Payer: Self-pay | Admitting: *Deleted

## 2021-07-01 ENCOUNTER — Other Ambulatory Visit: Payer: Self-pay | Admitting: Nurse Practitioner

## 2021-07-04 ENCOUNTER — Ambulatory Visit: Payer: No Typology Code available for payment source | Admitting: Internal Medicine

## 2021-07-08 ENCOUNTER — Other Ambulatory Visit: Payer: Self-pay

## 2021-07-08 ENCOUNTER — Encounter: Payer: Self-pay | Admitting: Internal Medicine

## 2021-07-08 ENCOUNTER — Ambulatory Visit (INDEPENDENT_AMBULATORY_CARE_PROVIDER_SITE_OTHER): Payer: PRIVATE HEALTH INSURANCE | Admitting: Internal Medicine

## 2021-07-08 VITALS — BP 114/62 | HR 65 | Temp 98.1°F | Ht 66.0 in | Wt 182.4 lb

## 2021-07-08 DIAGNOSIS — N951 Menopausal and female climacteric states: Secondary | ICD-10-CM | POA: Diagnosis not present

## 2021-07-08 DIAGNOSIS — E6609 Other obesity due to excess calories: Secondary | ICD-10-CM | POA: Diagnosis not present

## 2021-07-08 DIAGNOSIS — R5383 Other fatigue: Secondary | ICD-10-CM

## 2021-07-08 DIAGNOSIS — F5101 Primary insomnia: Secondary | ICD-10-CM

## 2021-07-08 DIAGNOSIS — Z6831 Body mass index (BMI) 31.0-31.9, adult: Secondary | ICD-10-CM

## 2021-07-08 NOTE — Progress Notes (Signed)
I,[name],acting as a Education administrator for Anita Greenland, MD.,have documented all relevant documentation on the behalf of Anita Greenland, MD,as directed by  Anita Greenland, MD while in the presence of Anita Greenland, MD.  This visit occurred during the SARS-CoV-2 public health emergency.  Safety protocols were in place, including screening questions prior to the visit, additional usage of staff PPE, and extensive cleaning of exam room while observing appropriate contact time as indicated for disinfecting solutions.  Subjective:     Patient ID: [name] , female    DOB: 1960-06-21 , 61 y.o.   MRN: [id number]   Chief Complaint  Patient presents with   hormones f/u    HPI  She is here today for f/u BHRT. She was started on Vivelle dot 0.0375mg  at her last visit. However, she has yet to start this dose because she had already picked up 0.025mg  dose. Currently, she feels well. States she is no longer having sleeping issues. She has cut back on her caffeine intake and this has helped her tremendously. She reports her fatigue has improved as well.   Insomnia Primary symptoms: no sleep disturbance, no frequent awakening, no premature morning awakening, no malaise/fatigue.   The current episode started more than one year. The onset quality is gradual. The problem occurs nightly. The problem has been gradually worsening since onset. The symptoms are aggravated by work stress and family issues. How many beverages per day that contain caffeine: 2-3.  Types of beverages you drink: tea and coffee. The symptoms are relieved by quiet environment. Past treatments include medication. The treatment provided mild relief. Typical bedtime:  10-11 P.M..  How long after going to bed to you fall asleep: over an hour.   PMH includes: family stress or anxiety.  Prior diagnostic workup includes:  Polysomnogram and blood work.    Past Medical History:  Diagnosis Date   Current use of estrogen therapy 02/24/2015    Depressed 04/21/2015   Gastroesophageal reflux disease    Hair loss 02/24/2015   Hot flashes 02/24/2015   Hypertension      Family History  Problem Relation Age of Onset   Diabetes type II Mother    Coronary artery disease Mother    Diabetes type II Father    Coronary artery disease Father    Hypertension Father    Diabetes type II Sister    Kidney disease Sister    Diabetes Brother    Thyroid disease Sister    Hypertension Sister    Hypertension Sister    Hypertension Brother    Colon cancer Neg Hx    Colon polyps Neg Hx    Breast cancer Neg Hx    Ovarian cancer Neg Hx    Uterine cancer Neg Hx    Ulcerative colitis Neg Hx    Celiac disease Neg Hx    Crohn's disease Neg Hx      Current Outpatient Medications:    ALPRAZolam (XANAX) 0.5 MG tablet, TAKE (1/2) TO (1) TABLET TWICE DAILY AS NEEDED., Disp: 30 tablet, Rfl: 0   Ascorbic Acid (VITAMIN C PO), Take by mouth., Disp: , Rfl:    BIOTIN PO, Take by mouth daily., Disp: , Rfl:    escitalopram (LEXAPRO) 20 MG tablet, TAKE 1 TABLET BY MOUTH ONCE DAILY., Disp: 90 tablet, Rfl: 0   estradiol (VIVELLE-DOT) 0.025 MG/24HR, Place 1 patch onto the skin 2 (two) times a week., Disp: 8 patch, Rfl: 2   Multiple Vitamin (MULTIVITAMIN)  capsule, Take 1 capsule by mouth daily., Disp: , Rfl:    NIFEdipine (PROCARDIA-XL/NIFEDICAL-XL) 30 MG 24 hr tablet, TAKE (1) TABLET BY MOUTH ONCE DAILY for BP, Disp: 90 tablet, Rfl: 1   OVER THE COUNTER MEDICATION, Vit d3 5,000 iu Omega 3 1,000 mg Msm joint health 1,000mg , Disp: , Rfl:    Probiotic Product (ALIGN) 4 MG CAPS, Take by mouth daily., Disp: , Rfl:    progesterone (PROMETRIUM) 100 MG capsule, Take 200 mg by mouth. Daily except sunday, Disp: , Rfl:    vitamin B-12 (CYANOCOBALAMIN) 1000 MCG tablet, Take 1,000 mcg by mouth daily., Disp: , Rfl:    Allergies  Allergen Reactions   Plaquenil [Hydroxychloroquine Sulfate]     Itching hives   Hydrocodone Itching   Lisinopril     Headache  Dizzy     Metronidazole Hives   Norvasc [Amlodipine Besylate]     Patient did not have true allergy she had side effects with medication     Review of Systems  Constitutional: Negative.  Negative for malaise/fatigue.  Respiratory: Negative.    Cardiovascular: Negative.   Gastrointestinal: Negative.   Psychiatric/Behavioral:  Negative for sleep disturbance. The patient has insomnia.   All other systems reviewed and are negative.   Today's Vitals   07/08/21 1550  BP: 114/62  Pulse: 65  Temp: 98.1 F (36.7 C)  Weight: 182 lb 6.4 oz (82.7 kg)  Height: 5\' 6"  (1.676 m)   Body mass index is 29.44 kg/m.  Wt Readings from Last 3 Encounters:  07/08/21 182 lb 6.4 oz (82.7 kg)  06/08/21 184 lb 6.4 oz (83.6 kg)  03/28/21 185 lb 6.4 oz (84.1 kg)    BP Readings from Last 3 Encounters:  07/08/21 114/62  06/08/21 (!) 150/80  03/28/21 130/74    Objective:  Physical Exam Vitals and nursing note reviewed.  Constitutional:      Appearance: Normal appearance.  HENT:     Head: Normocephalic and atraumatic.     Nose:     Comments: Masked     Mouth/Throat:     Comments: Masked  Eyes:     Extraocular Movements: Extraocular movements intact.  Cardiovascular:     Rate and Rhythm: Normal rate and regular rhythm.     Heart sounds: Normal heart sounds.  Pulmonary:     Effort: Pulmonary effort is normal.     Breath sounds: Normal breath sounds.  Musculoskeletal:     Cervical back: Normal range of motion.  Skin:    General: Skin is warm.  Neurological:     General: No focal deficit present.     Mental Status: She is alert.  Psychiatric:        Mood and Affect: Mood normal.        Behavior: Behavior normal.        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 She thinks she will be ready to start 0.025mg  Vivelle dot patches in about six weeks. She will f/u in 4 months.   2. Primary insomnia Comments: Resolved after cutting back on her caffeine. She was  congratulated on her lifestyle change.  3. Other fatigue Comments: Resolving. Her previous sx were likely related to insomnia. She is encouraged to also stay well hydrated.   4. Class 1 obesity due to excess calories with serious comorbidity and body mass index (BMI) of 31.0 to 31.9 in adult  She is encouraged to strive for BMI less than 30 to  decrease cardiac risk. Advised to aim for at least 150 minutes of exercise per week.  Patient was given opportunity to ask questions. Patient verbalized understanding of the plan and was able to repeat key elements of the plan. All questions were answered to their satisfaction.   I, Anita Greenland, MD, have reviewed all documentation for this visit. The documentation on 07/08/21 for the exam, diagnosis, procedures, and orders are all accurate and complete.   IF YOU HAVE BEEN REFERRED TO A SPECIALIST, IT MAY TAKE 1-2 WEEKS TO SCHEDULE/PROCESS THE REFERRAL. IF YOU HAVE NOT HEARD FROM US/SPECIALIST IN TWO WEEKS, PLEASE GIVE [place] A CALL AT [phone].   THE PATIENT IS ENCOURAGED TO PRACTICE SOCIAL DISTANCING DUE TO THE COVID-19 PANDEMIC.

## 2021-07-08 NOTE — Patient Instructions (Signed)
Exercising to Stay Healthy °To become healthy and stay healthy, it is recommended that you do moderate-intensity and vigorous-intensity exercise. You can tell that you are exercising at a moderate intensity if your heart starts beating faster and you start breathing faster but can still hold a conversation. You can tell that you are exercising at a vigorous intensity if you are breathing much harder and faster and cannot hold a conversation while exercising. °How can exercise benefit me? °Exercising regularly is important. It has many health benefits, such as: °Improving overall fitness, flexibility, and endurance. °Increasing bone density. °Helping with weight control. °Decreasing body fat. °Increasing muscle strength and endurance. °Reducing stress and tension, anxiety, depression, or anger. °Improving overall health. °What guidelines should I follow while exercising? °Before you start a new exercise program, talk with your health care provider. °Do not exercise so much that you hurt yourself, feel dizzy, or get very short of breath. °Wear comfortable clothes and wear shoes with good support. °Drink plenty of water while you exercise to prevent dehydration or heat stroke. °Work out until your breathing and your heartbeat get faster (moderate intensity). °How often should I exercise? °Choose an activity that you enjoy, and set realistic goals. Your health care provider can help you make an activity plan that is individually designed and works best for you. °Exercise regularly as told by your health care provider. This may include: °Doing strength training two times a week, such as: °Lifting weights. °Using resistance bands. °Push-ups. °Sit-ups. °Yoga. °Doing a certain intensity of exercise for a given amount of time. Choose from these options: °A total of 150 minutes of moderate-intensity exercise every week. °A total of 75 minutes of vigorous-intensity exercise every week. °A mix of moderate-intensity and  vigorous-intensity exercise every week. °Children, pregnant women, people who have not exercised regularly, people who are overweight, and older adults may need to talk with a health care provider about what activities are safe to perform. If you have a medical condition, be sure to talk with your health care provider before you start a new exercise program. °What are some exercise ideas? °Moderate-intensity exercise ideas include: °Walking 1 mile (1.6 km) in about 15 minutes. °Biking. °Hiking. °Golfing. °Dancing. °Water aerobics. °Vigorous-intensity exercise ideas include: °Walking 4.5 miles (7.2 km) or more in about 1 hour. °Jogging or running 5 miles (8 km) in about 1 hour. °Biking 10 miles (16.1 km) or more in about 1 hour. °Lap swimming. °Roller-skating or in-line skating. °Cross-country skiing. °Vigorous competitive sports, such as football, basketball, and soccer. °Jumping rope. °Aerobic dancing. °What are some everyday activities that can help me get exercise? °Yard work, such as: °Pushing a lawn mower. °Raking and bagging leaves. °Washing your car. °Pushing a stroller. °Shoveling snow. °Gardening. °Washing windows or floors. °How can I be more active in my day-to-day activities? °Use stairs instead of an elevator. °Take a walk during your lunch break. °If you drive, park your car farther away from your work or school. °If you take public transportation, get off one stop early and walk the rest of the way. °Stand up or walk around during all of your indoor phone calls. °Get up, stretch, and walk around every 30 minutes throughout the day. °Enjoy exercise with a friend. Support to continue exercising will help you keep a regular routine of activity. °Where to find more information °You can find more information about exercising to stay healthy from: °[place] Department of Health and Human Services: www.hhs.gov °Centers for Disease Control and Prevention (  CDC): www.cdc.gov °Summary °Exercising regularly is  important. It will improve your overall fitness, flexibility, and endurance. °Regular exercise will also improve your overall health. It can help you control your weight, reduce stress, and improve your bone density. °Do not exercise so much that you hurt yourself, feel dizzy, or get very short of breath. °Before you start a new exercise program, talk with your health care provider. °This information is not intended to replace advice given to you by your health care provider. Make sure you discuss any questions you have with your health care provider. °Document Revised: 10/01/2020 Document Reviewed: 10/01/2020 °Elsevier Patient Education © 2022 [employer] ° °

## 2021-08-05 ENCOUNTER — Other Ambulatory Visit: Payer: Self-pay | Admitting: Nurse Practitioner

## 2021-08-13 ENCOUNTER — Encounter: Payer: Self-pay | Admitting: Family Medicine

## 2021-08-22 ENCOUNTER — Other Ambulatory Visit: Payer: Self-pay | Admitting: Internal Medicine

## 2021-10-03 ENCOUNTER — Other Ambulatory Visit: Payer: Self-pay | Admitting: Nurse Practitioner

## 2021-11-09 ENCOUNTER — Other Ambulatory Visit: Payer: Self-pay | Admitting: Internal Medicine

## 2021-11-12 ENCOUNTER — Ambulatory Visit
Admission: EM | Admit: 2021-11-12 | Discharge: 2021-11-12 | Disposition: A | Discharge status: Home / Self Care | Payer: PRIVATE HEALTH INSURANCE

## 2021-11-12 DIAGNOSIS — J209 Acute bronchitis, unspecified: Secondary | ICD-10-CM

## 2021-11-12 DIAGNOSIS — J309 Allergic rhinitis, unspecified: Secondary | ICD-10-CM | POA: Diagnosis not present

## 2021-11-12 MED ORDER — FLUTICASONE PROPIONATE 50 MCG/ACT NA SUSP: 2.0000 | Freq: Every day | NASAL | 0 refills | Status: DC

## 2021-11-12 MED ORDER — PROMETHAZINE-DM 6.25-15 MG/5ML PO SYRP: 5.0000 mL | ORAL_SOLUTION | Freq: Four times a day (QID) | ORAL | 0 refills | Status: DC | PRN

## 2021-11-12 MED ORDER — PREDNISONE 20 MG PO TABS: 40.0000 mg | ORAL_TABLET | Freq: Every day | ORAL | 0 refills | Status: AC

## 2021-11-12 NOTE — ED Triage Notes (Signed)
Pt reports cough, headache and right ear pain x 2 days. Zyrtec, ibuprofen, OTC cough meds gives no relief.

## 2021-11-12 NOTE — Discharge Instructions (Addendum)
Take medication as prescribed. Continue using the Zyrtec you are currently using.  I have also prescribed fluticasone nasal spray, use as directed. Increase fluids and allow for plenty of rest. As discussed, while you are taking the prednisone, do not take ibuprofen.  You should take Tylenol extra strength until you complete the prednisone.  When she complete the prednisone, you can begin using the ibuprofen. Recommend using a humidifier at bedtime and during sleep. Sleep elevated on 2 pillows while symptoms persist. If your symptoms worsen or do not improve, please follow-up in our office.

## 2021-11-12 NOTE — ED Provider Notes (Signed)
[facility]    CSN: [id number] Arrival date & time: 11/12/21  0915      History   Chief Complaint Chief Complaint  Patient presents with   Cough   Headache   Otalgia         HPI [name] is a 61 y.o. female.   The history is provided by the patient.  The patient is a 61 year old female who presents with cough, headache, and right ear pain.  Symptoms have been present for the past 2 days.  Patient states cough is worse at night, states she was up for most of the night last evening.  She states that the cough is nonproductive at this time.  She denies fever, chills, wheezing, shortness of breath or difficulty breathing.  She states that she does have pain in her chest with the coughing.  Patient has been taking Zyrtec, ibuprofen, and Mucinex for her symptoms.  Past Medical History:  Diagnosis Date   Current use of estrogen therapy 02/24/2015   Depressed 04/21/2015   Gastroesophageal reflux disease    Hair loss 02/24/2015   Hot flashes 02/24/2015   Hypertension     Patient Active Problem List   Diagnosis Date Noted   Vitamin D deficiency 07/03/2020   Cough 06/14/2020   Acute bacterial rhinosinusitis 06/14/2020   Close exposure to COVID-19 virus 06/14/2020   Chronic insomnia 11/10/2019   Loud snoring 11/10/2019   Symptoms, such as flushing, sleeplessness, headache, lack of concentration, associated with the menopause 11/10/2019   Sleep-related headache 11/10/2019   Menopausal and female climacteric states 10/09/2017   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   Anxiety and depression 09/06/2015   Chronic fatigue 09/06/2015   Elevated ferritin 08/08/2015   Prediabetes 04/30/2015   Depressed 04/21/2015   Current use of estrogen therapy 02/24/2015   Hair loss 02/24/2015   Hot flashes 02/24/2015   Peripheral edema 11/19/2014   Essential hypertension, benign 10/25/2012   Epigastric abdominal pain 11/01/2011   Abdominal pain  06/29/2011    Past Surgical History:  Procedure Laterality Date   CHOLECYSTECTOMY     COLONOSCOPY  07/04/2011   sessile polyp in the mid transverse colon/moderate diverticulosis in the sigmoid to descending colon segments/internal hemorrhoids   ESOPHAGOGASTRODUODENOSCOPY     retrograde ureteral stone extraction     Total abdominal hysterectomy with probable salpingo-oophorectomy  2006-8   HEAVY MENSES/? CANCER   TUBAL LIGATION      OB History     Gravida  2   Para  2   Term      Preterm      AB      Living  2      SAB      IAB      Ectopic      Multiple      Live Births               Home Medications    Prior to Admission medications   Medication Sig Start Date End Date Taking? Authorizing Provider  cetirizine (ZYRTEC) 10 MG tablet Take 10 mg by mouth daily.   Yes [provider]  fluticasone (FLONASE) 50 MCG/ACT nasal spray Place 2 sprays into both nostrils daily. 11/12/21  Yes Gawain Crombie-Warren, Alda Lea, NP  ibuprofen (ADVIL) 200 MG tablet Take 200 mg by mouth every 6 (six) hours as needed.   Yes [provider]  predniSONE (DELTASONE) 20 MG tablet Take 2 tablets (40 mg  total) by mouth daily with breakfast for 5 days. 11/12/21 11/17/21 Yes Miette Molenda-Warren, Alda Lea, NP  promethazine-dextromethorphan (PROMETHAZINE-DM) 6.25-15 MG/5ML syrup Take 5 mLs by mouth 4 (four) times daily as needed for cough. 11/12/21  Yes Lorien Shingler-Warren, Alda Lea, NP  ALPRAZolam Duanne Moron) 0.5 MG tablet TAKE (1/2) TO (1) TABLET TWICE DAILY AS NEEDED. 02/22/21   Kathyrn Drown, MD  Ascorbic Acid (VITAMIN C PO) Take by mouth.    [provider]  BIOTIN PO Take by mouth daily.    [provider]  escitalopram (LEXAPRO) 20 MG tablet TAKE 1 TABLET BY MOUTH ONCE DAILY. 10/03/21   Nilda Simmer, NP  estradiol (VIVELLE-DOT) 0.025 MG/24HR Place 1 patch onto the skin 2 (two) times a week. 05/26/21   Glendale Chard, MD  estradiol (VIVELLE-DOT) 0.0375 MG/24HR PLACE 1  PATCH ONTO THE SKIN TWICE A WEEK 11/09/21   Glendale Chard, MD  Multiple Vitamin (MULTIVITAMIN) capsule Take 1 capsule by mouth daily.    [provider]  neomycin-polymyxin b-dexamethasone (MAXITROL) 3.5-10000-0.1 SUSP SMARTSIG:In Eye(s) 09/09/21   [provider]  NIFEdipine (PROCARDIA-XL/NIFEDICAL-XL) 30 MG 24 hr tablet TAKE (1) TABLET BY MOUTH ONCE DAILY. 08/06/21   Nilda Simmer, NP  ofloxacin (OCUFLOX) 0.3 % ophthalmic solution Place into the left eye. 11/01/21   [provider]  OVER THE COUNTER MEDICATION Vit d3 5,000 iu Omega 3 1,000 mg [facility]sm joint health 1,'000mg'$     [provider]  Probiotic Product (ALIGN) 4 MG CAPS Take by mouth daily.    [provider]  progesterone (PROMETRIUM) 100 MG capsule Take 200 mg by mouth. Daily except sunday    [provider]  vitamin B-12 (CYANOCOBALAMIN) 1000 MCG tablet Take 1,000 mcg by mouth daily.    [provider]    Family History Family History  Problem Relation Age of Onset   Diabetes type II Mother    Coronary artery disease Mother    Diabetes type II Father    Coronary artery disease Father    Hypertension Father    Diabetes type II Sister    Kidney disease Sister    Diabetes Brother    Thyroid disease Sister    Hypertension Sister    Hypertension Sister    Hypertension Brother    Colon cancer Neg Hx    Colon polyps Neg Hx    Breast cancer Neg Hx    Ovarian cancer Neg Hx    Uterine cancer Neg Hx    Ulcerative colitis Neg Hx    Celiac disease Neg Hx    Crohn's disease Neg Hx     Social History Social History   Tobacco Use   Smoking status: Never   Smokeless tobacco: Never  Vaping Use   Vaping Use: Never used  Substance Use Topics   Alcohol use: No   Drug use: No     Allergies   Plaquenil [hydroxychloroquine sulfate], Hydrocodone, Lisinopril, Metronidazole, and Norvasc [amlodipine besylate]   Review of Systems Review of Systems PER HPI  Physical  Exam Triage Vital Signs ED Triage Vitals  Enc Vitals Group     BP 11/12/21 0947 127/81     Pulse Rate 11/12/21 0947 60     Resp 11/12/21 0947 18     Temp 11/12/21 0947 98.8 F (37.1 C)     Temp Source 11/12/21 0947 Oral     SpO2 11/12/21 0947 96 %     Weight --      Height --  Head Circumference --      Peak Flow --      Pain Score 11/12/21 0949 5     Pain Loc --      Pain Edu? --      Excl. in [place]? --    No data found.  Updated Vital Signs BP 127/81 (BP Location: Right Arm)   Pulse 60   Temp 98.8 F (37.1 C) (Oral)   Resp 18   SpO2 96%   Visual Acuity Right Eye Distance:   Left Eye Distance:   Bilateral Distance:    Right Eye Near:   Left Eye Near:    Bilateral Near:     Physical Exam Vitals and nursing note reviewed.  Constitutional:      Appearance: She is well-developed.  HENT:     Head: Normocephalic.     Right Ear: Tympanic membrane, ear canal and external ear normal.     Left Ear: Tympanic membrane, ear canal and external ear normal.     Nose: Congestion present.     Right Turbinates: Enlarged and swollen.     Left Turbinates: Enlarged and swollen.     Right Sinus: No maxillary sinus tenderness.     Left Sinus: No maxillary sinus tenderness or frontal sinus tenderness.     Mouth/Throat:     Mouth: Mucous membranes are moist.  Eyes:     Extraocular Movements: Extraocular movements intact.     Conjunctiva/sclera: Conjunctivae normal.     Pupils: Pupils are equal, round, and reactive to light.  Cardiovascular:     Rate and Rhythm: Normal rate and regular rhythm.     Heart sounds: Normal heart sounds.  Pulmonary:     Effort: Pulmonary effort is normal.     Breath sounds: Normal breath sounds.  Abdominal:     General: Bowel sounds are normal.     Palpations: Abdomen is soft.     Tenderness: There is no abdominal tenderness.  Musculoskeletal:     Cervical back: Normal range of motion.  Lymphadenopathy:     Cervical: No cervical adenopathy.   Skin:    General: Skin is warm and dry.     Capillary Refill: Capillary refill takes less than 2 seconds.  Neurological:     Mental Status: She is alert.  Psychiatric:        Mood and Affect: Mood normal.        Speech: Speech normal.        Behavior: Behavior normal.     UC Treatments / Results  Labs (all labs ordered are listed, but only abnormal results are displayed) Labs Reviewed - No data to display  EKG   Radiology No results found.  Procedures Procedures (including critical care time)  Medications Ordered in UC Medications - No data to display  Initial Impression / Assessment and Plan / UC Course  I have reviewed the triage vital signs and the nursing notes.  Pertinent labs & imaging results that were available during my care of the patient were reviewed by me and considered in my medical decision making (see chart for details).  We will treat patient with symptomatic treatment to include prednisone, Promethazine DM, and fluticasone.  Symptoms are consistent with acute bronchitis based on the severity of her cough, along with nasal congestion, consistent with allergic rhinitis.  Patient's vital signs are stable, she is in no acute distress.  She has been afebrile since her symptoms started.  Antibiotics are not necessary at this  time based on duration of her symptoms and current symptoms.  Supportive care was recommended.  Patient advised to follow-up as needed. Final Clinical Impressions(s) / UC Diagnoses   Final diagnoses:  Acute bronchitis, unspecified organism  Allergic rhinitis, unspecified seasonality, unspecified trigger     Discharge Instructions      Take medication as prescribed. Continue using the Zyrtec you are currently using.  I have also prescribed fluticasone nasal spray, use as directed. Increase fluids and allow for plenty of rest. As discussed, while you are taking the prednisone, do not take ibuprofen.  You should take Tylenol extra  strength until you complete the prednisone.  When she complete the prednisone, you can begin using the ibuprofen. Recommend using a humidifier at bedtime and during sleep. Sleep elevated on 2 pillows while symptoms persist. If your symptoms worsen or do not improve, please follow-up in our office.     ED Prescriptions     Medication Sig Dispense Auth. Provider   predniSONE (DELTASONE) 20 MG tablet Take 2 tablets (40 mg total) by mouth daily with breakfast for 5 days. 10 tablet Theia Dezeeuw-Warren, Alda Lea, NP   promethazine-dextromethorphan (PROMETHAZINE-DM) 6.25-15 MG/5ML syrup Take 5 mLs by mouth 4 (four) times daily as needed for cough. 140 mL Brittanyann Wittner-Warren, Alda Lea, NP   fluticasone (FLONASE) 50 MCG/ACT nasal spray Place 2 sprays into both nostrils daily. 16 g Guinn Delarosa-Warren, Alda Lea, NP      PDMP not reviewed this encounter.   Tish Men, NP 11/12/21 1004

## 2021-11-17 ENCOUNTER — Ambulatory Visit (INDEPENDENT_AMBULATORY_CARE_PROVIDER_SITE_OTHER): Payer: PRIVATE HEALTH INSURANCE | Admitting: Family Medicine

## 2021-11-17 VITALS — HR 80 | Temp 98.4°F | Wt 188.2 lb

## 2021-11-17 DIAGNOSIS — J019 Acute sinusitis, unspecified: Secondary | ICD-10-CM | POA: Diagnosis not present

## 2021-11-17 DIAGNOSIS — R051 Acute cough: Secondary | ICD-10-CM

## 2021-11-17 MED ORDER — AMOXICILLIN 500 MG PO CAPS: 500.0000 mg | ORAL_CAPSULE | Freq: Three times a day (TID) | ORAL | 0 refills | Status: AC

## 2021-11-17 NOTE — Progress Notes (Signed)
   Subjective:    Patient ID: [name], female    DOB: 13-Sep-1960, 61 y.o.   MRN: [id number]  HPI  Patient has cough and congestion.  Pt went to Urgent Care on 11/12/21 and was given medication, no better. Gets worse at night.  Relates a lot of head congestion sinus pressure not feeling good started off like a viral illness then progressed  Review of Systems     Objective:   Physical Exam Gen-NAD not toxic TMS-normal bilateral T- normal no redness Chest-CTA respiratory rate normal no crackles CV RRR no murmur Skin-warm dry Neuro-grossly normal   Moderate sinus tenderness bilateral     Assessment & Plan:   Sinusitis Started off as a virus now progressed to a sinus infection Antibiotics prescribed Follow-up if progressive troubles

## 2021-11-18 LAB — NOVEL CORONAVIRUS, NAA: SARS-CoV-2, NAA: NOT DETECTED

## 2021-11-21 ENCOUNTER — Encounter: Payer: PRIVATE HEALTH INSURANCE | Admitting: Internal Medicine

## 2021-11-21 ENCOUNTER — Other Ambulatory Visit: Payer: Self-pay | Admitting: Nurse Practitioner

## 2021-11-21 NOTE — Progress Notes (Signed)
IN THE MIDDLE OF HER VIRTUAL VISIT, SHE CHANGED HER MIND AND STATED SHE WANTED TO RESCHEDULE HER APPT.   ERRONEOUS VISIT--------------------------------------   Virtual Visit via    This visit type was conducted due to national recommendations for restrictions regarding the COVID-19 Pandemic (e.g. social distancing) in an effort to limit this patient's exposure and mitigate transmission in our community.  Due to her co-morbid illnesses, this patient is at least at moderate risk for complications without adequate follow up.  This format is felt to be most appropriate for this patient at this time.  All issues noted in this document were discussed and addressed.  A limited physical exam was performed with this format.    This visit type was conducted due to national recommendations for restrictions regarding the COVID-19 Pandemic (e.g. social distancing) in an effort to limit this patient's exposure and mitigate transmission in our community.  Patients identity confirmed using two different identifiers.  This format is felt to be most appropriate for this patient at this time.  All issues noted in this document were discussed and addressed.  No physical exam was performed (except for noted visual exam findings with Video Visits).    Date:  11/21/2021   ID:  [name], DOB 1960/07/18, MRN [id number]  Patient Location:    Provider location:   Office    Chief Complaint:    History of Present Illness:    [name] is a 61 y.o. female who presents via video conferencing for a telehealth visit today.    The patient does not have symptoms concerning for COVID-19 infection (fever, chills, cough, or new shortness of breath).   HPI   Past Medical History:  Diagnosis Date   Current use of estrogen therapy 02/24/2015   Depressed 04/21/2015   Gastroesophageal reflux disease    Hair loss 02/24/2015   Hot flashes 02/24/2015   Hypertension    Past Surgical History:  Procedure Laterality  Date   CHOLECYSTECTOMY     COLONOSCOPY  07/04/2011   sessile polyp in the mid transverse colon/moderate diverticulosis in the sigmoid to descending colon segments/internal hemorrhoids   ESOPHAGOGASTRODUODENOSCOPY     retrograde ureteral stone extraction     Total abdominal hysterectomy with probable salpingo-oophorectomy  2006-8   HEAVY MENSES/? CANCER   TUBAL LIGATION       Current Meds  Medication Sig   ALPRAZolam (XANAX) 0.5 MG tablet TAKE (1/2) TO (1) TABLET TWICE DAILY AS NEEDED.   amoxicillin (AMOXIL) 500 MG capsule Take 1 capsule (500 mg total) by mouth 3 (three) times daily for 10 days.   Ascorbic Acid (VITAMIN C PO) Take by mouth.   BIOTIN PO Take by mouth daily.   cetirizine (ZYRTEC) 10 MG tablet Take 10 mg by mouth daily.   escitalopram (LEXAPRO) 20 MG tablet TAKE 1 TABLET BY MOUTH ONCE DAILY.   estradiol (VIVELLE-DOT) 0.025 MG/24HR Place 1 patch onto the skin 2 (two) times a week.   estradiol (VIVELLE-DOT) 0.0375 MG/24HR PLACE 1 PATCH ONTO THE SKIN TWICE A WEEK   fluticasone (FLONASE) 50 MCG/ACT nasal spray Place 2 sprays into both nostrils daily.   ibuprofen (ADVIL) 200 MG tablet Take 200 mg by mouth every 6 (six) hours as needed.   Multiple Vitamin (MULTIVITAMIN) capsule Take 1 capsule by mouth daily.   ofloxacin (OCUFLOX) 0.3 % ophthalmic solution Place into the left eye.   OVER THE COUNTER MEDICATION Vit d3 5,000 iu Omega 3 1,000 mg [facility]sm joint health 1,'000mg'$   prednisoLONE acetate (PRED FORTE) 1 % ophthalmic suspension SMARTSIG:In Eye(s)   Probiotic Product (ALIGN) 4 MG CAPS Take by mouth daily.   progesterone (PROMETRIUM) 100 MG capsule Take 200 mg by mouth. Daily except sunday   vitamin B-12 (CYANOCOBALAMIN) 1000 MCG tablet Take 1,000 mcg by mouth daily.   [DISCONTINUED] NIFEdipine (PROCARDIA-XL/NIFEDICAL-XL) 30 MG 24 hr tablet TAKE (1) TABLET BY MOUTH ONCE DAILY.     Allergies:   Plaquenil [hydroxychloroquine sulfate], Hydrocodone, Lisinopril, Metronidazole, and  Norvasc [amlodipine besylate]   Social History   Tobacco Use   Smoking status: Never   Smokeless tobacco: Never  Vaping Use   Vaping Use: Never used  Substance Use Topics   Alcohol use: No   Drug use: No     Family Hx: The patient's family history includes Coronary artery disease in her father and mother; Diabetes in her brother; Diabetes type II in her father, mother, and sister; Hypertension in her brother, father, sister, and sister; Kidney disease in her sister; Thyroid disease in her sister. There is no history of Colon cancer, Colon polyps, Breast cancer, Ovarian cancer, Uterine cancer, Ulcerative colitis, Celiac disease, or Crohn's disease.  ROS:   Please see the history of present illness.    ROS  All other systems reviewed and are negative.   Labs/Other Tests and Data Reviewed:    Recent Labs: 03/14/2021: TSH 1.810 06/08/2021: ALT 19; BUN 12; Creatinine, Ser 1.20; Hemoglobin 12.7; Platelets 242; Potassium 4.0; Sodium 141   Recent Lipid Panel Lab Results  Component Value Date/Time   CHOL 211 (H) 03/14/2021 10:03 AM   TRIG 97 03/14/2021 10:03 AM   HDL 65 03/14/2021 10:03 AM   CHOLHDL 3.2 03/14/2021 10:03 AM   LDLCALC 129 (H) 03/14/2021 10:03 AM    Wt Readings from Last 3 Encounters:  11/21/21 188 lb (85.3 kg)  11/17/21 188 lb 3.2 oz (85.4 kg)  07/08/21 182 lb 6.4 oz (82.7 kg)     Exam:    Vital Signs:  Ht '5\' 6"'$  (1.676 m)   Wt 188 lb (85.3 kg)   BMI 30.34 kg/m     Physical Exam  ASSESSMENT & PLAN:     There are no diagnoses linked to this encounter.   COVID-19 Education: The signs and symptoms of COVID-19 were discussed with the patient and how to seek care for testing (follow up with PCP or arrange E-visit).  The importance of social distancing was discussed today.  Patient Risk:   After full review of this patients clinical status, I feel that they are at least moderate risk at this time.  Time:   Today, I have spent  minutes/ seconds with the  patient with telehealth technology discussing above diagnoses.     Medication Adjustments/Labs and Tests Ordered: Current medicines are reviewed at length with the patient today.  Concerns regarding medicines are outlined above.   Tests Ordered: No orders of the defined types were placed in this encounter.   Medication Changes: No orders of the defined types were placed in this encounter.   Disposition:  Follow up prn  Signed, [name], MD

## 2021-12-19 ENCOUNTER — Encounter: Payer: Self-pay | Admitting: Internal Medicine

## 2021-12-19 ENCOUNTER — Ambulatory Visit (INDEPENDENT_AMBULATORY_CARE_PROVIDER_SITE_OTHER): Payer: PRIVATE HEALTH INSURANCE | Admitting: Internal Medicine

## 2021-12-19 VITALS — BP 128/68 | HR 81 | Temp 98.1°F | Ht 66.0 in | Wt 187.4 lb

## 2021-12-19 DIAGNOSIS — Z6830 Body mass index (BMI) 30.0-30.9, adult: Secondary | ICD-10-CM | POA: Diagnosis not present

## 2021-12-19 DIAGNOSIS — Z1211 Encounter for screening for malignant neoplasm of colon: Secondary | ICD-10-CM

## 2021-12-19 DIAGNOSIS — N951 Menopausal and female climacteric states: Secondary | ICD-10-CM | POA: Diagnosis not present

## 2021-12-19 DIAGNOSIS — R632 Polyphagia: Secondary | ICD-10-CM | POA: Diagnosis not present

## 2021-12-19 DIAGNOSIS — L661 Lichen planopilaris: Secondary | ICD-10-CM

## 2021-12-19 MED ORDER — WEGOVY 1 MG/0.5ML ~~LOC~~ SOAJ
1.0000 mg | SUBCUTANEOUS | 0 refills | Status: DC
Start: 2021-12-19 — End: 2022-01-10

## 2021-12-19 NOTE — Progress Notes (Signed)
[name],acting as a Education administrator for Anita Greenland, MD.,have documented all relevant documentation on the behalf of Anita Greenland, MD,as directed by  Anita Greenland, MD while in the presence of Anita Greenland, MD.  This visit occurred during the SARS-CoV-2 public health emergency.  Safety protocols were in place, including screening questions prior to the visit, additional usage of staff PPE, and extensive cleaning of exam room while observing appropriate contact time as indicated for disinfecting solutions.  Subjective:     Patient ID: [name] , female    DOB: [birth year] , 61 y.o.   MRN: [id number]   Chief Complaint  Patient presents with  . hormones  . climacteric    HPI  She is here today for f/u BHRT.   Insomnia Primary symptoms: no sleep disturbance, no frequent awakening, no premature morning awakening, no malaise/fatigue.   The current episode started more than one year. The onset quality is gradual. The problem occurs nightly. The problem has been gradually worsening since onset. The symptoms are aggravated by work stress and family issues. How many beverages per day that contain caffeine: 2-3.  Types of beverages you drink: tea and coffee. The symptoms are relieved by quiet environment. Past treatments include medication. The treatment provided mild relief. Typical bedtime:  10-11 P.M..  How long after going to bed to you fall asleep: over an hour.   PMH includes: family stress or anxiety.  Prior diagnostic workup includes:  Polysomnogram and blood work.     Past Medical History:  Diagnosis Date  . Current use of estrogen therapy 02/24/2015  . Depressed 04/21/2015  . Gastroesophageal reflux disease   . Hair loss 02/24/2015  . Hot flashes 02/24/2015  . Hypertension      Family History  Problem Relation Age of Onset  . Diabetes type II Mother   . Coronary artery disease Mother   . Diabetes type II Father   . Coronary artery disease Father   . Hypertension  Father   . Diabetes type II Sister   . Kidney disease Sister   . Diabetes Brother   . Thyroid disease Sister   . Hypertension Sister   . Hypertension Sister   . Hypertension Brother   . Colon cancer Neg Hx   . Colon polyps Neg Hx   . Breast cancer Neg Hx   . Ovarian cancer Neg Hx   . Uterine cancer Neg Hx   . Ulcerative colitis Neg Hx   . Celiac disease Neg Hx   . Crohn's disease Neg Hx      Current Outpatient Medications:  .  ALPRAZolam (XANAX) 0.5 MG tablet, TAKE (1/2) TO (1) TABLET TWICE DAILY AS NEEDED., Disp: 30 tablet, Rfl: 0 .  Ascorbic Acid (VITAMIN C PO), Take by mouth., Disp: , Rfl:  .  cetirizine (ZYRTEC) 10 MG tablet, Take 10 mg by mouth daily., Disp: , Rfl:  .  escitalopram (LEXAPRO) 20 MG tablet, TAKE 1 TABLET BY MOUTH ONCE DAILY., Disp: 90 tablet, Rfl: 0 .  fluticasone (FLONASE) 50 MCG/ACT nasal spray, Place 2 sprays into both nostrils daily., Disp: 16 g, Rfl: 0 .  ibuprofen (ADVIL) 200 MG tablet, Take 200 mg by mouth every 6 (six) hours as needed., Disp: , Rfl:  .  Multiple Vitamin (MULTIVITAMIN) capsule, Take 1 capsule by mouth daily., Disp: , Rfl:  .  NIFEdipine (PROCARDIA-XL/NIFEDICAL-XL) 30 MG 24 hr tablet, TAKE (1) TABLET BY MOUTH ONCE DAILY., Disp: 30 tablet, Rfl: 0 .  OVER THE COUNTER MEDICATION, Vit d3 5,000 iu Omega 3 1,000 mg Msm joint health 1,'000mg'$ , Disp: , Rfl:  .  prednisoLONE acetate (PRED FORTE) 1 % ophthalmic suspension, SMARTSIG:In Eye(s), Disp: , Rfl:  .  Probiotic Product (ALIGN) 4 MG CAPS, Take by mouth daily., Disp: , Rfl:  .  progesterone (PROMETRIUM) 100 MG capsule, Take 200 mg by mouth. Daily except sunday, Disp: , Rfl:  .  vitamin B-12 (CYANOCOBALAMIN) 1000 MCG tablet, Take 1,000 mcg by mouth daily., Disp: , Rfl:  .  BIOTIN PO, Take by mouth daily., Disp: , Rfl:  .  estradiol (VIVELLE-DOT) 0.025 MG/24HR, Place 1 patch onto the skin 2 (two) times a week., Disp: 8 patch, Rfl: 2 .  ofloxacin (OCUFLOX) 0.3 % ophthalmic solution, Place into the  left eye. (Patient not taking: Reported on 12/19/2021), Disp: , Rfl:    Allergies  Allergen Reactions  . Plaquenil [Hydroxychloroquine Sulfate]     Itching hives  . Hydrocodone Itching  . Lisinopril     Headache  Dizzy   . Metronidazole Hives  . Norvasc [Amlodipine Besylate]     Patient did not have true allergy she had side effects with medication     Review of Systems  Constitutional: Negative.  Negative for malaise/fatigue.  HENT: Negative.    Eyes: Negative.   Cardiovascular: Negative.   Gastrointestinal: Negative.   Endocrine: Negative.   Genitourinary: Negative.   Musculoskeletal: Negative.   Psychiatric/Behavioral:  Negative for sleep disturbance. The patient has insomnia.      Today's Vitals   12/19/21 1650  BP: 128/68  Pulse: 81  Temp: 98.1 F (36.7 C)  TempSrc: Oral  Weight: 187 lb 6.4 oz (85 kg)  Height: '5\' 6"'$  (1.676 m)  PainSc: 0-No pain   Body mass index is 30.25 kg/m.  Wt Readings from Last 3 Encounters:  12/19/21 187 lb 6.4 oz (85 kg)  11/21/21 188 lb (85.3 kg)  11/17/21 188 lb 3.2 oz (85.4 kg)     Objective:  Physical Exam Vitals and nursing note reviewed.  Constitutional:      Appearance: Normal appearance.  HENT:     Head: Normocephalic and atraumatic.  Cardiovascular:     Rate and Rhythm: Normal rate and regular rhythm.     Heart sounds: Normal heart sounds.  Pulmonary:     Effort: Pulmonary effort is normal.     Breath sounds: Normal breath sounds.  Musculoskeletal:     Cervical back: Normal range of motion.  Skin:    General: Skin is warm.  Neurological:     General: No focal deficit present.     Mental Status: She is alert.  Psychiatric:        Mood and Affect: Mood normal.        Behavior: Behavior normal.     Assessment And Plan:     1. Female climacteric state  2. Special screening for malignant neoplasm of colon  3. Adult BMI 30.0-30.9 kg/sq m     Patient was given opportunity to ask questions. Patient verbalized  understanding of the plan and was able to repeat key elements of the plan. All questions were answered to their satisfaction.  Anita Greenland, MD   I, Anita Greenland, MD, have reviewed all documentation for this visit. The documentation on 12/19/21 for the exam, diagnosis, procedures, and orders are all accurate and complete.   IF YOU HAVE BEEN REFERRED TO A SPECIALIST, IT MAY TAKE 1-2 WEEKS TO SCHEDULE/PROCESS THE REFERRAL.  IF YOU HAVE NOT HEARD FROM US/SPECIALIST IN TWO WEEKS, PLEASE GIVE [place] A CALL AT [phone].   THE PATIENT IS ENCOURAGED TO PRACTICE SOCIAL DISTANCING DUE TO THE COVID-19 PANDEMIC.

## 2021-12-22 ENCOUNTER — Encounter: Payer: Self-pay | Admitting: Internal Medicine

## 2021-12-22 LAB — CBC
Hematocrit: 40.3 % (ref 34.0–46.6)
Hemoglobin: 13.1 g/dL (ref 11.1–15.9)
MCH: 28.5 pg (ref 26.6–33.0)
MCHC: 32.5 g/dL (ref 31.5–35.7)
MCV: 88 fL (ref 79–97)
Platelets: 257 10*3/uL (ref 150–450)
RBC: 4.59 x10E6/uL (ref 3.77–5.28)
RDW: 14.4 % (ref 11.7–15.4)
WBC: 7.9 10*3/uL (ref 3.4–10.8)

## 2021-12-22 LAB — IRON,TIBC AND FERRITIN PANEL
Ferritin: 283 ng/mL — ABNORMAL HIGH (ref 15–150)
Iron Saturation: 17 % (ref 15–55)
Iron: 46 ug/dL (ref 27–139)
Total Iron Binding Capacity: 272 ug/dL (ref 250–450)
UIBC: 226 ug/dL (ref 118–369)

## 2021-12-22 LAB — CMP14+EGFR
ALT: 12 IU/L (ref 0–32)
AST: 12 IU/L (ref 0–40)
Albumin/Globulin Ratio: 1.8 (ref 1.2–2.2)
Albumin: 4.4 g/dL (ref 3.8–4.8)
Alkaline Phosphatase: 95 IU/L (ref 44–121)
BUN/Creatinine Ratio: 17 (ref 12–28)
BUN: 16 mg/dL (ref 8–27)
Bilirubin Total: 0.3 mg/dL (ref 0.0–1.2)
CO2: 22 mmol/L (ref 20–29)
Calcium: 9.1 mg/dL (ref 8.7–10.3)
Chloride: 107 mmol/L — ABNORMAL HIGH (ref 96–106)
Creatinine, Ser: 0.94 mg/dL (ref 0.57–1.00)
Globulin, Total: 2.4 g/dL (ref 1.5–4.5)
Glucose: 95 mg/dL (ref 70–99)
Potassium: 4.1 mmol/L (ref 3.5–5.2)
Sodium: 141 mmol/L (ref 134–144)
Total Protein: 6.8 g/dL (ref 6.0–8.5)
eGFR: 69 mL/min/{1.73_m2} (ref >59)

## 2021-12-22 LAB — ANTINUCLEAR ANTIBODIES, IFA: ANA Titer 1: NEGATIVE

## 2021-12-22 LAB — INSULIN, RANDOM: INSULIN: 32.7 u[IU]/mL — ABNORMAL HIGH (ref 2.6–24.9)

## 2021-12-22 LAB — HEMOGLOBIN A1C
Est. average glucose Bld gHb Est-mCnc: 123 mg/dL
Hgb A1c MFr Bld: 5.9 % — ABNORMAL HIGH (ref 4.8–5.6)

## 2021-12-26 ENCOUNTER — Encounter: Payer: Self-pay | Admitting: *Deleted

## 2021-12-28 ENCOUNTER — Other Ambulatory Visit: Payer: Self-pay | Admitting: Family Medicine

## 2022-01-04 ENCOUNTER — Other Ambulatory Visit: Payer: Self-pay | Admitting: Nurse Practitioner

## 2022-01-04 ENCOUNTER — Other Ambulatory Visit: Payer: Self-pay | Admitting: Family Medicine

## 2022-01-04 NOTE — Telephone Encounter (Signed)
Last rf, urgent care visits dont count needs a follow up visit

## 2022-01-10 ENCOUNTER — Encounter: Payer: Self-pay | Admitting: *Deleted

## 2022-01-10 NOTE — Patient Instructions (Addendum)
Procedure: Colonoscopy  Estimated body mass index is 30.25 kg/m as calculated from the following:   Height as of 12/19/21: '5\' 6"'$  (1.676 m).   Weight as of 12/19/21: 187 lb 6.4 oz (85 kg).  Have you had a colonoscopy before?  Yes, 07/04/11, Dr. Oneida Alar  Do you have family history of colon cancer  no  Do you have a family history of polyps? no  Previous colonoscopy with polyps removed? yes  Do you have a history colorectal cancer?   no  Are you diabetic?  no  Do you have a prosthetic or mechanical heart valve? no  Do you have a pacemaker/defibrillator?   no  Have you had endocarditis/atrial fibrillation?  no  Do you use supplemental oxygen/CPAP?  no  Have you had joint replacement within the last 12 months?  no  Do you tend to be constipated or have to use laxatives?  no   Do you have history of alcohol use? If yes, how much and how often.  no  Do you have history or are you using drugs? If yes, what do are you  using?  no  Have you ever had a stroke/heart attack?  no  Have you ever had a heart or other vascular stent placed,?no  Do you take weight loss medication? no  female patients,: have you had a hysterectomy? yes                              are you post menopausal?                                do you still have your menstrual cycle?     Date of last menstrual period. 2018  Do you take any blood-thinning medications such as: (Plavix, aspirin, Coumadin, Aggrenox, Brilinta, Xarelto, Eliquis, Pradaxa, Savaysa or Effient) no  If yes we need the name, milligram, dosage and who is prescribing doctor:               Current Outpatient Medications on File Prior to Visit  Medication Sig Dispense Refill   ALPRAZolam (XANAX) 0.5 MG tablet TAKE (1/2) TO (1) TABLET TWICE DAILY AS NEEDED. 30 tablet 0   Ascorbic Acid (VITAMIN C PO) Take by mouth.     BIOTIN PO Take by mouth daily.     cetirizine (ZYRTEC) 10 MG tablet Take 10 mg by mouth daily.     escitalopram (LEXAPRO) 20  MG tablet TAKE 1 TABLET BY MOUTH ONCE DAILY. 90 tablet 0   estradiol (VIVELLE-DOT) 0.025 MG/24HR Place 1 patch onto the skin 2 (two) times a week. 8 patch 2   fluticasone (FLONASE) 50 MCG/ACT nasal spray Place 2 sprays into both nostrils daily. 16 g 0   ibuprofen (ADVIL) 200 MG tablet Take 200 mg by mouth every 6 (six) hours as needed.     Multiple Vitamin (MULTIVITAMIN) capsule Take 1 capsule by mouth daily.     NIFEdipine (PROCARDIA-XL/NIFEDICAL-XL) 30 MG 24 hr tablet TAKE (1) TABLET BY MOUTH ONCE DAILY. 30 tablet 0   OVER THE COUNTER MEDICATION Vit d3 5,000 iu Omega 3 1,000 mg Msm joint health 1,'000mg'$      prednisoLONE acetate (PRED FORTE) 1 % ophthalmic suspension SMARTSIG:In Eye(s)     Probiotic Product (ALIGN) 4 MG CAPS Take by mouth daily.     progesterone (PROMETRIUM) 100 MG capsule Take 200 mg  by mouth. Daily except sunday     vitamin B-12 (CYANOCOBALAMIN) 1000 MCG tablet Take 1,000 mcg by mouth daily.     No current facility-administered medications on file prior to visit.      Allergies  Allergen Reactions   Plaquenil [Hydroxychloroquine Sulfate]     Itching hives   Hydrocodone Itching   Lisinopril     Headache  Dizzy    Metronidazole Hives   Norvasc [Amlodipine Besylate]     Patient did not have true allergy she had side effects with medication

## 2022-01-13 NOTE — Progress Notes (Signed)
Ok to schedule. Asa 2.

## 2022-01-18 ENCOUNTER — Encounter: Payer: Self-pay | Admitting: *Deleted

## 2022-01-18 MED ORDER — PEG 3350-KCL-NA BICARB-NACL 420 G PO SOLR: 4000.0000 mL | Freq: Once | ORAL | 0 refills | Status: AC

## 2022-01-18 NOTE — Progress Notes (Signed)
Called [facility] and spoke with [place]. No PA required. Ref# [id number]

## 2022-01-18 NOTE — Progress Notes (Signed)
Tammy R called pt and scheduled for 8/21 with Dr. Abbey Chatters. Aware will mail instructions. Rx sent to pharmacy

## 2022-01-27 ENCOUNTER — Other Ambulatory Visit: Payer: Self-pay | Admitting: Family Medicine

## 2022-02-01 ENCOUNTER — Encounter (HOSPITAL_COMMUNITY)
Admission: RE | Admit: 2022-02-01 | Discharge: 2022-02-01 | Disposition: A | Discharge status: Home / Self Care | Payer: PRIVATE HEALTH INSURANCE | Source: Ambulatory Visit | Attending: Internal Medicine | Admitting: Internal Medicine

## 2022-02-06 ENCOUNTER — Other Ambulatory Visit: Payer: Self-pay

## 2022-02-06 ENCOUNTER — Encounter (HOSPITAL_COMMUNITY): Payer: Self-pay

## 2022-02-06 ENCOUNTER — Ambulatory Visit (HOSPITAL_COMMUNITY): Payer: PRIVATE HEALTH INSURANCE | Admitting: Anesthesiology

## 2022-02-06 ENCOUNTER — Ambulatory Visit (HOSPITAL_BASED_OUTPATIENT_CLINIC_OR_DEPARTMENT_OTHER): Payer: PRIVATE HEALTH INSURANCE | Admitting: Anesthesiology

## 2022-02-06 ENCOUNTER — Ambulatory Visit (HOSPITAL_COMMUNITY)
Admission: RE | Admit: 2022-02-06 | Discharge: 2022-02-06 | Disposition: A | Discharge status: Home / Self Care | Payer: PRIVATE HEALTH INSURANCE | Attending: Internal Medicine | Admitting: Internal Medicine

## 2022-02-06 ENCOUNTER — Encounter (HOSPITAL_COMMUNITY)
Admission: RE | Disposition: A | Discharge status: Home / Self Care | Payer: Self-pay | Source: Home / Self Care | Attending: Internal Medicine

## 2022-02-06 DIAGNOSIS — Z1211 Encounter for screening for malignant neoplasm of colon: Secondary | ICD-10-CM | POA: Diagnosis present

## 2022-02-06 DIAGNOSIS — K5730 Diverticulosis of large intestine without perforation or abscess without bleeding: Secondary | ICD-10-CM | POA: Insufficient documentation

## 2022-02-06 DIAGNOSIS — F418 Other specified anxiety disorders: Secondary | ICD-10-CM | POA: Diagnosis not present

## 2022-02-06 DIAGNOSIS — K648 Other hemorrhoids: Secondary | ICD-10-CM

## 2022-02-06 DIAGNOSIS — I10 Essential (primary) hypertension: Secondary | ICD-10-CM | POA: Diagnosis not present

## 2022-02-06 DIAGNOSIS — K219 Gastro-esophageal reflux disease without esophagitis: Secondary | ICD-10-CM | POA: Diagnosis not present

## 2022-02-06 DIAGNOSIS — D122 Benign neoplasm of ascending colon: Secondary | ICD-10-CM | POA: Diagnosis not present

## 2022-02-06 DIAGNOSIS — K635 Polyp of colon: Secondary | ICD-10-CM

## 2022-02-06 HISTORY — PX: POLYPECTOMY: SHX5525

## 2022-02-06 HISTORY — PX: COLONOSCOPY WITH PROPOFOL: SHX5780

## 2022-02-06 SURGERY — COLONOSCOPY WITH PROPOFOL
Anesthesia: General

## 2022-02-06 MED ORDER — PROPOFOL 10 MG/ML IV BOLUS
INTRAVENOUS | Status: DC | PRN
  Administered 2022-02-06: 80 mg via INTRAVENOUS

## 2022-02-06 MED ORDER — LACTATED RINGERS IV SOLN: INTRAVENOUS | Status: DC | PRN

## 2022-02-06 MED ORDER — LIDOCAINE 2% (20 MG/ML) 5 ML SYRINGE
INTRAMUSCULAR | Status: DC | PRN
  Administered 2022-02-06: 50 mg via INTRAVENOUS

## 2022-02-06 MED ORDER — PROPOFOL 500 MG/50ML IV EMUL
INTRAVENOUS | Status: DC | PRN
  Administered 2022-02-06: 200 ug/kg/min via INTRAVENOUS

## 2022-02-06 NOTE — Anesthesia Postprocedure Evaluation (Signed)
Anesthesia Post Note  Patient: [name]  Procedure(s) Performed: COLONOSCOPY WITH PROPOFOL POLYPECTOMY  Patient location during evaluation: Phase II Anesthesia Type: General Level of consciousness: awake Pain management: pain level controlled Vital Signs Assessment: post-procedure vital signs reviewed and stable Respiratory status: spontaneous breathing and respiratory function stable Cardiovascular status: blood pressure returned to baseline and stable Postop Assessment: no headache and no apparent nausea or vomiting Anesthetic complications: no Comments: Late entry   No notable events documented.   Last Vitals:  Vitals:   02/06/22 0939 02/06/22 1039  BP: 139/73 114/60  Pulse:  61  Resp: 17 18  Temp: 36.5 C 36.6 C  SpO2: 95% 93%    Last Pain:  Vitals:   02/06/22 1039  TempSrc: Oral  PainSc: 0-No pain                 [name]

## 2022-02-06 NOTE — Discharge Instructions (Signed)
  Colonoscopy Discharge Instructions  Read the instructions outlined below and refer to this sheet in the next few weeks. These discharge instructions provide you with general information on caring for yourself after you leave the hospital. Your doctor may also give you specific instructions. While your treatment has been planned according to the most current medical practices available, unavoidable complications occasionally occur.   ACTIVITY You may resume your regular activity, but move at a slower pace for the next 24 hours.  Take frequent rest periods for the next 24 hours.  Walking will help get rid of the air and reduce the bloated feeling in your belly (abdomen).  No driving for 24 hours (because of the medicine (anesthesia) used during the test).   Do not sign any important legal documents or operate any machinery for 24 hours (because of the anesthesia used during the test).  NUTRITION Drink plenty of fluids.  You may resume your normal diet as instructed by your doctor.  Begin with a light meal and progress to your normal diet. Heavy or fried foods are harder to digest and may make you feel sick to your stomach (nauseated).  Avoid alcoholic beverages for 24 hours or as instructed.  MEDICATIONS You may resume your normal medications unless your doctor tells you otherwise.  WHAT YOU CAN EXPECT TODAY Some feelings of bloating in the abdomen.  Passage of more gas than usual.  Spotting of blood in your stool or on the toilet paper.  IF YOU HAD POLYPS REMOVED DURING THE COLONOSCOPY: No aspirin products for 7 days or as instructed.  No alcohol for 7 days or as instructed.  Eat a soft diet for the next 24 hours.  FINDING OUT THE RESULTS OF YOUR TEST Not all test results are available during your visit. If your test results are not back during the visit, make an appointment with your caregiver to find out the results. Do not assume everything is normal if you have not heard from your  caregiver or the medical facility. It is important for you to follow up on all of your test results.  SEEK IMMEDIATE MEDICAL ATTENTION IF: You have more than a spotting of blood in your stool.  Your belly is swollen (abdominal distention).  You are nauseated or vomiting.  You have a temperature over 101.  You have abdominal pain or discomfort that is severe or gets worse throughout the day.   Your colonoscopy revealed 1 polyp(s) which I removed successfully. Await pathology results, my office will contact you. I recommend repeating colonoscopy in 5 years for surveillance purposes.   You also have diverticulosis and internal hemorrhoids. I would recommend increasing fiber in your diet or adding OTC Benefiber/Metamucil. Be sure to drink at least 4 to 6 glasses of water daily. Follow-up with GI as needed.   I hope you have a great rest of your week!  Marleah Beever K. Leotha Westermeyer, D.O. Gastroenterology and Hepatology [facility]  

## 2022-02-06 NOTE — Transfer of Care (Signed)
Immediate Anesthesia Transfer of Care Note  Patient: [name]  Procedure(s) Performed: COLONOSCOPY WITH PROPOFOL POLYPECTOMY  Patient Location: Short Stay  Anesthesia Type:General  Level of Consciousness: sedated and responds to stimulation  Airway & Oxygen Therapy: Patient Spontanous Breathing  Post-op Assessment: Report given to RN, Post -op Vital signs reviewed and stable and Patient moving all extremities  Post vital signs: Reviewed and stable  Last Vitals:  Vitals Value Taken Time  BP 114/60 02/06/22 1039  Temp 36.6 C 02/06/22 1039  Pulse 61 02/06/22 1039  Resp 18 02/06/22 1039  SpO2 93 % 02/06/22 1039    Last Pain:  Vitals:   02/06/22 1039  TempSrc: Oral  PainSc: 0-No pain      Patients Stated Pain Goal: 8 (63/89/37 3428)  Complications: No notable events documented.

## 2022-02-06 NOTE — Op Note (Signed)
[facility] Patient Name: [name] Procedure Date: 02/06/2022 10:07 AM MRN: [id number] Date of Birth: 1961/05/08 Attending MD: Elon Alas. Edgar Frisk CSN: [id number] Age: 61 Admit Type: Outpatient Procedure:                Colonoscopy Indications:              Screening for colorectal malignant neoplasm Providers:                Elon Alas. Abbey Chatters, DO, Caprice Kluver, Crystal Page,                            Raphael Gibney, Technician, Ladoris Gene Technician,                            Technician Referring MD:              Medicines:                See the Anesthesia note for documentation of the                            administered medications Complications:            No immediate complications. Estimated Blood Loss:     Estimated blood loss was minimal. Procedure:                Pre-Anesthesia Assessment:                           - The anesthesia plan was to use monitored                            anesthesia care (MAC).                           After obtaining informed consent, the colonoscope                            was passed under direct vision. Throughout the                            procedure, the patient's blood pressure, pulse, and                            oxygen saturations were monitored continuously. The                            [id number]-[id number] ([id number]) was introduced through the                            anus and advanced to the the cecum, identified by                            appendiceal orifice and ileocecal valve. The                            colonoscopy was performed without difficulty. The  patient tolerated the procedure well. The quality                            of the bowel preparation was evaluated using the                            BBPS [facility] Bowel Preparation Scale) with scores                            of: Right Colon = 2 (minor amount of residual                            staining, small fragments of  stool and/or opaque                            liquid, but mucosa seen well), Transverse Colon = 2                            (minor amount of residual staining, small fragments                            of stool and/or opaque liquid, but mucosa seen                            well) and Left Colon = 3 (entire mucosa seen well                            with no residual staining, small fragments of stool                            or opaque liquid). The total BBPS score equals 7.                            The quality of the bowel preparation was good. Scope In: 10:20:41 AM Scope Out: 10:33:47 AM Scope Withdrawal Time: 0 hours 9 minutes 27 seconds  Total Procedure Duration: 0 hours 13 minutes 6 seconds  Findings:      The perianal and digital rectal examinations were normal.      Non-bleeding internal hemorrhoids were found during endoscopy.      Multiple small and large-mouthed diverticula were found in the sigmoid       colon.      A 7 mm polyp was found in the ascending colon. The polyp was sessile.       The polyp was removed with a cold snare. Resection and retrieval were       complete.      The exam was otherwise without abnormality. Impression:               - Non-bleeding internal hemorrhoids.                           - Diverticulosis in the sigmoid colon.                           -  One 7 mm polyp in the ascending colon, removed                            with a cold snare. Resected and retrieved.                           - The examination was otherwise normal. Moderate Sedation:      Per Anesthesia Care Recommendation:           - Patient has a contact number available for                            emergencies. The signs and symptoms of potential                            delayed complications were discussed with the                            patient. Return to normal activities tomorrow.                            Written discharge instructions were provided to the                             patient.                           - Resume previous diet.                           - Continue present medications.                           - Await pathology results.                           - Repeat colonoscopy in 5 years for surveillance.                           - Return to GI clinic PRN. Procedure Code(s):        --- Professional ---                           [phone], Colonoscopy, flexible; with removal of                            tumor(s), polyp(s), or other lesion(s) by snare                            technique Diagnosis Code(s):        --- Professional ---                           Z12.11, Encounter for screening for malignant  neoplasm of colon                           K64.8, Other hemorrhoids                           K63.5, Polyp of colon                           K57.30, Diverticulosis of large intestine without                            perforation or abscess without bleeding CPT copyright 2019 American Medical Association. All rights reserved. The codes documented in this report are preliminary and upon coder review may  be revised to meet current compliance requirements. Elon Alas. Abbey Chatters, DO [place] Abbey Chatters, DO 02/06/2022 10:35:41 AM This report has been signed electronically. Number of Addenda: 0

## 2022-02-06 NOTE — Anesthesia Preprocedure Evaluation (Signed)
Anesthesia Evaluation  Patient identified by MRN, date of birth, ID band Patient awake    Reviewed: Allergy & Precautions, H&P , NPO status , Patient's Chart, lab work & pertinent test results, reviewed documented beta blocker date and time   Airway Mallampati: II  TM Distance: >3 FB Neck ROM: full    Dental no notable dental hx.    Pulmonary neg pulmonary ROS,    Pulmonary exam normal breath sounds clear to auscultation       Cardiovascular Exercise Tolerance: Good hypertension, negative cardio ROS   Rhythm:regular Rate:Normal     Neuro/Psych  Headaches, PSYCHIATRIC DISORDERS Anxiety Depression    GI/Hepatic Neg liver ROS, GERD  Medicated,  Endo/Other  negative endocrine ROS  Renal/GU negative Renal ROS  negative genitourinary   Musculoskeletal   Abdominal   Peds  Hematology negative hematology ROS (+)   Anesthesia Other Findings   Reproductive/Obstetrics negative OB ROS                             Anesthesia Physical Anesthesia Plan  ASA: 2  Anesthesia Plan: General   Post-op Pain Management:    Induction:   PONV Risk Score and Plan: Propofol infusion  Airway Management Planned:   Additional Equipment:   Intra-op Plan:   Post-operative Plan:   Informed Consent: I have reviewed the patients History and Physical, chart, labs and discussed the procedure including the risks, benefits and alternatives for the proposed anesthesia with the patient or authorized representative who has indicated his/her understanding and acceptance.     Dental Advisory Given  Plan Discussed with: CRNA  Anesthesia Plan Comments:         Anesthesia Quick Evaluation

## 2022-02-06 NOTE — H&P (Signed)
Primary Care Physician:  Kathyrn Drown, MD Primary Gastroenterologist:  Dr. Abbey Chatters  Pre-Procedure History & Physical: HPI:  [name] is a 61 y.o. female is here for a colonoscopy for colon cancer screening purposes.  Patient denies any family history of colorectal cancer.  No melena or hematochezia.  No abdominal pain or unintentional weight loss.  No change in bowel habits.  Overall feels well from a GI standpoint.  Past Medical History:  Diagnosis Date   Current use of estrogen therapy 02/24/2015   Depressed 04/21/2015   Gastroesophageal reflux disease    Hair loss 02/24/2015   Hot flashes 02/24/2015   Hypertension     Past Surgical History:  Procedure Laterality Date   CHOLECYSTECTOMY     COLONOSCOPY  07/04/2011   sessile polyp in the mid transverse colon/moderate diverticulosis in the sigmoid to descending colon segments/internal hemorrhoids   ESOPHAGOGASTRODUODENOSCOPY     retrograde ureteral stone extraction     Total abdominal hysterectomy with probable salpingo-oophorectomy  2006-8   HEAVY MENSES/? CANCER   TUBAL LIGATION      Prior to Admission medications   Medication Sig Start Date End Date Taking? Authorizing Provider  ALPRAZolam (XANAX) 0.5 MG tablet TAKE (1/2) TO (1) TABLET TWICE DAILY AS NEEDED. 01/04/22  Yes Luking, Scott A, MD  escitalopram (LEXAPRO) 20 MG tablet TAKE 1 TABLET BY MOUTH ONCE DAILY. 01/04/22  Yes Luking, Elayne Snare, MD  estradiol (VIVELLE-DOT) 0.0375 MG/24HR Place 1 patch onto the skin 2 (two) times a week.   Yes [provider]  fluticasone (FLONASE) 50 MCG/ACT nasal spray Place 2 sprays into both nostrils daily. Patient taking differently: Place 2 sprays into both nostrils daily as needed for allergies. 11/12/21  Yes Leath-Warren, Alda Lea, NP  ibuprofen (ADVIL) 200 MG tablet Take 200 mg by mouth every 6 (six) hours as needed for mild pain.   Yes [provider]  NIFEdipine (PROCARDIA-XL/NIFEDICAL-XL) 30 MG 24 hr tablet TAKE (1)  TABLET BY MOUTH ONCE DAILY. 01/27/22  Yes Luking, Elayne Snare, MD  Omega-3 1000 MG CAPS Take 1,000 mg by mouth daily.   Yes [provider]  OVER THE COUNTER MEDICATION Take 15 mLs by mouth at bedtime. Rejuvicare collagen liquid   Yes [provider]  Polyvinyl Alcohol-Povidone (REFRESH OP) Place 1 drop into both eyes daily as needed (dry eyes).   Yes [provider]  Probiotic Product (ALIGN) 4 MG CAPS Take 4 mg by mouth daily.   Yes [provider]  progesterone (PROMETRIUM) 100 MG capsule Take 200 mg by mouth See admin instructions. Take 200 mg 6 days a week skipping Sunday dose   Yes [provider]  cetirizine (ZYRTEC) 10 MG tablet Take 10 mg by mouth daily as needed for allergies.    [provider]  estradiol (VIVELLE-DOT) 0.025 MG/24HR Place 1 patch onto the skin 2 (two) times a week. Patient not taking: Reported on 01/30/2022 05/26/21   Glendale Chard, MD  VITAMIN D PO Take 1 capsule by mouth daily.    [provider]    Allergies as of 01/18/2022 - Review Complete 12/19/2021  Allergen Reaction Noted   Plaquenil [hydroxychloroquine sulfate]  11/04/2018   Hydrocodone Itching 10/30/2012   Lisinopril  10/30/2012   Metronidazole Hives 06/29/2011   Norvasc [amlodipine besylate]  12/09/2015    Family History  Problem Relation Age of Onset   Diabetes type II Mother    Coronary artery disease Mother    Diabetes type II  Father    Coronary artery disease Father    Hypertension Father    Diabetes type II Sister    Kidney disease Sister    Diabetes Brother    Thyroid disease Sister    Hypertension Sister    Hypertension Sister    Hypertension Brother    Colon cancer Neg Hx    Colon polyps Neg Hx    Breast cancer Neg Hx    Ovarian cancer Neg Hx    Uterine cancer Neg Hx    Ulcerative colitis Neg Hx    Celiac disease Neg Hx    Crohn's disease Neg Hx     Social History   Socioeconomic History   Marital status: Married     Spouse name: Not on file   Number of children: Not on file   Years of education: Not on file   Highest education level: Not on file  Occupational History   Not on file  Tobacco Use   Smoking status: Never   Smokeless tobacco: Never  Vaping Use   Vaping Use: Never used  Substance and Sexual Activity   Alcohol use: No   Drug use: No   Sexual activity: Yes    Birth control/protection: Surgical    Comment: hyst  Other Topics Concern   Not on file  Social History Narrative   Not on file   Social Determinants of Health   Financial Resource Strain: Not on file  Food Insecurity: Not on file  Transportation Needs: Not on file  Physical Activity: Not on file  Stress: Not on file  Social Connections: Not on file  Intimate Partner Violence: Not on file    Review of Systems: See HPI, otherwise negative ROS  Physical Exam: Vital signs in last 24 hours:     General:   Alert,  Well-developed, well-nourished, pleasant and cooperative in NAD Head:  Normocephalic and atraumatic. Eyes:  Sclera clear, no icterus.   Conjunctiva pink. Ears:  Normal auditory acuity. Nose:  No deformity, discharge,  or lesions. Mouth:  No deformity or lesions, dentition normal. Neck:  Supple; no masses or thyromegaly. Lungs:  Clear throughout to auscultation.   No wheezes, crackles, or rhonchi. No acute distress. Heart:  Regular rate and rhythm; no murmurs, clicks, rubs,  or gallops. Abdomen:  Soft, nontender and nondistended. No masses, hepatosplenomegaly or hernias noted. Normal bowel sounds, without guarding, and without rebound.   Msk:  Symmetrical without gross deformities. Normal posture. Extremities:  Without clubbing or edema. Neurologic:  Alert and  oriented x4;  grossly normal neurologically. Skin:  Intact without significant lesions or rashes. Cervical Nodes:  No significant cervical adenopathy. Psych:  Alert and cooperative. Normal mood and affect.  Impression/Plan: [name] is  here for a colonoscopy to be performed for colon cancer screening purposes.  The risks of the procedure including infection, bleed, or perforation as well as benefits, limitations, alternatives and imponderables have been reviewed with the patient. Questions have been answered. All parties agreeable.

## 2022-02-07 LAB — SURGICAL PATHOLOGY

## 2022-02-13 ENCOUNTER — Encounter (HOSPITAL_COMMUNITY): Payer: Self-pay | Admitting: Internal Medicine

## 2022-02-13 NOTE — Addendum Note (Signed)
Addendum  created 02/13/22 1253 by Karna Dupes, CRNA   Intraprocedure Staff edited

## 2022-02-14 ENCOUNTER — Other Ambulatory Visit: Payer: Self-pay | Admitting: Internal Medicine

## 2022-03-02 ENCOUNTER — Other Ambulatory Visit: Payer: Self-pay | Admitting: Family Medicine

## 2022-03-13 ENCOUNTER — Telehealth: Payer: PRIVATE HEALTH INSURANCE | Admitting: Physician Assistant

## 2022-03-13 DIAGNOSIS — J019 Acute sinusitis, unspecified: Secondary | ICD-10-CM

## 2022-03-13 DIAGNOSIS — B9689 Other specified bacterial agents as the cause of diseases classified elsewhere: Secondary | ICD-10-CM

## 2022-03-13 MED ORDER — AMOXICILLIN-POT CLAVULANATE 875-125 MG PO TABS: 1.0000 | ORAL_TABLET | Freq: Two times a day (BID) | ORAL | 0 refills | Status: DC

## 2022-03-13 MED ORDER — BENZONATATE 100 MG PO CAPS: 100.0000 mg | ORAL_CAPSULE | Freq: Three times a day (TID) | ORAL | 0 refills | Status: DC | PRN

## 2022-03-13 NOTE — Progress Notes (Signed)
Virtual Visit Consent   [name], you are scheduled for a virtual visit with a [facility] provider today. Just as with appointments in the office, your consent must be obtained to participate. Your consent will be active for this visit and any virtual visit you may have with one of our providers in the next 365 days. If you have a MyChart account, a copy of this consent can be sent to you electronically.  As this is a virtual visit, video technology does not allow for your provider to perform a traditional examination. This may limit your provider's ability to fully assess your condition. If your provider identifies any concerns that need to be evaluated in person or the need to arrange testing (such as labs, EKG, etc.), we will make arrangements to do so. Although advances in technology are sophisticated, we cannot ensure that it will always work on either your end or our end. If the connection with a video visit is poor, the visit may have to be switched to a telephone visit. With either a video or telephone visit, we are not always able to ensure that we have a secure connection.  By engaging in this virtual visit, you consent to the provision of healthcare and authorize for your insurance to be billed (if applicable) for the services provided during this visit. Depending on your insurance coverage, you may receive a charge related to this service.  I need to obtain your verbal consent now. Are you willing to proceed with your visit today? [name] has provided verbal consent on 03/13/2022 for a virtual visit (video or telephone). Mar Daring, PA-C  Date: 03/13/2022 12:23 PM  Virtual Visit via Video Note   I, Mar Daring, connected with  [name]  ([id number], 17-Mar-1961) on 03/13/22 at 12:15 PM EDT by a video-enabled telemedicine application and verified that I am speaking with the correct person using two identifiers.  Location: Patient: Virtual Visit  Location Patient: Home Provider: Virtual Visit Location Provider: Home Office   I discussed the limitations of evaluation and management by telemedicine and the availability of in person appointments. The patient expressed understanding and agreed to proceed.    History of Present Illness: [name] is a 61 y.o. who identifies as a female who was assigned female at birth, and is being seen today for possible sinus infection.  HPI: Sinusitis This is a new problem. The current episode started in the past 7 days. The problem has been gradually worsening since onset. There has been no fever. Associated symptoms include congestion, coughing, ear pain (very minor), a hoarse voice, sinus pressure and a sore throat (on friday, but now cleared). Pertinent negatives include no chills or headaches. Treatments tried: zyrtec and robitussin. The treatment provided no relief.      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   Anxiety and depression 09/06/2015   Chronic fatigue 09/06/2015   Elevated ferritin 08/08/2015   Prediabetes 04/30/2015   Depressed 04/21/2015   Current use of estrogen  therapy 02/24/2015   Hair loss 02/24/2015   Hot flashes 02/24/2015   Peripheral edema 11/19/2014   Essential hypertension, benign 10/25/2012   Epigastric abdominal pain 11/01/2011   Abdominal pain 06/29/2011    Allergies:  Allergies  Allergen Reactions   Hydrocodone Itching   Lisinopril     Headache and  dizziness   Metronidazole Hives   Norvasc [Amlodipine Besylate]     Unknown reaction    Plaquenil [Hydroxychloroquine Sulfate] Hives and Itching   Medications:  Current Outpatient Medications:    amoxicillin-clavulanate (AUGMENTIN) 875-125 MG tablet, Take 1 tablet by mouth 2 (two) times daily., Disp: 14 tablet, Rfl: 0   benzonatate (TESSALON) 100 MG capsule, Take 1 capsule (100 mg total) by mouth 3 (three) times daily as needed., Disp: 30 capsule, Rfl: 0   ALPRAZolam (XANAX) 0.5 MG tablet, TAKE (1/2) TO (1) TABLET TWICE DAILY AS NEEDED., Disp: 30 tablet, Rfl: 0   cetirizine (ZYRTEC) 10 MG tablet, Take 10 mg by mouth daily as needed for allergies., Disp: , Rfl:    escitalopram (LEXAPRO) 20 MG tablet, TAKE 1 TABLET BY MOUTH ONCE DAILY., Disp: 90 tablet, Rfl: 0   estradiol (VIVELLE-DOT) 0.025 MG/24HR, Place 1 patch onto the skin 2 (two) times a week. (Patient not taking: Reported on 01/30/2022), Disp: 8 patch, Rfl: 2   estradiol (VIVELLE-DOT) 0.0375 MG/24HR, PLACE 1 PATCH ONTO THE SKIN TWICE A WEEK, Disp: 24 patch, Rfl: 0   fluticasone (FLONASE) 50 MCG/ACT nasal spray, Place 2 sprays into both nostrils daily. (Patient taking differently: Place 2 sprays into both nostrils daily as needed for allergies.), Disp: 16 g, Rfl: 0   ibuprofen (ADVIL) 200 MG tablet, Take 200 mg by mouth every 6 (six) hours as needed for mild pain., Disp: , Rfl:    NIFEdipine (PROCARDIA-XL/NIFEDICAL-XL) 30 MG 24 hr tablet, TAKE (1) TABLET BY MOUTH ONCE DAILY., Disp: 30 tablet, Rfl: 0   Omega-3 1000 MG CAPS, Take 1,000 mg by mouth daily., Disp: , Rfl:    OVER THE COUNTER MEDICATION, Take 15 mLs by mouth at bedtime. Rejuvicare collagen liquid, Disp: , Rfl:    Polyvinyl Alcohol-Povidone (REFRESH OP), Place 1 drop into both eyes daily as needed (dry eyes)., Disp: , Rfl:    Probiotic Product (ALIGN) 4 MG CAPS, Take 4 mg by mouth daily., Disp: , Rfl:    progesterone (PROMETRIUM) 100 MG capsule, Take 200 mg by mouth See admin  instructions. Take 200 mg 6 days a week skipping Sunday dose, Disp: , Rfl:    VITAMIN D PO, Take 1 capsule by mouth daily., Disp: , Rfl:   Observations/Objective: Patient is well-developed, well-nourished in no acute distress.  Resting comfortably at home.  Head is normocephalic, atraumatic.  No labored breathing.  Speech is clear and coherent with logical content.  Patient is alert and oriented at baseline.    Assessment and Plan: 1. Acute bacterial sinusitis - amoxicillin-clavulanate (AUGMENTIN) 875-125 MG tablet; Take 1 tablet by mouth 2 (two) times daily.  Dispense: 14 tablet; Refill: 0 - benzonatate (TESSALON) 100 MG capsule; Take 1 capsule (100 mg total) by mouth 3 (three) times daily as needed.  Dispense: 30 capsule; Refill: 0  - Worsening symptoms that have not responded to OTC medications.  - Will give Augmentin and Tessalon - Continue allergy medications.  - Steam and humidifier can help - Stay well hydrated and get plenty of rest.  - Seek in person evaluation if no symptom improvement or if symptoms worsen   Follow Up  Instructions: I discussed the assessment and treatment plan with the patient. The patient was provided an opportunity to ask questions and all were answered. The patient agreed with the plan and demonstrated an understanding of the instructions.  A copy of instructions were sent to the patient via MyChart unless otherwise noted below.    The patient was advised to call back or seek an in-person evaluation if the symptoms worsen or if the condition fails to improve as anticipated.  Time:  I spent 10 minutes with the patient via telehealth technology discussing the above problems/concerns.    Mar Daring, PA-C

## 2022-03-13 NOTE — Patient Instructions (Signed)
[name], thank you for joining Mar Daring, PA-C for today's virtual visit.  While this provider is not your primary care provider (PCP), if your PCP is located in our provider database this encounter information will be shared with them immediately following your visit.  Consent: (Patient) [name] provided verbal consent for this virtual visit at the beginning of the encounter.  Current Medications:  Current Outpatient Medications:    amoxicillin-clavulanate (AUGMENTIN) 875-125 MG tablet, Take 1 tablet by mouth 2 (two) times daily., Disp: 14 tablet, Rfl: 0   benzonatate (TESSALON) 100 MG capsule, Take 1 capsule (100 mg total) by mouth 3 (three) times daily as needed., Disp: 30 capsule, Rfl: 0   ALPRAZolam (XANAX) 0.5 MG tablet, TAKE (1/2) TO (1) TABLET TWICE DAILY AS NEEDED., Disp: 30 tablet, Rfl: 0   cetirizine (ZYRTEC) 10 MG tablet, Take 10 mg by mouth daily as needed for allergies., Disp: , Rfl:    escitalopram (LEXAPRO) 20 MG tablet, TAKE 1 TABLET BY MOUTH ONCE DAILY., Disp: 90 tablet, Rfl: 0   estradiol (VIVELLE-DOT) 0.025 MG/24HR, Place 1 patch onto the skin 2 (two) times a week. (Patient not taking: Reported on 01/30/2022), Disp: 8 patch, Rfl: 2   estradiol (VIVELLE-DOT) 0.0375 MG/24HR, PLACE 1 PATCH ONTO THE SKIN TWICE A WEEK, Disp: 24 patch, Rfl: 0   fluticasone (FLONASE) 50 MCG/ACT nasal spray, Place 2 sprays into both nostrils daily. (Patient taking differently: Place 2 sprays into both nostrils daily as needed for allergies.), Disp: 16 g, Rfl: 0   ibuprofen (ADVIL) 200 MG tablet, Take 200 mg by mouth every 6 (six) hours as needed for mild pain., Disp: , Rfl:    NIFEdipine (PROCARDIA-XL/NIFEDICAL-XL) 30 MG 24 hr tablet, TAKE (1) TABLET BY MOUTH ONCE DAILY., Disp: 30 tablet, Rfl: 0   Omega-3 1000 MG CAPS, Take 1,000 mg by mouth daily., Disp: , Rfl:    OVER THE COUNTER MEDICATION, Take 15 mLs by mouth at bedtime. Rejuvicare collagen liquid, Disp: , Rfl:     Polyvinyl Alcohol-Povidone (REFRESH OP), Place 1 drop into both eyes daily as needed (dry eyes)., Disp: , Rfl:    Probiotic Product (ALIGN) 4 MG CAPS, Take 4 mg by mouth daily., Disp: , Rfl:    progesterone (PROMETRIUM) 100 MG capsule, Take 200 mg by mouth See admin instructions. Take 200 mg 6 days a week skipping Sunday dose, Disp: , Rfl:    VITAMIN D PO, Take 1 capsule by mouth daily., Disp: , Rfl:    Medications ordered in this encounter:  Meds ordered this encounter  Medications   amoxicillin-clavulanate (AUGMENTIN) 875-125 MG tablet    Sig: Take 1 tablet by mouth 2 (two) times daily.    Dispense:  14 tablet    Refill:  0    Order Specific Question:   Supervising Provider    Answer:   Chase Picket [[id number]]   benzonatate (TESSALON) 100 MG capsule    Sig: Take 1 capsule (100 mg total) by mouth 3 (three) times daily as needed.    Dispense:  30 capsule    Refill:  0    Order Specific Question:   Supervising Provider    Answer:   Chase Picket [id number]     *If you need refills on other medications prior to your next appointment, please contact your pharmacy*  Follow-Up: Call back or seek an in-person evaluation if the symptoms worsen or if the condition fails to improve as anticipated.  [facility] [phone]  Other Instructions Sinus Infection, Adult A sinus infection, also called sinusitis, is inflammation of your sinuses. Sinuses are hollow spaces in the bones around your face. Your sinuses are located: Around your eyes. In the middle of your forehead. Behind your nose. In your cheekbones. Mucus normally drains out of your sinuses. When your nasal tissues become inflamed or swollen, mucus can become trapped or blocked. This allows bacteria, viruses, and fungi to grow, which leads to infection. Most infections of the sinuses are caused by a virus. A sinus infection can develop quickly. It can last for up to 4 weeks (acute) or for more than 12 weeks  (chronic). A sinus infection often develops after a cold. What are the causes? This condition is caused by anything that creates swelling in the sinuses or stops mucus from draining. This includes: Allergies. Asthma. Infection from bacteria or viruses. Deformities or blockages in your nose or sinuses. Abnormal growths in the nose (nasal polyps). Pollutants, such as chemicals or irritants in the air. Infection from fungi. This is rare. What increases the risk? You are more likely to develop this condition if you: Have a weak body defense system (immune system). Do a lot of swimming or diving. Overuse nasal sprays. Smoke. What are the signs or symptoms? The main symptoms of this condition are pain and a feeling of pressure around the affected sinuses. Other symptoms include: Stuffy nose or congestion that makes it difficult to breathe through your nose. Thick yellow or greenish drainage from your nose. Tenderness, swelling, and warmth over the affected sinuses. A cough that may get worse at night. Decreased sense of smell and taste. Extra mucus that collects in the throat or the back of the nose (postnasal drip) causing a sore throat or bad breath. Tiredness (fatigue). Fever. How is this diagnosed? This condition is diagnosed based on: Your symptoms. Your medical history. A physical exam. Tests to find out if your condition is acute or chronic. This may include: Checking your nose for nasal polyps. Viewing your sinuses using a device that has a light (endoscope). Testing for allergies or bacteria. Imaging tests, such as an MRI or CT scan. In rare cases, a bone biopsy may be done to rule out more serious types of fungal sinus disease. How is this treated? Treatment for a sinus infection depends on the cause and whether your condition is chronic or acute. If caused by a virus, your symptoms should go away on their own within 10 days. You may be given medicines to relieve symptoms.  They include: Medicines that shrink swollen nasal passages (decongestants). A spray that eases inflammation of the nostrils (topical intranasal corticosteroids). Rinses that help get rid of thick mucus in your nose (nasal saline washes). Medicines that treat allergies (antihistamines). Over-the-counter pain relievers. If caused by bacteria, your health care provider may recommend waiting to see if your symptoms improve. Most bacterial infections will get better without antibiotic medicine. You may be given antibiotics if you have: A severe infection. A weak immune system. If caused by narrow nasal passages or nasal polyps, surgery may be needed. Follow these instructions at home: Medicines Take, use, or apply over-the-counter and prescription medicines only as told by your health care provider. These may include nasal sprays. If you were prescribed an antibiotic medicine, take it as told by your health care provider. Do not stop taking the antibiotic even if you start to feel better. Hydrate and humidify  Drink enough fluid to keep  your urine pale yellow. Staying hydrated will help to thin your mucus. Use a cool mist humidifier to keep the humidity level in your home above 50%. Inhale steam for 10-15 minutes, 3-4 times a day, or as told by your health care provider. You can do this in the bathroom while a hot shower is running. Limit your exposure to cool or dry air. Rest Rest as much as possible. Sleep with your head raised (elevated). Make sure you get enough sleep each night. General instructions  Apply a warm, moist washcloth to your face 3-4 times a day or as told by your health care provider. This will help with discomfort. Use nasal saline washes as often as told by your health care provider. Wash your hands often with soap and water to reduce your exposure to germs. If soap and water are not available, use hand sanitizer. Do not smoke. Avoid being around people who are smoking  (secondhand smoke). Keep all follow-up visits. This is important. Contact a health care provider if: You have a fever. Your symptoms get worse. Your symptoms do not improve within 10 days. Get help right away if: You have a severe headache. You have persistent vomiting. You have severe pain or swelling around your face or eyes. You have vision problems. You develop confusion. Your neck is stiff. You have trouble breathing. These symptoms may be an emergency. Get help right away. Call 911. Do not wait to see if the symptoms will go away. Do not drive yourself to the hospital. Summary A sinus infection is soreness and inflammation of your sinuses. Sinuses are hollow spaces in the bones around your face. This condition is caused by nasal tissues that become inflamed or swollen. The swelling traps or blocks the flow of mucus. This allows bacteria, viruses, and fungi to grow, which leads to infection. If you were prescribed an antibiotic medicine, take it as told by your health care provider. Do not stop taking the antibiotic even if you start to feel better. Keep all follow-up visits. This is important. This information is not intended to replace advice given to you by your health care provider. Make sure you discuss any questions you have with your health care provider. Document Revised: 05/10/2021 Document Reviewed: 05/10/2021 Elsevier Patient Education  [place].    If you have been instructed to have an in-person evaluation today at a local Urgent Care facility, please use the link below. It will take you to a list of all of our available [facility], including address, phone number and hours of operation. Please do not delay care.  [facility]  If you or a family member do not have a primary care provider, use the link below to schedule a visit and establish care. When you choose a [facility] primary care physician or advanced practice provider,  you gain a long-term partner in health. Find a Primary Care Provider  Learn more about [facility] in-office and virtual care options: [facility]

## 2022-03-25 ENCOUNTER — Telehealth: Payer: PRIVATE HEALTH INSURANCE | Admitting: Family Medicine

## 2022-03-25 DIAGNOSIS — J40 Bronchitis, not specified as acute or chronic: Secondary | ICD-10-CM | POA: Diagnosis not present

## 2022-03-25 MED ORDER — AZITHROMYCIN 250 MG PO TABS: ORAL_TABLET | ORAL | 0 refills | Status: AC

## 2022-03-25 MED ORDER — PREDNISONE 20 MG PO TABS: 20.0000 mg | ORAL_TABLET | Freq: Two times a day (BID) | ORAL | 0 refills | Status: AC

## 2022-03-25 NOTE — Progress Notes (Signed)
Virtual Visit Consent   [name], you are scheduled for a virtual visit with a [facility] provider today. Just as with appointments in the office, your consent must be obtained to participate. Your consent will be active for this visit and any virtual visit you may have with one of our providers in the next 365 days. If you have a MyChart account, a copy of this consent can be sent to you electronically.  As this is a virtual visit, video technology does not allow for your provider to perform a traditional examination. This may limit your provider's ability to fully assess your condition. If your provider identifies any concerns that need to be evaluated in person or the need to arrange testing (such as labs, EKG, etc.), we will make arrangements to do so. Although advances in technology are sophisticated, we cannot ensure that it will always work on either your end or our end. If the connection with a video visit is poor, the visit may have to be switched to a telephone visit. With either a video or telephone visit, we are not always able to ensure that we have a secure connection.  By engaging in this virtual visit, you consent to the provision of healthcare and authorize for your insurance to be billed (if applicable) for the services provided during this visit. Depending on your insurance coverage, you may receive a charge related to this service.  I need to obtain your verbal consent now. Are you willing to proceed with your visit today? BRITTANE GRUDZINSKI has provided verbal consent on 03/25/2022 for a virtual visit (video or telephone). Dellia Nims, FNP  Date: 03/25/2022 10:41 AM  Virtual Visit via Video Note   I, Dellia Nims, connected with  [name]  ([id number], Feb 19, 1961) on 03/25/22 at 10:45 AM EDT by a video-enabled telemedicine application and verified that I am speaking with the correct person using two identifiers.  Location: Patient: Virtual Visit Location Patient:  Home Provider: Virtual Visit Location Provider: Home Office   I discussed the limitations of evaluation and management by telemedicine and the availability of in person appointments. The patient expressed understanding and agreed to proceed.    History of Present Illness: [name] is a 61 y.o. who identifies as a female who was assigned female at birth, and is being seen today for persistent cough that did not resolve with augmentin. She says she is wheezing at times. Denies fever, sob. In no distress. 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   Anxiety and depression 09/06/2015   Chronic fatigue 09/06/2015   Elevated ferritin 08/08/2015   Prediabetes 04/30/2015   Depressed 04/21/2015   Current use of estrogen therapy 02/24/2015   Hair loss 02/24/2015   Hot flashes 02/24/2015   Peripheral edema 11/19/2014   Essential hypertension, benign 10/25/2012   Epigastric abdominal pain 11/01/2011   Abdominal pain 06/29/2011    Allergies:  Allergies  Allergen Reactions   Hydrocodone Itching   Lisinopril  Headache and dizziness   Metronidazole Hives   Norvasc [Amlodipine Besylate]     Unknown reaction    Plaquenil [Hydroxychloroquine Sulfate] Hives and Itching   Medications:  Current Outpatient Medications:    azithromycin (ZITHROMAX) 250 MG tablet, Take 2 tablets on day 1, then 1 tablet daily on days 2 through 5, Disp: 6 tablet, Rfl: 0   predniSONE (DELTASONE) 20  MG tablet, Take 1 tablet (20 mg total) by mouth 2 (two) times daily with a meal for 5 days., Disp: 10 tablet, Rfl: 0   ALPRAZolam (XANAX) 0.5 MG tablet, TAKE (1/2) TO (1) TABLET TWICE DAILY AS NEEDED., Disp: 30 tablet, Rfl: 0   amoxicillin-clavulanate (AUGMENTIN) 875-125 MG tablet, Take 1 tablet by mouth 2 (two) times daily., Disp: 14 tablet, Rfl: 0   benzonatate (TESSALON) 100 MG capsule, Take 1 capsule (100 mg total) by mouth 3 (three) times daily as needed., Disp: 30 capsule, Rfl: 0   cetirizine (ZYRTEC) 10 MG tablet, Take 10 mg by mouth daily as needed for allergies., Disp: , Rfl:    escitalopram (LEXAPRO) 20 MG tablet, TAKE 1 TABLET BY MOUTH ONCE DAILY., Disp: 90 tablet, Rfl: 0   estradiol (VIVELLE-DOT) 0.025 MG/24HR, Place 1 patch onto the skin 2 (two) times a week. (Patient not taking: Reported on 01/30/2022), Disp: 8 patch, Rfl: 2   estradiol (VIVELLE-DOT) 0.0375 MG/24HR, PLACE 1 PATCH ONTO THE SKIN TWICE A WEEK, Disp: 24 patch, Rfl: 0   fluticasone (FLONASE) 50 MCG/ACT nasal spray, Place 2 sprays into both nostrils daily. (Patient taking differently: Place 2 sprays into both nostrils daily as needed for allergies.), Disp: 16 g, Rfl: 0   ibuprofen (ADVIL) 200 MG tablet, Take 200 mg by mouth every 6 (six) hours as needed for mild pain., Disp: , Rfl:    NIFEdipine (PROCARDIA-XL/NIFEDICAL-XL) 30 MG 24 hr tablet, TAKE (1) TABLET BY MOUTH ONCE DAILY., Disp: 30 tablet, Rfl: 0   Omega-3 1000 MG CAPS, Take 1,000 mg by mouth daily., Disp: , Rfl:    OVER THE COUNTER MEDICATION, Take 15 mLs by mouth at bedtime. Rejuvicare collagen liquid, Disp: , Rfl:    Polyvinyl Alcohol-Povidone (REFRESH OP), Place 1 drop into both eyes daily as needed (dry eyes)., Disp: , Rfl:    Probiotic Product (ALIGN) 4 MG CAPS, Take 4 mg by mouth daily., Disp: , Rfl:    progesterone (PROMETRIUM) 100 MG capsule, Take 200 mg by mouth See admin instructions. Take 200 mg 6 days a week skipping Sunday dose, Disp: , Rfl:    VITAMIN D  PO, Take 1 capsule by mouth daily., Disp: , Rfl:   Observations/Objective: Patient is well-developed, well-nourished in no acute distress.  Resting comfortably  at home.  Head is normocephalic, atraumatic.  No labored breathing.  Speech is clear and coherent with logical content.  Patient is alert and oriented at baseline.    Assessment and Plan: 1. Bronchitis  Increase fluids, humidifier at night, med use and side effects discussed, urgent care if sx persist or worsen for cxr.   Follow Up Instructions: I discussed the assessment and treatment plan with the patient. The patient was provided an opportunity to ask questions and all were answered. The patient agreed with the plan and demonstrated an understanding of the instructions.  A copy of instructions were sent to the patient via MyChart unless otherwise noted below.     The patient was advised to call back or seek an in-person evaluation if the symptoms worsen or if the  condition fails to improve as anticipated.  Time:  I spent 10 minutes with the patient via telehealth technology discussing the above problems/concerns.    Dellia Nims, FNP

## 2022-03-25 NOTE — Patient Instructions (Signed)

## 2022-04-07 ENCOUNTER — Ambulatory Visit (INDEPENDENT_AMBULATORY_CARE_PROVIDER_SITE_OTHER): Payer: PRIVATE HEALTH INSURANCE | Admitting: Nurse Practitioner

## 2022-04-07 VITALS — BP 139/89 | Ht 66.0 in | Wt 184.4 lb

## 2022-04-07 DIAGNOSIS — I10 Essential (primary) hypertension: Secondary | ICD-10-CM | POA: Diagnosis not present

## 2022-04-07 DIAGNOSIS — R609 Edema, unspecified: Secondary | ICD-10-CM

## 2022-04-07 DIAGNOSIS — Z23 Encounter for immunization: Secondary | ICD-10-CM | POA: Diagnosis not present

## 2022-04-07 DIAGNOSIS — F419 Anxiety disorder, unspecified: Secondary | ICD-10-CM

## 2022-04-07 DIAGNOSIS — F32A Depression, unspecified: Secondary | ICD-10-CM

## 2022-04-07 DIAGNOSIS — M5441 Lumbago with sciatica, right side: Secondary | ICD-10-CM

## 2022-04-07 MED ORDER — HYDROCHLOROTHIAZIDE 25 MG PO TABS: 25.0000 mg | ORAL_TABLET | Freq: Every day | ORAL | 0 refills | Status: DC

## 2022-04-07 MED ORDER — NAPROXEN 500 MG PO TABS: 500.0000 mg | ORAL_TABLET | Freq: Two times a day (BID) | ORAL | 0 refills | Status: DC

## 2022-04-07 MED ORDER — NIFEDIPINE ER OSMOTIC RELEASE 30 MG PO TB24: ORAL_TABLET | ORAL | 0 refills | Status: DC

## 2022-04-07 MED ORDER — ESCITALOPRAM OXALATE 20 MG PO TABS
20.0000 mg | ORAL_TABLET | Freq: Every day | ORAL | 0 refills | Status: DC
Start: 2022-04-07 — End: 2022-07-05

## 2022-04-07 NOTE — Patient Instructions (Signed)
Will send in orders for your labs; please get these before your physical. Thanks.

## 2022-04-07 NOTE — Progress Notes (Unsigned)
Subjective:    Patient ID: [name], female    DOB: 10/24/1960, 61 y.o.   MRN: [id number]  HPI Patient arrives with right hip pain for about a week- feels like sciatica with the pain shooting down her leg Patient presents with a flareup of her low back pain.  Has had this off and on for years.  Had injections in her back a few years ago which helped greatly.  Pain began about a week ago.  Starts in her right lumbar area going around lateral hip down the leg to the foot.  States the leg feels like it is going to "give way" sometimes but denies any weakness or numbness of the leg.  Patient thinks the pain may be triggered by lifting a child that weighs about 50 pounds.  Has been taking extra strength Tylenol with minimal relief.  No change in bowel or bladder habits. In addition it was noted patient had some swelling in her feet.  States this has been going on a long time.  Denies any orthopnea.  No chest pain/ischemic type pain shortness of breath or unusual cough.  Denies any change in her diet or excessive sodium intake. Continues to take escitalopram for her anxiety.  Has been under more stress lately.  Overall this seems to be working well. Also requesting a refill on her nifedipine for her blood pressure.  Review of Systems  Constitutional:  Positive for fatigue.  Respiratory:  Negative for cough, chest tightness, shortness of breath and wheezing.   Cardiovascular:  Positive for leg swelling. Negative for chest pain and palpitations.  Musculoskeletal:  Positive for back pain.  Psychiatric/Behavioral:  The patient is nervous/anxious.       04/07/2022    1:37 PM  Depression screen PHQ 2/9  Decreased Interest 0  Down, Depressed, Hopeless 0  PHQ - 2 Score 0       Objective:   Physical Exam NAD.  Alert, oriented.  Making good eye contact.  Dressed appropriately for the weather.  Speech clear.  Thoughts logical coherent and relevant.  Calm cheerful affect.  Lungs clear.  Heart  regular rate rhythm.  No murmur noted.  Distinct tenderness noted in the right lower lumbar area radiating into the buttock.  SLR positive bilaterally more so on the right side.  Reflexes normal limit lower extremities. Gets on and off table with minimal difficulty.  Gait slow but steady.  Lower extremities 1+ pitting edema. Today's Vitals   04/07/22 1336  BP: 139/89  Weight: 184 lb 6.4 oz (83.6 kg)  Height: '5\' 6"'$  (1.676 m)   Body mass index is 29.76 kg/m. Weight stable.       Assessment & Plan:   Problem List Items Addressed This Visit       Cardiovascular and Mediastinum   Essential hypertension, benign   Relevant Medications   NIFEdipine (PROCARDIA-XL/NIFEDICAL-XL) 30 MG 24 hr tablet   hydrochlorothiazide (HYDRODIURIL) 25 MG tablet     Nervous and Auditory   Acute right-sided low back pain with right-sided sciatica - Primary   Relevant Medications   escitalopram (LEXAPRO) 20 MG tablet   naproxen (NAPROSYN) 500 MG tablet     Other   Anxiety and depression   Relevant Medications   escitalopram (LEXAPRO) 20 MG tablet   Peripheral edema   Other Visit Diagnoses     Need for vaccination       Relevant Orders   Flu Vaccine QUAD 73moIM (Fluarix, Fluzone & Alfiuria [employer]  PF) (Completed)      Meds ordered this encounter  Medications   escitalopram (LEXAPRO) 20 MG tablet    Sig: Take 1 tablet (20 mg total) by mouth daily.    Dispense:  90 tablet    Refill:  0    Order Specific Question:   Supervising Provider    Answer:   Sallee Lange A [[id number]]   NIFEdipine (PROCARDIA-XL/NIFEDICAL-XL) 30 MG 24 hr tablet    Sig: TAKE (1) TABLET BY MOUTH ONCE DAILY.    Dispense:  90 tablet    Refill:  0    Order Specific Question:   Supervising Provider    Answer:   Sallee Lange A [[id number]]   naproxen (NAPROSYN) 500 MG tablet    Sig: Take 1 tablet (500 mg total) by mouth 2 (two) times daily with a meal. Prn pain    Dispense:  30 tablet    Refill:  0    Order Specific Question:    Supervising Provider    Answer:   Sallee Lange A [[id number]]   hydrochlorothiazide (HYDRODIURIL) 25 MG tablet    Sig: Take 1 tablet (25 mg total) by mouth daily. Prn swelling    Dispense:  90 tablet    Refill:  0    Order Specific Question:   Supervising Provider    Answer:   Sallee Lange A [[id number]]   Refills on escitalopram and nifedipine.  Add HCTZ to her regimen as needed edema.  Encouraged regular walking program when her back is improved as well as weight loss. Discussed options regarding her back pain.  Patient defers use of prednisone due to side effects.  Considered amitriptyline but some concerns using this with the Lexapro. Start naproxen twice daily with food as directed as needed pain.  Ice/heat applications.  Stretching exercises.  Avoid heavy lifting more than 5 pounds.  Patient to call back next week if no improvement, recommend she gets back with her specialist for possible injections if pain persist.  Warning signs reviewed.  Seek help if symptoms worsen. Flu vaccine today.  Reminded patient about annual exam.

## 2022-04-08 ENCOUNTER — Encounter: Payer: Self-pay | Admitting: Nurse Practitioner

## 2022-04-08 DIAGNOSIS — M5441 Lumbago with sciatica, right side: Secondary | ICD-10-CM | POA: Insufficient documentation

## 2022-05-01 ENCOUNTER — Encounter: Payer: Self-pay | Admitting: Internal Medicine

## 2022-05-01 ENCOUNTER — Ambulatory Visit (INDEPENDENT_AMBULATORY_CARE_PROVIDER_SITE_OTHER): Payer: PRIVATE HEALTH INSURANCE | Admitting: Internal Medicine

## 2022-05-01 VITALS — BP 120/66 | HR 85 | Temp 98.1°F | Ht 66.0 in | Wt 187.2 lb

## 2022-05-01 DIAGNOSIS — I10 Essential (primary) hypertension: Secondary | ICD-10-CM | POA: Diagnosis not present

## 2022-05-01 DIAGNOSIS — N951 Menopausal and female climacteric states: Secondary | ICD-10-CM | POA: Diagnosis not present

## 2022-05-01 DIAGNOSIS — R7309 Other abnormal glucose: Secondary | ICD-10-CM | POA: Diagnosis not present

## 2022-05-01 DIAGNOSIS — Z6830 Body mass index (BMI) 30.0-30.9, adult: Secondary | ICD-10-CM | POA: Diagnosis not present

## 2022-05-01 MED ORDER — ESTRADIOL 0.025 MG/24HR TD PTTW: 1.0000 | MEDICATED_PATCH | TRANSDERMAL | 0 refills | Status: DC

## 2022-05-01 NOTE — Patient Instructions (Signed)
Exercising to Stay Healthy To become healthy and stay healthy, it is recommended that you do moderate-intensity and vigorous-intensity exercise. You can tell that you are exercising at a moderate intensity if your heart starts beating faster and you start breathing faster but can still hold a conversation. You can tell that you are exercising at a vigorous intensity if you are breathing much harder and faster and cannot hold a conversation while exercising. How can exercise benefit me? Exercising regularly is important. It has many health benefits, such as: Improving overall fitness, flexibility, and endurance. Increasing bone density. Helping with weight control. Decreasing body fat. Increasing muscle strength and endurance. Reducing stress and tension, anxiety, depression, or anger. Improving overall health. What guidelines should I follow while exercising? Before you start a new exercise program, talk with your health care provider. Do not exercise so much that you hurt yourself, feel dizzy, or get very short of breath. Wear comfortable clothes and wear shoes with good support. Drink plenty of water while you exercise to prevent dehydration or heat stroke. Work out until your breathing and your heartbeat get faster (moderate intensity). How often should I exercise? Choose an activity that you enjoy, and set realistic goals. Your health care provider can help you make an activity plan that is individually designed and works best for you. Exercise regularly as told by your health care provider. This may include: Doing strength training two times a week, such as: Lifting weights. Using resistance bands. Push-ups. Sit-ups. Yoga. Doing a certain intensity of exercise for a given amount of time. Choose from these options: A total of 150 minutes of moderate-intensity exercise every week. A total of 75 minutes of vigorous-intensity exercise every week. A mix of moderate-intensity and  vigorous-intensity exercise every week. Children, pregnant women, people who have not exercised regularly, people who are overweight, and older adults may need to talk with a health care provider about what activities are safe to perform. If you have a medical condition, be sure to talk with your health care provider before you start a new exercise program. What are some exercise ideas? Moderate-intensity exercise ideas include: Walking 1 mile (1.6 km) in about 15 minutes. Biking. Hiking. Golfing. Dancing. Water aerobics. Vigorous-intensity exercise ideas include: Walking 4.5 miles (7.2 km) or more in about 1 hour. Jogging or running 5 miles (8 km) in about 1 hour. Biking 10 miles (16.1 km) or more in about 1 hour. Lap swimming. Roller-skating or in-line skating. Cross-country skiing. Vigorous competitive sports, such as football, basketball, and soccer. Jumping rope. Aerobic dancing. What are some everyday activities that can help me get exercise? Yard work, such as: Pushing a lawn mower. Raking and bagging leaves. Washing your car. Pushing a stroller. Shoveling snow. Gardening. Washing windows or floors. How can I be more active in my day-to-day activities? Use stairs instead of an elevator. Take a walk during your lunch break. If you drive, park your car farther away from your work or school. If you take public transportation, get off one stop early and walk the rest of the way. Stand up or walk around during all of your indoor phone calls. Get up, stretch, and walk around every 30 minutes throughout the day. Enjoy exercise with a friend. Support to continue exercising will help you keep a regular routine of activity. Where to find more information You can find more information about exercising to stay healthy from: U.S. Department of Health and Human Services: www.hhs.gov Centers for Disease Control and Prevention (  CDC): www.cdc.gov Summary Exercising regularly is  important. It will improve your overall fitness, flexibility, and endurance. Regular exercise will also improve your overall health. It can help you control your weight, reduce stress, and improve your bone density. Do not exercise so much that you hurt yourself, feel dizzy, or get very short of breath. Before you start a new exercise program, talk with your health care provider. This information is not intended to replace advice given to you by your health care provider. Make sure you discuss any questions you have with your health care provider. Document Revised: 10/01/2020 Document Reviewed: 10/01/2020 Elsevier Patient Education  2023 Elsevier Inc.  

## 2022-05-01 NOTE — Progress Notes (Signed)
[name],acting as a Education administrator for [name], MD.,have documented all relevant documentation on the behalf of [name], MD,as directed by  [name], MD while in the presence of [name], MD.    Subjective:     Patient ID: [name] , female    DOB: Mar 11, 1961 , 61 y.o.   MRN: [id number]   Chief Complaint  Patient presents with   Hormones   Climacteric    HPI  She is here today for f/u BHRT. She is currently using estradiol patches twice weekly. Patient has no other concerns.  BP Readings from Last 3 Encounters: 05/01/22 : 120/66 04/07/22 : 139/89 02/06/22 : 114/60    Insomnia Primary symptoms: sleep disturbance, no frequent awakening, no premature morning awakening, no malaise/fatigue.   The current episode started more than one year. The onset quality is gradual. The problem occurs nightly. The problem has been gradually worsening since onset. The symptoms are aggravated by work stress and family issues. How many beverages per day that contain caffeine: 2-3.  Types of beverages you drink: tea and coffee. The symptoms are relieved by quiet environment. Past treatments include medication. The treatment provided mild relief. Typical bedtime:  10-11 P.M..  How long after going to bed to you fall asleep: over an hour.   PMH includes: family stress or anxiety.  Prior diagnostic workup includes:  Polysomnogram and blood work.     Past Medical History:  Diagnosis Date   Current use of estrogen therapy 02/24/2015   Depressed 04/21/2015   Gastroesophageal reflux disease    Hair loss 02/24/2015   Hot flashes 02/24/2015   Hypertension      Family History  Problem Relation Age of Onset   Diabetes type II Mother    Coronary artery disease Mother    Diabetes type II Father    Coronary artery disease Father    Hypertension Father    Diabetes type II Sister    Kidney disease Sister    Diabetes Brother    Thyroid disease Sister    Hypertension Sister     Hypertension Sister    Hypertension Brother    Colon cancer Neg Hx    Colon polyps Neg Hx    Breast cancer Neg Hx    Ovarian cancer Neg Hx    Uterine cancer Neg Hx    Ulcerative colitis Neg Hx    Celiac disease Neg Hx    Crohn's disease Neg Hx      Current Outpatient Medications:    ALPRAZolam (XANAX) 0.5 MG tablet, TAKE (1/2) TO (1) TABLET TWICE DAILY AS NEEDED., Disp: 30 tablet, Rfl: 0   cetirizine (ZYRTEC) 10 MG tablet, Take 10 mg by mouth daily as needed for allergies., Disp: , Rfl:    escitalopram (LEXAPRO) 20 MG tablet, Take 1 tablet (20 mg total) by mouth daily., Disp: 90 tablet, Rfl: 0   estradiol (VIVELLE-DOT) 0.025 MG/24HR, Place 1 patch onto the skin 2 (two) times a week., Disp: 24 patch, Rfl: 0   fluticasone (FLONASE) 50 MCG/ACT nasal spray, Place 2 sprays into both nostrils daily. (Patient taking differently: Place 2 sprays into both nostrils daily as needed for allergies.), Disp: 16 g, Rfl: 0   hydrochlorothiazide (HYDRODIURIL) 25 MG tablet, Take 1 tablet (25 mg total) by mouth daily. Prn swelling, Disp: 90 tablet, Rfl: 0   ibuprofen (ADVIL) 200 MG tablet, Take 200 mg by mouth every 6 (six) hours as needed for mild pain.,  Disp: , Rfl:    NIFEdipine (PROCARDIA-XL/NIFEDICAL-XL) 30 MG 24 hr tablet, TAKE (1) TABLET BY MOUTH ONCE DAILY., Disp: 90 tablet, Rfl: 0   Omega-3 1000 MG CAPS, Take 1,000 mg by mouth daily., Disp: , Rfl:    OVER THE COUNTER MEDICATION, Take 15 mLs by mouth at bedtime. Rejuvicare collagen liquid, Disp: , Rfl:    Probiotic Product (ALIGN) 4 MG CAPS, Take 4 mg by mouth daily., Disp: , Rfl:    progesterone (PROMETRIUM) 100 MG capsule, Take 200 mg by mouth See admin instructions. Take 200 mg 6 days a week skipping Sunday dose, Disp: , Rfl:    VITAMIN D PO, Take 1 capsule by mouth daily., Disp: , Rfl:    Allergies  Allergen Reactions   Hydrocodone Itching   Lisinopril     Headache and dizziness   Metronidazole Hives   Norvasc [Amlodipine Besylate]      Unknown reaction    Plaquenil [Hydroxychloroquine Sulfate] Hives and Itching     Review of Systems  Constitutional: Negative.  Negative for malaise/fatigue.  HENT: Negative.    Eyes: Negative.   Respiratory: Negative.    Cardiovascular: Negative.   Gastrointestinal: Negative.   Psychiatric/Behavioral:  Positive for sleep disturbance. The patient has insomnia.      Today's Vitals   05/01/22 1436  BP: 120/66  Pulse: 85  Temp: 98.1 F (36.7 C)  TempSrc: Oral  Weight: 187 lb 3.2 oz (84.9 kg)  Height: '5\' 6"'$  (1.676 m)  PainSc: 0-No pain   Body mass index is 30.21 kg/m.  Wt Readings from Last 3 Encounters:  05/01/22 187 lb 3.2 oz (84.9 kg)  04/07/22 184 lb 6.4 oz (83.6 kg)  02/06/22 188 lb (85.3 kg)    Objective:  Physical Exam Vitals and nursing note reviewed.  Constitutional:      Appearance: Normal appearance.  HENT:     Head: Normocephalic and atraumatic.     Nose:     Comments: Masked     Mouth/Throat:     Comments: Masked  Eyes:     Extraocular Movements: Extraocular movements intact.  Cardiovascular:     Rate and Rhythm: Normal rate and regular rhythm.     Heart sounds: Normal heart sounds.  Pulmonary:     Effort: Pulmonary effort is normal.     Breath sounds: Normal breath sounds.  Musculoskeletal:     Cervical back: Normal range of motion.  Skin:    General: Skin is warm.  Neurological:     General: No focal deficit present.     Mental Status: She is alert.  Psychiatric:        Mood and Affect: Mood normal.        Behavior: Behavior normal.      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 She will c/w progesterone nightly. She will f/u in 4 months.  2. Essential hypertension, benign Comments: Chronic, well controlled. She is encouraged to follow a low sodium diet and to limit her intake of processed meats including bacon, sausages and deli meats. - Amb Referral To  Provider Referral Exercise Program (P.R.E.P)  3. Other abnormal glucose Comments: Her a1c has been elevated in the past, she has upcoming appt with her PCP. She is encouraged to have this checked during her physical. - Amb Referral To Provider Referral Exercise Program (P.R.E.P)  4. BMI 30.0-30.9,adult Comments: She is encouraged to aim for  at least 150 minutes of exercise per week, while striving to lose 7-10 pounds to decrease cardiac risk. - Amb Referral To Provider Referral Exercise Program (P.R.E.P)   Patient was given opportunity to ask questions. Patient verbalized understanding of the plan and was able to repeat key elements of the plan. All questions were answered to their satisfaction.   I, [name], MD, have reviewed all documentation for this visit. The documentation on 05/01/22 for the exam, diagnosis, procedures, and orders are all accurate and complete.   IF YOU HAVE BEEN REFERRED TO A SPECIALIST, IT MAY TAKE 1-2 WEEKS TO SCHEDULE/PROCESS THE REFERRAL. IF YOU HAVE NOT HEARD FROM US/SPECIALIST IN TWO WEEKS, PLEASE GIVE [place] A CALL AT [phone] X [phone].   THE PATIENT IS ENCOURAGED TO PRACTICE SOCIAL DISTANCING DUE TO THE COVID-19 PANDEMIC.

## 2022-05-03 ENCOUNTER — Telehealth: Payer: Self-pay | Admitting: *Deleted

## 2022-05-03 NOTE — Telephone Encounter (Signed)
Contacted regarding PREP Class referral. Declines at this time due to her work schedule and class time availability at the [employer]. She does not want to drive to the [facility] for an evening class.

## 2022-05-15 ENCOUNTER — Encounter: Payer: Self-pay | Admitting: Nurse Practitioner

## 2022-05-15 DIAGNOSIS — M5432 Sciatica, left side: Secondary | ICD-10-CM

## 2022-05-17 ENCOUNTER — Other Ambulatory Visit (HOSPITAL_COMMUNITY): Payer: Self-pay | Admitting: Family Medicine

## 2022-05-17 DIAGNOSIS — Z1231 Encounter for screening mammogram for malignant neoplasm of breast: Secondary | ICD-10-CM

## 2022-05-22 ENCOUNTER — Ambulatory Visit (HOSPITAL_COMMUNITY)
Admission: RE | Admit: 2022-05-22 | Discharge: 2022-05-22 | Disposition: A | Discharge status: Home / Self Care | Payer: PRIVATE HEALTH INSURANCE | Source: Ambulatory Visit | Attending: Family Medicine | Admitting: Family Medicine

## 2022-05-22 DIAGNOSIS — Z1231 Encounter for screening mammogram for malignant neoplasm of breast: Secondary | ICD-10-CM | POA: Insufficient documentation

## 2022-06-06 ENCOUNTER — Other Ambulatory Visit: Payer: Self-pay | Admitting: Internal Medicine

## 2022-06-27 ENCOUNTER — Other Ambulatory Visit (HOSPITAL_COMMUNITY): Payer: Self-pay | Admitting: Neurosurgery

## 2022-06-27 DIAGNOSIS — M5416 Radiculopathy, lumbar region: Secondary | ICD-10-CM

## 2022-06-28 ENCOUNTER — Encounter (HOSPITAL_COMMUNITY): Payer: PRIVATE HEALTH INSURANCE | Admitting: Physical Therapy

## 2022-06-28 ENCOUNTER — Other Ambulatory Visit: Payer: Self-pay

## 2022-06-28 ENCOUNTER — Ambulatory Visit (HOSPITAL_COMMUNITY): Payer: PRIVATE HEALTH INSURANCE | Attending: Neurosurgery | Admitting: Physical Therapy

## 2022-06-28 DIAGNOSIS — M5416 Radiculopathy, lumbar region: Secondary | ICD-10-CM | POA: Insufficient documentation

## 2022-06-28 DIAGNOSIS — M6281 Muscle weakness (generalized): Secondary | ICD-10-CM | POA: Diagnosis present

## 2022-06-28 NOTE — Therapy (Signed)
OUTPATIENT PHYSICAL THERAPY THORACOLUMBAR EVALUATION   Patient Name: [name] MRN: [id number] DOB:[birth year], 62 y.o., female Today's Date: 06/28/2022  END OF SESSION:  PT End of Session - 06/28/22 1515    Visit Number 1    Number of Visits 6    Date for PT Re-Evaluation 07/19/22    Authorization Type [employer]    Progress Note Due on Visit 6    PT Start Time 1440    PT Stop Time 1515    PT Time Calculation (min) 35 min             Past Medical History:  Diagnosis Date   Current use of estrogen therapy 02/24/2015   Depressed 04/21/2015   Gastroesophageal reflux disease    Hair loss 02/24/2015   Hot flashes 02/24/2015   Hypertension    Past Surgical History:  Procedure Laterality Date   CHOLECYSTECTOMY     COLONOSCOPY  07/04/2011   sessile polyp in the mid transverse colon/moderate diverticulosis in the sigmoid to descending colon segments/internal hemorrhoids   COLONOSCOPY WITH PROPOFOL N/A 02/06/2022   Procedure: COLONOSCOPY WITH PROPOFOL;  Surgeon: Eloise Harman, DO;  Location: [facility];  Service: Endoscopy;  Laterality: N/A;  10:45am, asa 2   ESOPHAGOGASTRODUODENOSCOPY     POLYPECTOMY  02/06/2022   Procedure: POLYPECTOMY;  Surgeon: Eloise Harman, DO;  Location: [facility];  Service: Endoscopy;;   retrograde ureteral stone extraction     Total abdominal hysterectomy with probable salpingo-oophorectomy  2006-8   HEAVY MENSES/? 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   Lichen planopilaris 32/91/9166   Anxiety and depression 09/06/2015   Chronic fatigue 09/06/2015   Elevated ferritin 08/08/2015   Prediabetes 04/30/2015   Depressed 04/21/2015   Current use of estrogen therapy 02/24/2015   Hair loss 02/24/2015   Hot flashes 02/24/2015   Peripheral edema 11/19/2014   Essential hypertension, benign 10/25/2012   Epigastric abdominal pain 11/01/2011   Abdominal pain 06/29/2011    PCP: Sallee Lange   REFERRING PROVIDER: Chase Caller, PA-C  REFERRING DIAG: M60.04 (ICD-10-CM) - Radiculopathy, lumbar region  Rationale for Evaluation and Treatment: Rehabilitation  THERAPY DIAG:  See above  ONSET DATE: 04/20/2023  SUBJECTIVE:  SUBJECTIVE STATEMENT: Ms. Zuver states that she has been having pain in her back going down her leg for about two months.  She states that the pain goes down to her foot and is constant.  She can only walk for short distances, ( approximately 10 minutes),  or she has to get a buggy to lean on, she can stand for five minutes and has increased pain after 15 minutes.    PERTINENT HISTORY:  See above   PAIN:  Are you having pain? Yes: NPRS scale: 6/10, worst has been a 7; best 5 Pain location: B low back down RT leg Pain description: throb and ache Aggravating factors: sitting and standing worse than walking  Relieving factors: tylenol, heat,   PRECAUTIONS: None  WEIGHT BEARING RESTRICTIONS: No  FALLS:  Has patient fallen in last 6 months? No  LIVING ENVIRONMENT: Lives with: lives with their family Lives in: House/apartment Stairs: Yes: External: 2 steps; on right going up Has following equipment at home: None  OCCUPATION: sit, walk   PLOF: Independent  PATIENT GOALS: to have less  pain, sit and walk better.   NEXT MD VISIT: 07/26/22  OBJECTIVE:   DIAGNOSTIC FINDINGS:  Awaiting an MRI   PATIENT SURVEYS:  FOTO 48    COGNITION: Overall cognitive status: Within functional limits for tasks assessed    POSTURE: No Significant postural limitations    LUMBAR ROM:   AROM eval  Flexion Fingers to mid shin area reps increase pain while pulling up   Extension 20: reps same   Right lateral flexion   Left lateral flexion   Right rotation   Left rotation    (Blank rows = not tested)  LOWER EXTREMITY MMT:    MMT Right eval Left eval  Hip flexion 3- 4-  Hip extension 3- 3  Hip abduction 3 3  Hip adduction    Hip internal rotation    Hip external rotation    Knee flexion 3 3  Knee extension 3+  3+  Ankle dorsiflexion 4- 4-  Ankle plantarflexion    Ankle inversion    Ankle eversion     (Blank rows = not tested)    FUNCTIONAL TESTS:  30 seconds chair stand test: 10 below poor for age and sex  2 minute walk test: 428 ft increased fatigue and pain at the end of two minutes  TODAY'S TREATMENT:                                                                                                                              DATE: 06/28/2022 Evaluation Standing: Standing extension x 10 Supine Ab set x 10  Knee to chest 3 x 30" Active hamstring stretch x 3    PATIENT EDUCATION:  Education details: HEP, why extension can benefit bulged discs Person educated: Patient Education method: Explanation and Handouts Education comprehension: verbalized understanding  HOME EXERCISE PROGRAM: Access Code: [email or web address] URL: [email or web address] Date: 06/28/2022 Prepared by:  Rayetta Humphrey  Exercises - Standing Lumbar Extension  - 2 x daily - 7 x weekly - 1 sets - 10 reps - 20" hold - Supine Single Knee to Chest Stretch  - 2 x daily - 7 x weekly - 1 sets - 3 reps - 20" hold - Hooklying Active Hamstring Stretch  - 2 x daily - 7 x weekly - 1 sets - 3  reps - 20" hold - Abdominal Bracing  - 3 x daily - 7 x weekly - 1 sets - 10 reps - 5" hold ASSESSMENT:  CLINICAL IMPRESSION: Patient is a 62 y.o. female who was seen today for physical therapy evaluation and treatment for lumbar radiculopathy. Evaluation demonstrates decreased ROM, decreased activity tolerance, decreased strength and increased pain.  Ms. Milholland will benefit from skilled PT to address these issues and maximize her functioning ability.  OBJECTIVE IMPAIRMENTS: decreased activity tolerance, decreased balance, difficulty walking, decreased ROM, decreased strength, improper body mechanics, and pain.   ACTIVITY LIMITATIONS: carrying, lifting, standing, squatting, and locomotion level  PARTICIPATION LIMITATIONS: cleaning, laundry, shopping, community activity, and occupation  PERSONAL FACTORS: Past/current experiences and Time since onset of injury/illness/exacerbation are also affecting patient's functional outcome.   REHAB POTENTIAL: Good  CLINICAL DECISION MAKING: Stable/uncomplicated  EVALUATION COMPLEXITY: Moderate   GOALS: Goals reviewed with patient? No  SHORT TERM GOALS: Target date: 07/08/22  PT to be I in HEP in order to decrease her radicular sx Baseline: Goal status: INITIAL  2.  Pt pain to be no greater than a 7/10 throughout the day Baseline:  Goal status: INITIAL  3.  PT to demonstrate good body mechanics for bed mobility to decrease nerve irritation  Baseline:  Goal status: INITIAL    LONG TERM GOALS: Target date: 07/19/22  PT to be I in an advanced HEP in order to decrease stress off her low back  Baseline:  Goal status: INITIAL  2.  PT radicular sx to go no further than her buttock  Baseline:  Goal status: INITIAL  3.  PT to be able to sit in comfort for 30 minutes Baseline:  Goal status: INITIAL  4.  PT to be able to stand/walk for 30 minutes without increased pain for shopping activities  Baseline:  Goal status:  INITIAL   PLAN:  PT FREQUENCY: 2x/week  PT DURATION: 3 weeks  PLANNED INTERVENTIONS: Therapeutic exercises, Therapeutic activity, Neuromuscular re-education, Balance training, Gait training, Patient/Family education, Self Care, and Joint mobilization.  PLAN FOR NEXT SESSION: demonstrate a lumbar roll, educate in bed mobility mechanics and why it is important.  Begin stabilization exercises as well as piriformis stretch.  Rayetta Humphrey, PT CLT [phone]  06/28/2022, 2:46 PM

## 2022-07-02 ENCOUNTER — Encounter: Payer: Self-pay | Admitting: Nurse Practitioner

## 2022-07-03 ENCOUNTER — Encounter (HOSPITAL_COMMUNITY): Payer: PRIVATE HEALTH INSURANCE | Admitting: Physical Therapy

## 2022-07-03 NOTE — Telephone Encounter (Signed)
Nurses I am sympathetic to her issue.  ER unlikely to give a shot for pain. If she would like to be seen this week please work her into a open slot  We can discuss trying to help her for her pain at that visit  thank you

## 2022-07-04 ENCOUNTER — Ambulatory Visit: Payer: PRIVATE HEALTH INSURANCE | Admitting: Family Medicine

## 2022-07-04 VITALS — BP 140/82 | HR 87 | Temp 97.8°F | Ht 66.0 in | Wt 189.2 lb

## 2022-07-04 DIAGNOSIS — M5441 Lumbago with sciatica, right side: Secondary | ICD-10-CM | POA: Diagnosis not present

## 2022-07-04 MED ORDER — PREGABALIN 25 MG PO CAPS: 25.0000 mg | ORAL_CAPSULE | Freq: Two times a day (BID) | ORAL | 1 refills | Status: DC

## 2022-07-04 MED ORDER — PREDNISONE 20 MG PO TABS: ORAL_TABLET | ORAL | 0 refills | Status: DC

## 2022-07-04 NOTE — Patient Instructions (Addendum)
Hi Casia  What you have going on with his impingement of the nerves in your lower back that runs down your leg.  The term used for this is sciatica.  I will work with radiology department to see if they might be able to do your MRI sooner-we will let you know  Low-dose Lyrica may help take the edge off of this discomfort 1 pill taken every 12 hours The first 3 days I would recommend just taking 1 in the evening After the first 3 days if you tolerate it well you may use 1 twice daily Plus small number of people can feel drowsy or dizzy with this medicine if it makes you have too many side effects you can easily stop the medicine. After you have been on the medicine for 5 to 6 days if you are tolerating it well and if it is not helping enough you can let me know and we can adjust the dose upward gradually.  We will also use low-dose prednisone 2 pills a day for 5 days take with a snack  Currently we will hold off on opioid pain medicine but if the pain becomes unbearable please let me know and if I need to send you in a limited supply we can do so.  Communication is the key-please keep [place] updated on how things are going send me an update by early next week thank you-Dr. Sallee Lange

## 2022-07-04 NOTE — Progress Notes (Signed)
   Subjective:    Patient ID: [name], female    DOB: [birth year], 62 y.o.   MRN: [id number]  HPI  Patient arrives today with lower back pain traveling to right leg. She has had this problem going on since November approximately 8 weeks for the pain down her right leg.  Burning pain aching some intermittent weakness has seen nurse practitioner here as well as specialists in [place] they have her scheduled for MRI toward the end of the month  She is having severe pain Naprosyn and ibuprofen not helping.  She states gabapentin in the past caused dizziness which she was started on 300 mg 3 times daily She wonders if she needs to see orthopedics     Review of Systems     Objective:   Physical Exam Sciatica is noted in the right leg No exceptional weakness going on currently Reflexes are good Has positive straight leg raise Severe pain down the side of her leg all the way into her foot       Assessment & Plan:  Sciatica Short prednisone dosing Previously she had tried gabapentin may cause dizziness but she was started on 300 mg 3 times a day which was too much we did discuss how low-dose typically tolerates better we will go ahead with a low-dose of the Lyrica 25 mg in the evening for the first 3 days then after that twice daily Hold off on opioids We will touch base with radiology to see if they might be able to do the MRI sooner.  Currently I do not think that orthopedics would have much to add.  I would recommend following through the MRI and seeing the specialist Hopefully low-dose Lyrica will help Patient is to keep [place] up-to-date

## 2022-07-05 ENCOUNTER — Other Ambulatory Visit: Payer: Self-pay | Admitting: Nurse Practitioner

## 2022-07-11 ENCOUNTER — Encounter: Payer: Self-pay | Admitting: Family Medicine

## 2022-07-11 ENCOUNTER — Ambulatory Visit: Payer: PRIVATE HEALTH INSURANCE | Admitting: Family Medicine

## 2022-07-11 DIAGNOSIS — B9789 Other viral agents as the cause of diseases classified elsewhere: Secondary | ICD-10-CM

## 2022-07-11 DIAGNOSIS — J988 Other specified respiratory disorders: Secondary | ICD-10-CM | POA: Diagnosis not present

## 2022-07-11 MED ORDER — PROMETHAZINE-DM 6.25-15 MG/5ML PO SYRP: 5.0000 mL | ORAL_SOLUTION | Freq: Four times a day (QID) | ORAL | 0 refills | Status: DC | PRN

## 2022-07-11 NOTE — Assessment & Plan Note (Signed)
Suspect viral cause.  Awaiting COVID, flu, RSV testing.  Promethazine DM for cough.  Work note given.  Supportive care.

## 2022-07-11 NOTE — Progress Notes (Signed)
Subjective:  Patient ID: [name], female    DOB: 1960/07/10  Age: 62 y.o. MRN: [id number]  CC: Chief Complaint  Patient presents with   URI    Pt presents with URI per pt; head congestion, headache, chills and cough. Symptoms going on for 3 days. No breathing issues. No current testing.     HPI:  62 year old female presents for evaluation of the above.  Patient states that she has been sick since Saturday.  She has had cough, chills, congestion and headache.  No fever.  No shortness of breath.  No reported sick contacts.  No relieving factors.  She is most bothered by the cough and the fatigue.  She has had no testing at home.  No other complaints or concerns at this time.  Patient Active Problem List   Diagnosis Date Noted   Viral respiratory infection 07/11/2022   Acute right-sided low back pain with right-sided sciatica 04/08/2022   Vitamin D deficiency 07/03/2020   Chronic insomnia 11/10/2019   Menopausal and female climacteric states 10/09/2017   Frontal fibrosing alopecia 86/75/4492   Lichen planopilaris 01/00/7121   Anxiety and depression 09/06/2015   Chronic fatigue 09/06/2015   Elevated ferritin 08/08/2015   Prediabetes 04/30/2015   Current use of estrogen therapy 02/24/2015   Hot flashes 02/24/2015   Peripheral edema 11/19/2014   Essential hypertension, benign 10/25/2012    Social Hx   Social History   Socioeconomic History   Marital status: Married    Spouse name: Not on file   Number of children: Not on file   Years of education: Not on file   Highest education level: Not on file  Occupational History   Not on file  Tobacco Use   Smoking status: Never   Smokeless tobacco: Never  Vaping Use   Vaping Use: Never used  Substance and Sexual Activity   Alcohol use: No   Drug use: No   Sexual activity: Yes    Birth control/protection: Surgical    Comment: hyst  Other Topics Concern   Not on file  Social History Narrative   Not on file    Social Determinants of Health   Financial Resource Strain: Not on file  Food Insecurity: Not on file  Transportation Needs: Not on file  Physical Activity: Not on file  Stress: Not on file  Social Connections: Not on file    Review of Systems Per HPI  Objective:  BP 130/73   Pulse 62   Temp (!) 97.2 F (36.2 C)   Wt 192 lb (87.1 kg)   SpO2 98%   BMI 30.99 kg/m      07/11/2022    3:39 PM 07/11/2022    3:34 PM 07/04/2022    1:10 PM  BP/Weight  Systolic BP 975 883 254  Diastolic BP 73 74 82  Wt. (Lbs)  192 189.2  BMI  30.99 kg/m2 30.54 kg/m2    Physical Exam Vitals and nursing note reviewed.  Constitutional:      General: She is not in acute distress.    Appearance: She is not ill-appearing.  HENT:     Head: Normocephalic and atraumatic.     Right Ear: Tympanic membrane normal.     Left Ear: Tympanic membrane normal.     Mouth/Throat:     Pharynx: Oropharynx is clear.  Eyes:     General:        Right eye: No discharge.        Left  eye: No discharge.     Conjunctiva/sclera: Conjunctivae normal.  Cardiovascular:     Rate and Rhythm: Normal rate and regular rhythm.  Pulmonary:     Effort: Pulmonary effort is normal.     Breath sounds: Normal breath sounds. No wheezing, rhonchi or rales.  Neurological:     Mental Status: She is alert.     Lab Results  Component Value Date   WBC 7.9 12/19/2021   HGB 13.1 12/19/2021   HCT 40.3 12/19/2021   PLT 257 12/19/2021   GLUCOSE 95 12/19/2021   CHOL 211 (H) 03/14/2021   TRIG 97 03/14/2021   HDL 65 03/14/2021   LDLCALC 129 (H) 03/14/2021   ALT 12 12/19/2021   AST 12 12/19/2021   NA 141 12/19/2021   K 4.1 12/19/2021   CL 107 (H) 12/19/2021   CREATININE 0.94 12/19/2021   BUN 16 12/19/2021   CO2 22 12/19/2021   TSH 1.810 03/14/2021   HGBA1C 5.9 (H) 12/19/2021     Assessment & Plan:   Problem List Items Addressed This Visit       Respiratory   Viral respiratory infection    Suspect viral cause.   Awaiting COVID, flu, RSV testing.  Promethazine DM for cough.  Work note given.  Supportive care.      Relevant Medications   promethazine-dextromethorphan (PROMETHAZINE-DM) 6.25-15 MG/5ML syrup   Other Relevant Orders   COVID-19, Flu A+B and RSV    Meds ordered this encounter  Medications   promethazine-dextromethorphan (PROMETHAZINE-DM) 6.25-15 MG/5ML syrup    Sig: Take 5 mLs by mouth 4 (four) times daily as needed for cough.    Dispense:  118 mL    Refill:  [place]

## 2022-07-11 NOTE — Patient Instructions (Signed)
Rest. Fluids.  Cough medication as prescribed.  Awaiting test results.  Take care  Dr. Lacinda Axon

## 2022-07-13 ENCOUNTER — Other Ambulatory Visit: Payer: Self-pay | Admitting: Family Medicine

## 2022-07-13 ENCOUNTER — Encounter: Payer: Self-pay | Admitting: Family Medicine

## 2022-07-13 LAB — COVID-19, FLU A+B AND RSV
Influenza A, NAA: NOT DETECTED
Influenza B, NAA: NOT DETECTED
RSV, NAA: NOT DETECTED
SARS-CoV-2, NAA: DETECTED — AB

## 2022-07-13 MED ORDER — NIRMATRELVIR/RITONAVIR (PAXLOVID)TABLET: ORAL_TABLET | ORAL | 0 refills | Status: DC

## 2022-07-17 ENCOUNTER — Encounter: Payer: Self-pay | Admitting: Family Medicine

## 2022-07-18 ENCOUNTER — Other Ambulatory Visit: Payer: Self-pay | Admitting: Nurse Practitioner

## 2022-07-18 ENCOUNTER — Ambulatory Visit (HOSPITAL_COMMUNITY)
Admission: RE | Admit: 2022-07-18 | Discharge: 2022-07-18 | Disposition: A | Discharge status: Home / Self Care | Payer: PRIVATE HEALTH INSURANCE | Source: Ambulatory Visit | Attending: Neurosurgery | Admitting: Neurosurgery

## 2022-07-18 ENCOUNTER — Other Ambulatory Visit: Payer: Self-pay | Admitting: Family Medicine

## 2022-07-18 DIAGNOSIS — M5416 Radiculopathy, lumbar region: Secondary | ICD-10-CM | POA: Insufficient documentation

## 2022-07-18 MED ORDER — ALBUTEROL SULFATE HFA 108 (90 BASE) MCG/ACT IN AERS: 1.0000 | INHALATION_SPRAY | Freq: Four times a day (QID) | RESPIRATORY_TRACT | 0 refills | Status: DC | PRN

## 2022-08-28 ENCOUNTER — Encounter: Payer: Self-pay | Admitting: Internal Medicine

## 2022-08-28 ENCOUNTER — Ambulatory Visit: Payer: PRIVATE HEALTH INSURANCE | Admitting: Internal Medicine

## 2022-08-28 VITALS — BP 130/60 | HR 87 | Temp 98.5°F | Ht 66.0 in | Wt 184.2 lb

## 2022-08-28 DIAGNOSIS — I10 Essential (primary) hypertension: Secondary | ICD-10-CM | POA: Diagnosis not present

## 2022-08-28 DIAGNOSIS — N951 Menopausal and female climacteric states: Secondary | ICD-10-CM

## 2022-08-28 DIAGNOSIS — Z2821 Immunization not carried out because of patient refusal: Secondary | ICD-10-CM

## 2022-08-28 DIAGNOSIS — Z6829 Body mass index (BMI) 29.0-29.9, adult: Secondary | ICD-10-CM

## 2022-08-28 DIAGNOSIS — R7309 Other abnormal glucose: Secondary | ICD-10-CM

## 2022-08-28 MED ORDER — ESTRADIOL 0.025 MG/24HR TD PTTW: 1.0000 | MEDICATED_PATCH | TRANSDERMAL | 1 refills | Status: DC

## 2022-08-28 NOTE — Patient Instructions (Addendum)
The 10-year ASCVD risk score (Arnett DK, et al., 2019) is: 7.3%   Values used to calculate the score:     Age: 62 years     Sex: Female     Is Non-Hispanic African American: Yes     Diabetic: No     Tobacco smoker: No     Systolic Blood Pressure: [id number] mmHg     Is BP treated: Yes     HDL Cholesterol: 65 mg/dL     Total Cholesterol: 211 mg/dL  Coronary Calcium Scan A coronary calcium scan is an imaging test used to look for deposits of plaque in the inner lining of the blood vessels of the heart (coronary arteries). Plaque is made up of calcium, protein, and fatty substances. These deposits of plaque can partly clog and narrow the coronary arteries without producing any symptoms or warning signs. This puts a person at risk for a heart attack. A coronary calcium scan is performed using a computed tomography (CT) scanner machine without using a dye (contrast). This test is recommended for people who are at moderate risk for heart disease. The test can find plaque deposits before symptoms develop. Tell a health care provider about: Any allergies you have. All medicines you are taking, including vitamins, herbs, eye drops, creams, and over-the-counter medicines. Any problems you or family members have had with anesthetic medicines. Any bleeding problems you have. Any surgeries you have had. Any medical conditions you have. Whether you are pregnant or may be pregnant. What are the risks? Generally, this is a safe procedure. However, problems may occur, including: Harm to a pregnant woman and her unborn baby. This test involves the use of radiation. Radiation exposure can be dangerous to a pregnant woman and her unborn baby. If you are pregnant or think you may be pregnant, you should not have this procedure done. A slight increase in the risk of cancer. This is because of the radiation involved in the test. The amount of radiation from one test is similar to the amount of radiation you are  naturally exposed to over one year. What happens before the procedure? Ask your health care provider for any specific instructions on how to prepare for this procedure. You may be asked to avoid products that contain caffeine, tobacco, or nicotine for 4 hours before the procedure. What happens during the procedure?  You will undress and remove any jewelry from your neck or chest. You may need to remove hearing aides and dentures. Women may need to remove their bras. You will put on a hospital gown. Sticky electrodes will be placed on your chest. The electrodes will be connected to an electrocardiogram (ECG) machine to record a tracing of the electrical activity of your heart. You will lie down on your back on a curved bed that is attached to the [place]. You may be given medicine to slow down your heart rate so that clear pictures can be created. You will be moved into the CT scanner, and the CT scanner will take pictures of your heart. During this time, you will be asked to lie still and hold your breath for 10-20 seconds at a time while each picture of your heart is being taken. The procedure may vary among health care providers and hospitals. What can I expect after the procedure? You can return to your normal activities. It is up to you to get the results of your procedure. Ask your health care provider, or the department that is doing  the procedure, when your results will be ready. Summary A coronary calcium scan is an imaging test used to look for deposits of plaque in the inner lining of the blood vessels of the heart. Plaque is made up of calcium, protein, and fatty substances. A coronary calcium scan is performed using a CT scanner machine without contrast. Generally, this is a safe procedure. Tell your health care provider if you are pregnant or may be pregnant. Ask your health care provider for any specific instructions on how to prepare for this procedure. You can return to your  normal activities after the scan is done. This information is not intended to replace advice given to you by your health care provider. Make sure you discuss any questions you have with your health care provider. Document Revised: 05/15/2021 Document Reviewed: 05/15/2021 Elsevier Patient Education  [place].   Exercising to Stay Healthy To become healthy and stay healthy, it is recommended that you do moderate-intensity and vigorous-intensity exercise. You can tell that you are exercising at a moderate intensity if your heart starts beating faster and you start breathing faster but can still hold a conversation. You can tell that you are exercising at a vigorous intensity if you are breathing much harder and faster and cannot hold a conversation while exercising. How can exercise benefit me? Exercising regularly is important. It has many health benefits, such as: Improving overall fitness, flexibility, and endurance. Increasing bone density. Helping with weight control. Decreasing body fat. Increasing muscle strength and endurance. Reducing stress and tension, anxiety, depression, or anger. Improving overall health. What guidelines should I follow while exercising? Before you start a new exercise program, talk with your health care provider. Do not exercise so much that you hurt yourself, feel dizzy, or get very short of breath. Wear comfortable clothes and wear shoes with good support. Drink plenty of water while you exercise to prevent dehydration or heat stroke. Work out until your breathing and your heartbeat get faster (moderate intensity). How often should I exercise? Choose an activity that you enjoy, and set realistic goals. Your health care provider can help you make an activity plan that is individually designed and works best for you. Exercise regularly as told by your health care provider. This may include: Doing strength training two times a week, such as: Lifting  weights. Using resistance bands. Push-ups. Sit-ups. Yoga. Doing a certain intensity of exercise for a given amount of time. Choose from these options: A total of 150 minutes of moderate-intensity exercise every week. A total of 75 minutes of vigorous-intensity exercise every week. A mix of moderate-intensity and vigorous-intensity exercise every week. Children, pregnant women, people who have not exercised regularly, people who are overweight, and older adults may need to talk with a health care provider about what activities are safe to perform. If you have a medical condition, be sure to talk with your health care provider before you start a new exercise program. What are some exercise ideas? Moderate-intensity exercise ideas include: Walking 1 mile (1.6 km) in about 15 minutes. Biking. Hiking. Golfing. Dancing. Water aerobics. Vigorous-intensity exercise ideas include: Walking 4.5 miles (7.2 km) or more in about 1 hour. Jogging or running 5 miles (8 km) in about 1 hour. Biking 10 miles (16.1 km) or more in about 1 hour. Lap swimming. Roller-skating or in-line skating. Cross-country skiing. Vigorous competitive sports, such as football, basketball, and soccer. Jumping rope. Aerobic dancing. What are some everyday activities that can help me get exercise?  Yard work, such as: Psychologist, educational. Raking and bagging leaves. Washing your car. Pushing a stroller. Shoveling snow. Gardening. Washing windows or floors. How can I be more active in my day-to-day activities? Use stairs instead of an elevator. Take a walk during your lunch break. If you drive, park your car farther away from your work or school. If you take public transportation, get off one stop early and walk the rest of the way. Stand up or walk around during all of your indoor phone calls. Get up, stretch, and walk around every 30 minutes throughout the day. Enjoy exercise with a friend. Support to continue  exercising will help you keep a regular routine of activity. Where to find more information You can find more information about exercising to stay healthy from: U.S. Department of Health and Human Services: [employer].at Centers for Disease Control and Prevention (CDC): http://www.wolf.info/ Summary Exercising regularly is important. It will improve your overall fitness, flexibility, and endurance. Regular exercise will also improve your overall health. It can help you control your weight, reduce stress, and improve your bone density. Do not exercise so much that you hurt yourself, feel dizzy, or get very short of breath. Before you start a new exercise program, talk with your health care provider. This information is not intended to replace advice given to you by your health care provider. Make sure you discuss any questions you have with your health care provider. Document Revised: 10/01/2020 Document Reviewed: 10/01/2020 Elsevier Patient Education  [place].

## 2022-08-28 NOTE — Progress Notes (Signed)
[name],acting as a Education administrator for Anita Greenland, MD.,have documented all relevant documentation on the behalf of Anita Greenland, MD,as directed by  Anita Greenland, MD while in the presence of Anita Greenland, MD.    Subjective:     Patient ID: [name] , female    DOB: 1961/01/06 , 62 y.o.   MRN: [id number]   Chief Complaint  Patient presents with   Hormones    Climacteric    HPI  She is here today for f/u BHRT. She is currently using estradiol patches twice weekly. She is also taking progesterone nightly. She is hesitant to completely wean off of the estrogen patches.  Patient has no other concerns.  BP Readings from Last 3 Encounters: 08/28/22 : 130/60 07/11/22 : 130/73 07/04/22 : (!) 140/82       Past Medical History:  Diagnosis Date   Current use of estrogen therapy 02/24/2015   Depressed 04/21/2015   Gastroesophageal reflux disease    Hair loss 02/24/2015   Hot flashes 02/24/2015   Hypertension      Family History  Problem Relation Age of Onset   Diabetes type II Mother    Coronary artery disease Mother    Diabetes type II Father    Coronary artery disease Father    Hypertension Father    Diabetes type II Sister    Kidney disease Sister    Diabetes Brother    Thyroid disease Sister    Hypertension Sister    Hypertension Sister    Hypertension Brother    Colon cancer Neg Hx    Colon polyps Neg Hx    Breast cancer Neg Hx    Ovarian cancer Neg Hx    Uterine cancer Neg Hx    Ulcerative colitis Neg Hx    Celiac disease Neg Hx    Crohn's disease Neg Hx      Current Outpatient Medications:    albuterol (VENTOLIN HFA) 108 (90 Base) MCG/ACT inhaler, Inhale 1-2 puffs into the lungs every 6 (six) hours as needed for wheezing or shortness of breath., Disp: 8 g, Rfl: 0   ALPRAZolam (XANAX) 0.5 MG tablet, TAKE (1/2) TO (1) TABLET TWICE DAILY AS NEEDED., Disp: 30 tablet, Rfl: 0   cetirizine (ZYRTEC) 10 MG tablet, Take 10 mg by mouth daily as needed for  allergies., Disp: , Rfl:    escitalopram (LEXAPRO) 20 MG tablet, TAKE 1 TABLET BY MOUTH ONCE DAILY., Disp: 90 tablet, Rfl: 0   estradiol (VIVELLE-DOT) 0.025 MG/24HR, Place 1 patch onto the skin 2 (two) times a week., Disp: 24 patch, Rfl: 1   fluticasone (FLONASE) 50 MCG/ACT nasal spray, Place 2 sprays into both nostrils daily. (Patient taking differently: Place 2 sprays into both nostrils daily as needed for allergies.), Disp: 16 g, Rfl: 0   hydrochlorothiazide (HYDRODIURIL) 25 MG tablet, Take 1 tablet (25 mg total) by mouth daily. Prn swelling, Disp: 90 tablet, Rfl: 0   ibuprofen (ADVIL) 200 MG tablet, Take 200 mg by mouth every 6 (six) hours as needed for mild pain., Disp: , Rfl:    NIFEdipine (PROCARDIA-XL/NIFEDICAL-XL) 30 MG 24 hr tablet, TAKE (1) TABLET BY MOUTH ONCE DAILY., Disp: 90 tablet, Rfl: 0   Omega-3 1000 MG CAPS, Take 1,000 mg by mouth daily., Disp: , Rfl:    OVER THE COUNTER MEDICATION, Take 15 mLs by mouth at bedtime. Rejuvicare collagen liquid, Disp: , Rfl:    pregabalin (LYRICA) 25 MG capsule, Take 1 capsule (25  mg total) by mouth 2 (two) times daily., Disp: 60 capsule, Rfl: 1   Probiotic Product (ALIGN) 4 MG CAPS, Take 4 mg by mouth daily., Disp: , Rfl:    progesterone (PROMETRIUM) 100 MG capsule, Take 200 mg by mouth See admin instructions. Take 200 mg 6 days a week skipping Sunday dose, Disp: , Rfl:    promethazine-dextromethorphan (PROMETHAZINE-DM) 6.25-15 MG/5ML syrup, Take 5 mLs by mouth 4 (four) times daily as needed for cough., Disp: 118 mL, Rfl: 0   VITAMIN D PO, Take 1 capsule by mouth daily., Disp: , Rfl:    Allergies  Allergen Reactions   Hydrocodone Itching   Lisinopril     Headache and dizziness   Metronidazole Hives   Norvasc [Amlodipine Besylate]     Unknown reaction    Plaquenil [Hydroxychloroquine Sulfate] Hives and Itching     Review of Systems  Constitutional: Negative.   Respiratory: Negative.    Cardiovascular: Negative.   Gastrointestinal:  Negative.   Musculoskeletal: Negative.   Skin: Negative.   Neurological: Negative.   Psychiatric/Behavioral:  Positive for sleep disturbance. The patient has insomnia.      Today's Vitals   08/28/22 1137  BP: 130/60  Pulse: 87  Temp: 98.5 F (36.9 C)  TempSrc: Oral  Weight: 184 lb 3.2 oz (83.6 kg)  Height: 5\' 6"  (1.676 m)  PainSc: 0-No pain   Body mass index is 29.73 kg/m.  Wt Readings from Last 3 Encounters:  08/28/22 184 lb 3.2 oz (83.6 kg)  07/11/22 192 lb (87.1 kg)  07/04/22 189 lb 3.2 oz (85.8 kg)     BP Readings from Last 3 Encounters:  08/28/22 130/60  07/11/22 130/73  07/04/22 (!) 140/82    Objective:  Physical Exam Vitals and nursing note reviewed.  Constitutional:      Appearance: Normal appearance.  HENT:     Head: Normocephalic and atraumatic.     Nose:     Comments: Masked     Mouth/Throat:     Comments: Masked  Eyes:     Extraocular Movements: Extraocular movements intact.  Cardiovascular:     Rate and Rhythm: Normal rate and regular rhythm.     Heart sounds: Normal heart sounds.  Pulmonary:     Effort: Pulmonary effort is normal.     Breath sounds: Normal breath sounds.  Musculoskeletal:     Cervical back: Normal range of motion.  Skin:    General: Skin is warm.  Neurological:     General: No focal deficit present.     Mental Status: She is alert.  Psychiatric:        Mood and Affect: Mood normal.        Behavior: Behavior normal.      Assessment And Plan:     1. Female climacteric state Comments: I will decrease estradiol patch to 0.025mg  twice weekly. I plan to reduce progesterone to 125mg  nightly. She will f/u in 3 months for re-evaluation. - CMP14+EGFR  2. Essential hypertension, benign Comments: Chronic, controlled. Currently managed by her PCP w/ hctz and nifedipine. She is encouraged to follow low sodium diet. - CMP14+EGFR - Lipid panel  3. Other abnormal glucose Comments: Pevious labs reviewed, a1c has been elevated in  the past. I will recheck this today. She is encouraged to decrease her sugar intake. - Hemoglobin A1c  4. Body mass index (BMI) of 29.0 to 29.9 in adult Comments: She is encouraged to aim for at least 150 minutes of exercise/week.  5.  Herpes zoster vaccination declined     Patient was given opportunity to ask questions. Patient verbalized understanding of the plan and was able to repeat key elements of the plan. All questions were answered to their satisfaction.   I, Anita Greenland, MD, have reviewed all documentation for this visit. The documentation on 08/28/22 for the exam, diagnosis, procedures, and orders are all accurate and complete.   IF YOU HAVE BEEN REFERRED TO A SPECIALIST, IT MAY TAKE 1-2 WEEKS TO SCHEDULE/PROCESS THE REFERRAL. IF YOU HAVE NOT HEARD FROM US/SPECIALIST IN TWO WEEKS, PLEASE GIVE [place] A CALL AT [phone] X [phone].   THE PATIENT IS ENCOURAGED TO PRACTICE SOCIAL DISTANCING DUE TO THE COVID-19 PANDEMIC.

## 2022-08-29 LAB — LIPID PANEL
Chol/HDL Ratio: 2.9 ratio (ref 0.0–4.4)
Cholesterol, Total: 220 mg/dL — ABNORMAL HIGH (ref 100–199)
HDL: 77 mg/dL (ref >39)
LDL Chol Calc (NIH): 129 mg/dL — ABNORMAL HIGH (ref 0–99)
Triglycerides: 82 mg/dL (ref 0–149)
VLDL Cholesterol Cal: 14 mg/dL (ref 5–40)

## 2022-08-29 LAB — CMP14+EGFR
ALT: 16 IU/L (ref 0–32)
AST: 16 IU/L (ref 0–40)
Albumin/Globulin Ratio: 1.7 (ref 1.2–2.2)
Albumin: 4.3 g/dL (ref 3.9–4.9)
Alkaline Phosphatase: 84 IU/L (ref 44–121)
BUN/Creatinine Ratio: 15 (ref 12–28)
BUN: 17 mg/dL (ref 8–27)
Bilirubin Total: 0.2 mg/dL (ref 0.0–1.2)
CO2: 24 mmol/L (ref 20–29)
Calcium: 9.5 mg/dL (ref 8.7–10.3)
Chloride: 105 mmol/L (ref 96–106)
Creatinine, Ser: 1.11 mg/dL — ABNORMAL HIGH (ref 0.57–1.00)
Globulin, Total: 2.5 g/dL (ref 1.5–4.5)
Glucose: 91 mg/dL (ref 70–99)
Potassium: 4.5 mmol/L (ref 3.5–5.2)
Sodium: 142 mmol/L (ref 134–144)
Total Protein: 6.8 g/dL (ref 6.0–8.5)
eGFR: 57 mL/min/{1.73_m2} — ABNORMAL LOW (ref >59)

## 2022-08-29 LAB — HEMOGLOBIN A1C
Est. average glucose Bld gHb Est-mCnc: 131 mg/dL
Hgb A1c MFr Bld: 6.2 % — ABNORMAL HIGH (ref 4.8–5.6)

## 2022-09-27 ENCOUNTER — Ambulatory Visit: Payer: PRIVATE HEALTH INSURANCE | Admitting: Family Medicine

## 2022-09-27 VITALS — BP 129/81 | HR 58 | Ht 66.0 in | Wt 185.8 lb

## 2022-09-27 DIAGNOSIS — R7303 Prediabetes: Secondary | ICD-10-CM | POA: Diagnosis not present

## 2022-09-27 DIAGNOSIS — E785 Hyperlipidemia, unspecified: Secondary | ICD-10-CM | POA: Diagnosis not present

## 2022-09-27 DIAGNOSIS — L661 Lichen planopilaris: Secondary | ICD-10-CM

## 2022-09-27 NOTE — Progress Notes (Signed)
   Subjective:    Patient ID: [name], female    DOB: [birth year], 62 y.o.   MRN: [id number]  HPI Patient arrives today to go over test results.  The 10-year ASCVD risk score (Arnett DK, et al., 2019) is: 6.8%   Values used to calculate the score:     Age: 62 years     Sex: Female     Is Non-Hispanic African American: Yes     Diabetic: No     Tobacco smoker: No     Systolic Blood Pressure: 129 mmHg     Is BP treated: Yes     HDL Cholesterol: 77 mg/dL     Total Cholesterol: 220 mg/dL  She comes in today she [facility] they put her on hydroxychloroquine as well as pro scar she would like our opinion on this In addition to this she had numerous labs drawn by menopausal doctor and was instructed to follow-up with [place] for further discussion She tries to eat relatively healthy tries to fit in some exercise   Review of Systems     Objective:   Physical Exam General-in no acute distress Eyes-no discharge Lungs-respiratory rate normal, CTA CV-no murmurs,RRR Extremities skin warm dry no edema Neuro grossly normal Behavior normal, alert   We did discuss the benefits and risk of medication for cholesterol We also discussed stratification by doing a coronary calcium  Repeat lab work later this year    Assessment & Plan:  Hyperlipidemia-LDL goal below 70 if possible Talked about risk for heart disease Patient agrees with coronary calcium score test Will proceed forward with doing this Potentially will need to start on statin Minimize starches in the diet regular physical activity try to bring weight down As for the medicine the dermatology folks are proposing the seem reasonable but patient was told if she does not see significant benefit within 4 to 6 months she may want to reconsider  Prediabetes healthy diet minimize starches relook at this again in the fall  Mild obesity patient working hard with healthy diet regular activity to fit in muscle activity  twice per week and cardio several times a week

## 2022-10-02 ENCOUNTER — Other Ambulatory Visit: Payer: Self-pay | Admitting: Nurse Practitioner

## 2022-10-05 ENCOUNTER — Other Ambulatory Visit: Payer: Self-pay | Admitting: Nurse Practitioner

## 2022-10-26 ENCOUNTER — Encounter (HOSPITAL_COMMUNITY): Payer: Self-pay | Admitting: Physical Therapy

## 2022-10-26 NOTE — Therapy (Signed)
PHYSICAL THERAPY DISCHARGE SUMMARY  Visits from Start of Care: 1  Current functional level related to goals / functional outcomes: unknown   Remaining deficits: unknown   Education / Equipment: HEP   Patient agrees to discharge. Patient goals were not met. Patient is being discharged due to not returning since the last visit. Pt only came for evaluation only.   Virgina Organ, PT CLT [phone]

## 2022-10-31 ENCOUNTER — Other Ambulatory Visit: Payer: Self-pay | Admitting: Nurse Practitioner

## 2022-11-23 ENCOUNTER — Ambulatory Visit (HOSPITAL_COMMUNITY)
Admission: RE | Admit: 2022-11-23 | Discharge: 2022-11-23 | Disposition: A | Discharge status: Home / Self Care | Payer: PRIVATE HEALTH INSURANCE | Source: Ambulatory Visit | Attending: Family Medicine | Admitting: Family Medicine

## 2022-11-23 DIAGNOSIS — E785 Hyperlipidemia, unspecified: Secondary | ICD-10-CM | POA: Insufficient documentation

## 2022-12-08 ENCOUNTER — Encounter: Payer: Self-pay | Admitting: Family Medicine

## 2022-12-08 NOTE — Telephone Encounter (Signed)
Is Please go ahead and put in a referral to Dr. Vassie Loll  pulmonologist  Please let Addilyne know that unless she desires to make that appointment we can do the referral and they will connect with her

## 2022-12-11 NOTE — Telephone Encounter (Signed)
Patient has appointment with pulmonology- Dr Sherene Sires 12/15/22

## 2022-12-15 ENCOUNTER — Encounter: Payer: Self-pay | Admitting: Internal Medicine

## 2022-12-15 ENCOUNTER — Ambulatory Visit (INDEPENDENT_AMBULATORY_CARE_PROVIDER_SITE_OTHER): Payer: PRIVATE HEALTH INSURANCE | Admitting: Internal Medicine

## 2022-12-15 VITALS — BP 119/71 | HR 65 | Ht 66.0 in | Wt 187.8 lb

## 2022-12-15 DIAGNOSIS — R0609 Other forms of dyspnea: Secondary | ICD-10-CM | POA: Diagnosis not present

## 2022-12-15 NOTE — Patient Instructions (Signed)
Please remember to go to the lab department   for your tests - we will call you with the results when they are available.      My office will be contacting you by phone for referral to echocardiogram  - if you don't hear back from my office within one week please call us back or notify us thru MyChart and we'll address it right away.   I will call you when all these studies are back and go from there.  No changes needed for now in medications.

## 2022-12-15 NOTE — Progress Notes (Signed)
[name], female    DOB: February 27, 1961    MRN: [id number]   Brief patient profile:  62  yobf  never smoker born prematurely but no apparent sequelae then w/in a year or two of moving to [place]  @ age 62 noted year round tendency to URIs/bronchitis referred to pulmonary clinic in [place]  12/15/2022 by Dr Gerda Diss  for abnormal Coronary CT 11/23/22    Covid x 2 but fine p both     History of Present Illness  12/15/2022  Pulmonary/ 1st office eval/ [facility]i [facility] / [place] [facility]  Chief Complaint  Patient presents with   Consult  Dyspnea:  onset x 2 weeks walking dog more doe assoc with R > L leg swelling Cough: at hs x one  Sleep: blocks under bed / sleep study neg  SABA use: used only with covid and none  02: none   No obvious day to day or daytime pattern/variability or assoc excess/ purulent sputum or mucus plugs or hemoptysis or cp or chest tightness, subjective wheeze or overt sinus or hb symptoms.   *** without nocturnal  or early am exacerbation  of respiratory  c/o's or need for noct saba. Also denies any obvious fluctuation of symptoms with weather or environmental changes or other aggravating or alleviating factors except as outlined above   No unusual exposure hx or h/o childhood pna/ asthma or knowledge of premature birth.  Current Allergies, Complete Past Medical History, Past Surgical History, Family History, and Social History were reviewed in [employer] record.  ROS  The following are not active complaints unless bolded Hoarseness, sore throat, dysphagia, dental problems, itching, sneezing,  nasal congestion or discharge of excess mucus or purulent secretions, ear ache,   fever, chills, sweats, unintended wt loss or wt gain, classically pleuritic or exertional cp,  orthopnea pnd or arm/hand swelling  or leg swelling, presyncope, palpitations, abdominal pain, anorexia, nausea, vomiting, diarrhea  or change in bowel habits or change in bladder habits,  change in stools or change in urine, dysuria, hematuria,  rash, arthralgias, visual complaints, headache, numbness, weakness or ataxia or problems with walking or coordination,  change in mood or  memory. Alopecia              Outpatient Medications Prior to Visit  Medication Sig Dispense Refill   ALPRAZolam (XANAX) 0.5 MG tablet TAKE (1/2) TO (1) TABLET TWICE DAILY AS NEEDED. 30 tablet 0   cetirizine (ZYRTEC) 10 MG tablet Take 10 mg by mouth daily as needed for allergies.     escitalopram (LEXAPRO) 20 MG tablet TAKE 1 TABLET BY MOUTH ONCE DAILY. 90 tablet 0   estradiol (VIVELLE-DOT) 0.025 MG/24HR Place 1 patch onto the skin 2 (two) times a week. 24 patch 1   finasteride (PROSCAR) 5 MG tablet Take 2.5 mg by mouth daily.     fluticasone (FLONASE) 50 MCG/ACT nasal spray Place 2 sprays into both nostrils daily. (Patient taking differently: Place 2 sprays into both nostrils daily as needed for allergies.) 16 g 0   Ginkgo Biloba 60 MG CAPS Take by mouth.     hydrochlorothiazide (HYDRODIURIL) 25 MG tablet Take 1 tablet (25 mg total) by mouth daily. Prn swelling 90 tablet 0   hydroxychloroquine (PLAQUENIL) 200 MG tablet Take 200 mg by mouth daily.     ibuprofen (ADVIL) 200 MG tablet Take 200 mg by mouth every 6 (six) hours as needed for mild pain.     NIFEdipine (PROCARDIA-XL/NIFEDICAL-XL) 30 MG  24 hr tablet TAKE (1) TABLET BY MOUTH ONCE DAILY. 90 tablet 0   Omega-3 1000 MG CAPS Take 1,000 mg by mouth daily.     OVER THE COUNTER MEDICATION Take 15 mLs by mouth at bedtime. Rejuvicare collagen liquid     Probiotic Product (ALIGN) 4 MG CAPS Take 4 mg by mouth daily.     progesterone (PROMETRIUM) 100 MG capsule Take 200 mg by mouth See admin instructions. Take 200 mg 6 days a week skipping Sunday dose     VITAMIN D PO Take 1 capsule by mouth daily.     albuterol (VENTOLIN HFA) 108 (90 Base) MCG/ACT inhaler Inhale 1-2 puffs into the lungs every 6 (six) hours as needed for wheezing or shortness of breath.  8 g 0   pregabalin (LYRICA) 25 MG capsule Take 1 capsule (25 mg total) by mouth 2 (two) times daily. (Patient taking differently: Take 25 mg by mouth 2 (two) times daily. As needed) 60 capsule 1   promethazine-dextromethorphan (PROMETHAZINE-DM) 6.25-15 MG/5ML syrup Take 5 mLs by mouth 4 (four) times daily as needed for cough. 118 mL 0   No facility-administered medications prior to visit.    Past Medical History:  Diagnosis Date   Current use of estrogen therapy 02/24/2015   Depressed 04/21/2015   Gastroesophageal reflux disease    Hair loss 02/24/2015   Hot flashes 02/24/2015   Hypertension       Objective:     BP 119/71   Pulse 65   Ht 5\' 6"  (1.676 m)   Wt 187 lb 12.8 oz (85.2 kg)   SpO2 97%   BMI 30.31 kg/m   SpO2: 97 % RA   Pleasant amb bf nad wearing wig    HEENT : Oropharynx  clear     Nasal turbinates nl    NECK :  without  apparent JVD/ palpable Nodes/TM    LUNGS: no acc muscle use,  Nl contour chest which is clear to A and P bilaterally without cough on insp or exp maneuvers   CV:  RRR  no s3 or murmur - No increase in P2, and no edema   ABD:  soft and nontender    MS:  Nl gait/ ext warm without deformities Or obvious joint restrictions  calf tenderness, cyanosis or clubbing    SKIN: warm and dry without lesions    NEURO:  alert, approp, nl sensorium with  no motor or cerebellar deficits apparent.     I personally reviewed images and agree with radiology impression as follows:   Chest CT  11/23/22   Enlarged pulmonic trunk, indicative of pulmonary arterial hypertension.      Assessment   No problem-specific Assessment & Plan notes found for this encounter.     Sandrea Hughs, MD 12/15/2022

## 2022-12-16 ENCOUNTER — Encounter: Payer: Self-pay | Admitting: Internal Medicine

## 2022-12-16 NOTE — Assessment & Plan Note (Addendum)
Onset ?  More fatigue than doe  - Coronary CT 11/23/22  with nl lungs but enlarged PA  - 12/15/2022   Walked on RA  x  2  lap(s) =  approx 300  ft  @ brisk pace, stopped due to sob/fatigue with lowest 02 sats 98% then completed another 150 ft with lowest sat 97%  - 2d Echo ordered 12/15/2022   Symptoms are   disproportionate to objective findings and not clear to what extent this is actually a pulmonary  problem but pt does appear to have difficult to sort out respiratory symptoms of unknown origin for which  DDX  = almost all start with A and  include Adherence, Ace Inhibitors, Acid Reflux, Active Sinus Disease, Alpha 1 Antitripsin deficiency, Anxiety masquerading as Airways dz,  ABPA,  Allergy(esp in young), Aspiration (esp in elderly), Adverse effects of meds,  Active smoking or Vaping, A bunch of PE's/clot burden (a few small clots can't cause this syndrome unless there is already severe underlying pulm or vascular dz with poor reserve),  Anemia or thyroid disorder, plus two Bs  = Bronchiectasis and Beta blocker use..and one C= CHF    Bolded dx's most likely concerns here   Adherence is always the initial "prime suspect" and is a multilayered concern that requires a "trust but verify" approach in every patient - starting with knowing how to use medications, especially inhalers, correctly, keeping up with refills and understanding the fundamental difference between maintenance and prns vs those medications only taken for a very short course and then stopped and not refilled.  - if not improving will rec with all meds in hand using a trust but verify approach to confirm accurate Medication  Reconciliation The principal here is that until we are certain that the  patients are doing what we've asked, it makes no sense to ask them to do more.   ? Allergy/ asthma > no assoc rhinitis or noct/ early am symptoms though she does have a h/o recurrent bronchitis and has saba but only used p saba > consider return  and with hfa/ teach technique and try saba pre exertion >>>  check eos/IgE screen  ? Anemia/ thyroid dz> tsh nl a year ago, may need to be repeated in not done since >>> check hgb   ? Anxiety/depression/deconditioning  > usually at the bottom of this list of usual suspects but s  note already on psychotropics and may interfere with adherence and also interpretation of response or lack thereof to symptom management which can be quite subjective.  >>> defer to PCP   ?   A bunch of PEs / CETPH > pt is on estrogen but just physiologic doses Note while a normal  or high normal value (seen commonly in the elderly or chronically ill)  may miss small peripheral pe, the clot burden with sob is moderately high and the d dimer  has a very high neg pred value if used in this setting.   >>> check d Dimer   ? CHF/ PAH >  dilated PA on CT common finding and usually does not correlate with PH in my experience  >>> check echo  Discussed in detail all the  indications, usual  risks and alternatives  relative to the benefits with patient who agrees to proceed with w/u as outlined.     Each maintenance medication was reviewed in detail including emphasizing most importantly the difference between maintenance and prns and under what circumstances the prns  are to be triggered using an action plan format where appropriate.  Total time for H and P, chart review, counseling,  directly observing portions of ambulatory 02 saturation study/ and generating customized AVS unique to this office visit / same day charting > 45 min new pt eval

## 2022-12-19 LAB — BASIC METABOLIC PANEL
BUN/Creatinine Ratio: 15 (ref 12–28)
BUN: 20 mg/dL (ref 8–27)
CO2: 24 mmol/L (ref 20–29)
Calcium: 9.6 mg/dL (ref 8.7–10.3)
Chloride: 104 mmol/L (ref 96–106)
Creatinine, Ser: 1.33 mg/dL — ABNORMAL HIGH (ref 0.57–1.00)
Glucose: 97 mg/dL (ref 70–99)
Potassium: 4.2 mmol/L (ref 3.5–5.2)
Sodium: 141 mmol/L (ref 134–144)
eGFR: 45 mL/min/{1.73_m2} — ABNORMAL LOW (ref >59)

## 2022-12-19 LAB — D-DIMER, QUANTITATIVE: D-DIMER: 0.35 mg/L FEU (ref 0.00–0.49)

## 2022-12-19 LAB — CBC WITH DIFFERENTIAL/PLATELET
Basophils Absolute: 0.1 10*3/uL (ref 0.0–0.2)
Basos: 1 %
EOS (ABSOLUTE): 0.1 10*3/uL (ref 0.0–0.4)
Eos: 1 %
Hematocrit: 39.1 % (ref 34.0–46.6)
Hemoglobin: 13 g/dL (ref 11.1–15.9)
Immature Grans (Abs): 0 10*3/uL (ref 0.0–0.1)
Immature Granulocytes: 0 %
Lymphocytes Absolute: 1.8 10*3/uL (ref 0.7–3.1)
Lymphs: 26 %
MCH: 29.1 pg (ref 26.6–33.0)
MCHC: 33.2 g/dL (ref 31.5–35.7)
MCV: 88 fL (ref 79–97)
Monocytes Absolute: 0.5 10*3/uL (ref 0.1–0.9)
Monocytes: 7 %
Neutrophils Absolute: 4.7 10*3/uL (ref 1.4–7.0)
Neutrophils: 65 %
Platelets: 257 10*3/uL (ref 150–450)
RBC: 4.47 x10E6/uL (ref 3.77–5.28)
RDW: 13.4 % (ref 11.7–15.4)
WBC: 7.1 10*3/uL (ref 3.4–10.8)

## 2022-12-19 LAB — IGE: IgE (Immunoglobulin E), Serum: 31 IU/mL (ref 6–495)

## 2022-12-19 LAB — BRAIN NATRIURETIC PEPTIDE: BNP: 34.2 pg/mL (ref 0.0–100.0)

## 2023-01-01 ENCOUNTER — Ambulatory Visit: Payer: 59 | Admitting: Internal Medicine

## 2023-01-01 ENCOUNTER — Encounter: Payer: Self-pay | Admitting: Internal Medicine

## 2023-01-01 VITALS — BP 126/82 | HR 57 | Temp 98.4°F | Ht 66.0 in | Wt 187.8 lb

## 2023-01-01 DIAGNOSIS — E6609 Other obesity due to excess calories: Secondary | ICD-10-CM

## 2023-01-01 DIAGNOSIS — Z6830 Body mass index (BMI) 30.0-30.9, adult: Secondary | ICD-10-CM | POA: Diagnosis not present

## 2023-01-01 DIAGNOSIS — Z2821 Immunization not carried out because of patient refusal: Secondary | ICD-10-CM

## 2023-01-01 DIAGNOSIS — N951 Menopausal and female climacteric states: Secondary | ICD-10-CM | POA: Diagnosis not present

## 2023-01-01 DIAGNOSIS — I10 Essential (primary) hypertension: Secondary | ICD-10-CM | POA: Diagnosis not present

## 2023-01-01 NOTE — Progress Notes (Unsigned)
I,[name], CMA,acting as a Neurosurgeon for [name], MD.,have documented all relevant documentation on the behalf of [name], MD,as directed by  [name], MD while in the presence of [name], MD.  Subjective:  Patient ID: [name] , female    DOB: 11/11/1960 , 62 y.o.   MRN: [id number]  Chief Complaint  Patient presents with   BHRT    HPI  She is here today for f/u BHRT. She is currently using estradiol patches twice weekly. She is also taking progesterone nightly. Patient has no other concerns. She is hesitant about weaning off of her hormones. She feels well on her current regimen.     Hypertension This is a chronic problem. The current episode started more than 1 year ago. The problem has been gradually improving since onset. Pertinent negatives include no blurred vision, chest pain, palpitations or shortness of breath. Risk factors for coronary artery disease include sedentary lifestyle and post-menopausal state. Past treatments include diuretics and calcium channel blockers. The current treatment provides moderate improvement.     Past Medical History:  Diagnosis Date   Current use of estrogen therapy 02/24/2015   Depressed 04/21/2015   Gastroesophageal reflux disease    Hair loss 02/24/2015   Hot flashes 02/24/2015   Hypertension      Family History  Problem Relation Age of Onset   Diabetes type II Mother    Coronary artery disease Mother    Diabetes type II Father    Coronary artery disease Father    Hypertension Father    Diabetes type II Sister    Kidney disease Sister    Diabetes Brother    Thyroid disease Sister    Hypertension Sister    Hypertension Sister    Hypertension Brother    Colon cancer Neg Hx    Colon polyps Neg Hx    Breast cancer Neg Hx    Ovarian cancer Neg Hx    Uterine cancer Neg Hx    Ulcerative colitis Neg Hx    Celiac disease Neg Hx    Crohn's disease Neg Hx      Current Outpatient Medications:     ALPRAZolam (XANAX) 0.5 MG tablet, TAKE (1/2) TO (1) TABLET TWICE DAILY AS NEEDED., Disp: 30 tablet, Rfl: 0   cetirizine (ZYRTEC) 10 MG tablet, Take 10 mg by mouth daily as needed for allergies., Disp: , Rfl:    estradiol (VIVELLE-DOT) 0.025 MG/24HR, Place 1 patch onto the skin 2 (two) times a week., Disp: 24 patch, Rfl: 1   finasteride (PROSCAR) 5 MG tablet, Take 2.5 mg by mouth daily., Disp: , Rfl:    fluticasone (FLONASE) 50 MCG/ACT nasal spray, Place 2 sprays into both nostrils daily. (Patient taking differently: Place 2 sprays into both nostrils daily as needed for allergies.), Disp: 16 g, Rfl: 0   Ginkgo Biloba 60 MG CAPS, Take by mouth., Disp: , Rfl:    hydrochlorothiazide (HYDRODIURIL) 25 MG tablet, Take 1 tablet (25 mg total) by mouth daily. Prn swelling, Disp: 90 tablet, Rfl: 0   hydroxychloroquine (PLAQUENIL) 200 MG tablet, Take 200 mg by mouth daily., Disp: , Rfl:    ibuprofen (ADVIL) 200 MG tablet, Take 200 mg by mouth every 6 (six) hours as needed for mild pain., Disp: , Rfl:    NIFEdipine (PROCARDIA-XL/NIFEDICAL-XL) 30 MG 24 hr tablet, TAKE (1) TABLET BY MOUTH ONCE DAILY., Disp: 90 tablet, Rfl: 0   Omega-3 1000 MG CAPS, Take 1,000 mg  by mouth daily., Disp: , Rfl:    OVER THE COUNTER MEDICATION, Take 15 mLs by mouth at bedtime. Rejuvicare collagen liquid, Disp: , Rfl:    Probiotic Product (ALIGN) 4 MG CAPS, Take 4 mg by mouth daily., Disp: , Rfl:    progesterone (PROMETRIUM) 100 MG capsule, Take 200 mg by mouth See admin instructions. Take 200 mg 6 days a week skipping Sunday dose, Disp: , Rfl:    VITAMIN D PO, Take 1 capsule by mouth daily., Disp: , Rfl:    escitalopram (LEXAPRO) 20 MG tablet, TAKE 1 TABLET BY MOUTH ONCE DAILY., Disp: 90 tablet, Rfl: 0   Allergies  Allergen Reactions   Hydrocodone Itching   Lisinopril     Headache and dizziness   Metronidazole Hives   Norvasc [Amlodipine Besylate]     Unknown reaction    Plaquenil [Hydroxychloroquine Sulfate] Hives and Itching      Review of Systems  Constitutional: Negative.   Eyes:  Negative for blurred vision.  Respiratory: Negative.  Negative for shortness of breath.   Cardiovascular: Negative.  Negative for chest pain and palpitations.  Neurological: Negative.   Psychiatric/Behavioral: Negative.       Today's Vitals   01/01/23 1127  BP: 126/82  Pulse: (!) 57  Temp: 98.4 F (36.9 C)  SpO2: 98%  Weight: 187 lb 12.8 oz (85.2 kg)  Height: 5\' 6"  (1.676 m)   Body mass index is 30.31 kg/m.  Wt Readings from Last 3 Encounters:  01/01/23 187 lb 12.8 oz (85.2 kg)  12/15/22 187 lb 12.8 oz (85.2 kg)  09/27/22 185 lb 12.8 oz (84.3 kg)    BP Readings from Last 3 Encounters:  01/01/23 126/82  12/15/22 119/71  09/27/22 129/81     Objective:  Physical Exam Vitals and nursing note reviewed.  Constitutional:      Appearance: Normal appearance.  HENT:     Head: Normocephalic and atraumatic.  Eyes:     Extraocular Movements: Extraocular movements intact.  Cardiovascular:     Rate and Rhythm: Normal rate and regular rhythm.     Heart sounds: Normal heart sounds.  Pulmonary:     Effort: Pulmonary effort is normal.     Breath sounds: Normal breath sounds.  Skin:    General: Skin is warm.  Neurological:     General: No focal deficit present.     Mental Status: She is alert.  Psychiatric:        Mood and Affect: Mood normal.        Behavior: Behavior normal.         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 Advised to decrease Minivelle usage to once patch weekly.  She agrees to rto in 3-4 months for re-evaluation.    Essential hypertension, benign Assessment & Plan: Chronic, goal BP < 120/80.  She will continue with hydrochlorothiazide and nifedipine daily as per PCP. She is encouraegd to follow low sodium  diet.   Class 1 obesity due to excess calories with serious comorbidity and body mass index (BMI) of 30.0 to 30.9 in adult Assessment & Plan: Her Bmi is acceptable for her demographics. She is encouraged to aim for at least 150 minutes of exercise/week.    Herpes zoster vaccination declined  She is encouraged to strive for BMI less than 30 to decrease cardiac risk. Advised to  aim for at least 150 minutes of exercise per week.    Return for 4 MONTH bhrt F/U.Marland Kitchen  Patient was given opportunity to ask questions. Patient verbalized understanding of the plan and was able to repeat key elements of the plan. All questions were answered to their satisfaction.   I, [name], MD, have reviewed all documentation for this visit. The documentation on 01/01/23 for the exam, diagnosis, procedures, and orders are all accurate and complete.   IF YOU HAVE BEEN REFERRED TO A SPECIALIST, IT MAY TAKE 1-2 WEEKS TO SCHEDULE/PROCESS THE REFERRAL. IF YOU HAVE NOT HEARD FROM US/SPECIALIST IN TWO WEEKS, PLEASE GIVE [place] A CALL AT [phone].   THE PATIENT IS ENCOURAGED TO PRACTICE SOCIAL DISTANCING DUE TO THE COVID-19 PANDEMIC.

## 2023-01-02 ENCOUNTER — Other Ambulatory Visit: Payer: Self-pay | Admitting: Family Medicine

## 2023-01-13 ENCOUNTER — Encounter: Payer: Self-pay | Admitting: Internal Medicine

## 2023-01-13 DIAGNOSIS — Z6832 Body mass index (BMI) 32.0-32.9, adult: Secondary | ICD-10-CM | POA: Insufficient documentation

## 2023-01-13 DIAGNOSIS — E6609 Other obesity due to excess calories: Secondary | ICD-10-CM | POA: Insufficient documentation

## 2023-01-13 DIAGNOSIS — E66811 Obesity, class 1: Secondary | ICD-10-CM | POA: Insufficient documentation

## 2023-01-13 NOTE — Assessment & Plan Note (Signed)
Her Bmi is acceptable for her demographics. She is encouraged to aim for at least 150 minutes of exercise/week.

## 2023-01-13 NOTE — Assessment & Plan Note (Signed)
Chronic, currently on Minivelle patches and compounded progesterone SR capsules, 175mg  nightly and agrees to decrease dose to 150mg  nightly, except Sundays. Advised to decrease Minivelle usage to once patch weekly.  She agrees to rto in 3-4 months for re-evaluation.

## 2023-01-13 NOTE — Assessment & Plan Note (Signed)
Chronic, goal BP < 120/80.  She will continue with hydrochlorothiazide and nifedipine daily as per PCP. She is encouraegd to follow low sodium diet.

## 2023-01-15 ENCOUNTER — Encounter: Payer: Self-pay | Admitting: Internal Medicine

## 2023-01-26 ENCOUNTER — Ambulatory Visit (HOSPITAL_COMMUNITY)
Admission: RE | Admit: 2023-01-26 | Discharge: 2023-01-26 | Disposition: A | Discharge status: Home / Self Care | Payer: No Typology Code available for payment source | Source: Ambulatory Visit | Attending: Internal Medicine | Admitting: Internal Medicine

## 2023-01-26 DIAGNOSIS — R0609 Other forms of dyspnea: Secondary | ICD-10-CM | POA: Insufficient documentation

## 2023-01-26 LAB — ECHOCARDIOGRAM COMPLETE
AR max vel: 2.34 cm2
AV Area VTI: 2.52 cm2
AV Area mean vel: 2.44 cm2
AV Mean grad: 1 mmHg
AV Peak grad: 2.9 mmHg
Ao pk vel: 0.85 m/s
Area-P 1/2: 5.06 cm2
Est EF: 55
S' Lateral: 3.7 cm

## 2023-02-16 ENCOUNTER — Other Ambulatory Visit: Payer: Self-pay | Admitting: Nurse Practitioner

## 2023-02-20 ENCOUNTER — Other Ambulatory Visit: Payer: Self-pay | Admitting: Nurse Practitioner

## 2023-02-27 ENCOUNTER — Ambulatory Visit: Payer: No Typology Code available for payment source | Admitting: Family Medicine

## 2023-02-27 VITALS — BP 132/78 | HR 59 | Temp 98.4°F | Ht 66.0 in | Wt 192.6 lb

## 2023-02-27 DIAGNOSIS — R7303 Prediabetes: Secondary | ICD-10-CM

## 2023-02-27 DIAGNOSIS — R7989 Other specified abnormal findings of blood chemistry: Secondary | ICD-10-CM

## 2023-02-27 DIAGNOSIS — R0609 Other forms of dyspnea: Secondary | ICD-10-CM | POA: Diagnosis not present

## 2023-02-27 DIAGNOSIS — E785 Hyperlipidemia, unspecified: Secondary | ICD-10-CM | POA: Diagnosis not present

## 2023-02-27 DIAGNOSIS — I10 Essential (primary) hypertension: Secondary | ICD-10-CM

## 2023-02-27 NOTE — Progress Notes (Signed)
   Subjective:    Patient ID: [name], female    DOB: Sep 28, 1960, 62 y.o.   MRN: [id number]  HPI Pt is here today for 6 month follow up, pt would like to discuss pulmonary Dr and discuss option. Patient has noticed increased shortness of breath with activity.  She states she walks for a few minutes at most and gets out of breath and has to stop She denies any chest pressure tightness or pain Denies high fever chills or sweats She recently had coronary calcium which did not show any significant coronary calcium buildup In addition to this she also saw pulmonary doctor who did D-dimer CBC to rule out some other causes of DOE They did not do a pulmonary function test Patient relates remote history of asthma She also had echo which showed some diastolic dysfunction but only grade 1 She does have some trace pedal edema but not not anything severe  Review of Systems     Objective:   Physical Exam  General-in no acute distress Eyes-no discharge Lungs-respiratory rate normal, CTA CV-no murmurs,RRR Extremities skin warm dry minimal lower leg edema Neuro grossly normal Behavior normal, alert Acanthosis nigricans      Assessment & Plan:   1. DOE (dyspnea on exertion) We will touch base with asthma specialist to see if they are able to do pulmonary function test within their office as part of their evaluation if so we will help set her off  2. Hyperlipidemia, unspecified hyperlipidemia type Healthy diet regular activity recommended  3. Prediabetes Minimize starches in diet stay active portion control check A1c - Hemoglobin A1c  4. Elevated serum creatinine Become well-hydrated then check urine micro protein and creatinine level - Basic Metabolic Panel  5. Essential hypertension, benign Continue medicines as is - Microalbumin/Creatinine Ratio, Urine  I have reached out to a couple specialist regarding trying to work her issue up closer I doubt the diastolic dysfunction  is causing any severe DOE This could be deconditioning If she sees the asthma allergy specialist and they do not feel it is truly her lungs causing this issue we will engage in a regular walking program to help improve cardiovascular fitness

## 2023-03-03 LAB — BASIC METABOLIC PANEL
BUN/Creatinine Ratio: 16 (ref 12–28)
BUN: 16 mg/dL (ref 8–27)
CO2: 24 mmol/L (ref 20–29)
Calcium: 9.5 mg/dL (ref 8.7–10.3)
Chloride: 102 mmol/L (ref 96–106)
Creatinine, Ser: 0.98 mg/dL (ref 0.57–1.00)
Glucose: 93 mg/dL (ref 70–99)
Potassium: 4.3 mmol/L (ref 3.5–5.2)
Sodium: 139 mmol/L (ref 134–144)
eGFR: 65 mL/min/{1.73_m2} (ref >59)

## 2023-03-03 LAB — MICROALBUMIN / CREATININE URINE RATIO
Creatinine, Urine: 14.2 mg/dL
Microalb/Creat Ratio: <21 mg/g{creat} (ref 0–29)
Microalbumin, Urine: <3 ug/mL

## 2023-03-03 LAB — HEMOGLOBIN A1C
Est. average glucose Bld gHb Est-mCnc: 131 mg/dL
Hgb A1c MFr Bld: 6.2 % — ABNORMAL HIGH (ref 4.8–5.6)

## 2023-03-18 ENCOUNTER — Telehealth: Payer: Self-pay | Admitting: Family Medicine

## 2023-03-18 DIAGNOSIS — R0609 Other forms of dyspnea: Secondary | ICD-10-CM

## 2023-03-18 NOTE — Telephone Encounter (Signed)
Nurses Patient has been having some shortness of breath On her last visit we discussed being seen by asthma allergy specialist for further evaluation She is previously seen pulmonary Please go ahead with setting her up with Dr. Dellis Anes or one of his partners.  They may choose to do a pulmonary function test as well Please let the patient know that I did communicate with allergy asthma and they said that they would be willing to see her and evaluate her further thank you

## 2023-03-19 NOTE — Telephone Encounter (Signed)
See MyChart message

## 2023-04-03 ENCOUNTER — Other Ambulatory Visit: Payer: Self-pay | Admitting: Internal Medicine

## 2023-04-03 ENCOUNTER — Other Ambulatory Visit: Payer: Self-pay | Admitting: Family Medicine

## 2023-04-18 ENCOUNTER — Other Ambulatory Visit: Payer: Self-pay

## 2023-04-18 ENCOUNTER — Ambulatory Visit: Payer: 59 | Admitting: Allergy & Immunology

## 2023-04-18 ENCOUNTER — Encounter: Payer: Self-pay | Admitting: Allergy & Immunology

## 2023-04-18 VITALS — BP 128/72 | HR 61 | Temp 98.2°F | Resp 16 | Ht 65.75 in | Wt 191.2 lb

## 2023-04-18 DIAGNOSIS — J310 Chronic rhinitis: Secondary | ICD-10-CM | POA: Diagnosis not present

## 2023-04-18 DIAGNOSIS — R42 Dizziness and giddiness: Secondary | ICD-10-CM | POA: Diagnosis not present

## 2023-04-18 DIAGNOSIS — R0609 Other forms of dyspnea: Secondary | ICD-10-CM | POA: Diagnosis not present

## 2023-04-18 MED ORDER — ALBUTEROL SULFATE HFA 108 (90 BASE) MCG/ACT IN AERS: 2.0000 | INHALATION_SPRAY | RESPIRATORY_TRACT | 2 refills | Status: DC | PRN

## 2023-04-18 NOTE — Patient Instructions (Addendum)
1. DOE (dyspnea on exertion) - Lung testing was on the lower side, but it did improve with following the breathing treatment.  - Because of this, we are going to start a daily controller medication called Breztri. - Samples provided. Markus Daft contains an inhaled steroid to decrease inflammation in the lungs and two other medications to help keep your lungs open. - Spacer sample and demonstration provided.  - We will see how you are doing when you come back for your allergy testing.  - Daily controller medication(s): Breztri two puffs twice daily via spacer - Prior to physical activity: albuterol 2 puffs 10-15 minutes before physical activity. - Rescue medications: albuterol 4 puffs every 4-6 hours as needed - Asthma control goals:  * Full participation in all desired activities (may need albuterol before activity) * Albuterol use two time or less a week on average (not counting use with activity) * Cough interfering with sleep two time or less a month * Oral steroids no more than once a year * No hospitalizations  2. Vertigo and chronic rhinitis - We are going to do allergy testing at the next visit. - This is because insurance companies are no longer covering testing on the same day as the New Patient visit.  - This will help [place] to come up with a good plan of attack for your symptoms.   3. Return in about 1 week (around 04/25/2023) for SKIN TESTING.    Please inform us of any Emergency Department visits, hospitalizations, or changes in symptoms. Call us before going to the ED for breathing or allergy symptoms since we might be able to fit you in for a sick visit. Feel free to contact us anytime with any questions, problems, or concerns.  It was a pleasure to meet you today!  Websites that have reliable patient information: 1. American Academy of Asthma, Allergy, and Immunology: www.aaaai.org 2. Food Allergy Research and Education (FARE): foodallergy.org 3. Mothers of Asthmatics:  http://www.asthmacommunitynetwork.org 4. American College of Allergy, Asthma, and Immunology: www.acaai.org   COVID-19 Vaccine Information can be found at: PodExchange.nl For questions related to vaccine distribution or appointments, please email [email or web address] or call [phone].     "Like" [place] on Facebook and Instagram for our latest updates!      A healthy democracy works best when [employer] participate! Make sure you are registered to vote! If you have moved or changed any of your contact information, you will need to get this updated before voting! Scan the QR codes below to learn more!

## 2023-04-18 NOTE — Progress Notes (Signed)
NEW PATIENT  Date of Service/Encounter:  04/18/23  Consult requested by: Babs Sciara, MD   Assessment:   Chronic rhinitis - will plan for skin testing at the next visit  DOE (dyspnea on exertion) - with reversibility  Vertigo  Plan/Recommendations:   1. DOE (dyspnea on exertion) - Lung testing was on the lower side, but it did improve with following the breathing treatment.  - Because of this, we are going to start a daily controller medication called Breztri. - Samples provided. [name] contains an inhaled steroid to decrease inflammation in the lungs and two other medications to help keep your lungs open. - Spacer sample and demonstration provided.  - We will see how you are doing when you come back for your allergy testing.  - Daily controller medication(s): Breztri two puffs twice daily via spacer - Prior to physical activity: albuterol 2 puffs 10-15 minutes before physical activity. - Rescue medications: albuterol 4 puffs every 4-6 hours as needed - Asthma control goals:  * Full participation in all desired activities (may need albuterol before activity) * Albuterol use two time or less a week on average (not counting use with activity) * Cough interfering with sleep two time or less a month * Oral steroids no more than once a year * No hospitalizations  2. Vertigo and chronic rhinitis - We are going to do allergy testing at the next visit. - This is because insurance companies are no longer covering testing on the same day as the New Patient visit.  - This will help [place] to come up with a good plan of attack for your symptoms.   3. Return in about 1 week (around 04/25/2023) for SKIN TESTING.   This note in its entirety was forwarded to the Provider who requested this consultation.  Subjective:   [name] is a 62 y.o. female presenting today for evaluation of  Chief Complaint  Patient presents with   Shortness of Breath    Walking causes SOB, is  tired all time.    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  Discussed the use of AI scribe software for clinical note transcription with the patient and/or guardian, who gave verbal consent to proceed.  [name] was referred by Babs Sciara, MD.     [name] is a 62 y.o. female presenting for an evaluation of shortness of breath .  Asthma/Respiratory Symptom History: [name] presents with persistent shortness of breath that has been ongoing for the past few months. The dyspnea is constant, occurring both day and night, and is associated  with fatigue. Despite these symptoms, the patient denies any chest pain or swelling. Previously, the patient underwent cardiac testing due to a large pulmonary trunk appreciated on some type of imaging, but she had a cardiac CT in June 2024 that was completely unremarkable with a calcium score of 0. However, the specialist did not express concern and did not prescribe any medication or further workup. The patient also saw Dr. Sherene Sires in June 2024.  At that time, he felt that her dyspnea on  exertion was more fatigue rather than dyspnea.  It does not like she was actually started on any inhalers, not even albuterol to see if that would help.  She had an echocardiogram in August 2024 that showed an ejection fraction of 55% with normal left ventricular function.  Her right ventricular function was normal.  The mitral valve was normal.  There was no mitral regurgitation.  There was no mitral stenosis.  Aortic valve was not well-visualized and there were no aortic regurgitation or stenosis.  The patient denies any history of smoking or exposure to harmful fumes. 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 Despite the constant dyspnea, the patient does not report any worsening of symptoms on Sundays when they are in the church.  Allergic Rhinitis Symptom History: The patient also reports occasional sinus and ear infections and believes they have vertigo. They manage these symptoms with Zyrtec as needed. The patient also mentions a spot on their neck that feels 'dull.'  She has never undergone any allergy testing.  The patient was born prematurely and spent several months in the hospital after birth. They have two sons and four granddaughters. Prior to retirement, the patient worked for the city of [place].   Otherwise, there is no history of other atopic diseases, including food allergies, drug allergies, stinging insect allergies, or contact dermatitis. There is no significant infectious history. Vaccinations are up to date.    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   Anxiety and depression 09/06/2015   Chronic fatigue 09/06/2015   Elevated ferritin 08/08/2015   Prediabetes 04/30/2015   Current use of estrogen therapy 02/24/2015   Hot flashes 02/24/2015   Peripheral edema 11/19/2014   Essential hypertension, benign 10/25/2012    Medication List:  Allergies as of 04/18/2023       Reactions   Amoxicillin Diarrhea   Hydrocodone Itching   Lisinopril    Headache and dizziness   Metronidazole Hives   Norvasc [amlodipine Besylate]    Unknown reaction    Plaquenil [hydroxychloroquine Sulfate] Hives, Itching        Medication List        Accurate as of April 18, 2023 12:05 PM. If you have any questions, ask your nurse or doctor.          STOP taking these medications    finasteride 5 MG tablet Commonly known as: PROSCAR Stopped by: Alfonse Spruce   fluticasone 50 MCG/ACT nasal spray Commonly known as: FLONASE Stopped by:  Alfonse Spruce   hydroxychloroquine 200 MG tablet Commonly known as: PLAQUENIL Stopped by: Alfonse Spruce       TAKE these medications    albuterol 108 (90 Base) MCG/ACT inhaler Commonly known as: Ventolin HFA Inhale 2 puffs into the lungs every 4 (four) hours as needed for wheezing or shortness of breath. Started by: Alfonse Spruce   Align 4 MG Caps Take 4 mg by mouth daily.   ALPRAZolam 0.5 MG tablet Commonly known as: XANAX TAKE (1/2) TO (1) TABLET TWICE DAILY AS NEEDED.   cetirizine 10 MG tablet Commonly known as: ZYRTEC Take 10 mg by mouth daily as needed for allergies.   Dotti 0.025 MG/24HR Generic drug: estradiol PLACE 1 PATCH ONTO THE SKIN TWICE WEEKLY   escitalopram 20 MG tablet Commonly known as: LEXAPRO TAKE 1 TABLET BY MOUTH ONCE DAILY.   Ginkgo Biloba 60 MG Caps Take by mouth.   hydrochlorothiazide 25 MG tablet Commonly known as: HYDRODIURIL Take 1 tablet  (25 mg total) by mouth daily. Prn swelling   ibuprofen 200 MG tablet Commonly known as: ADVIL Take 200 mg by mouth every 6 (six) hours as needed for mild pain (pain score 1-3).   MULTIVITAMIN PO Take by mouth.   NIFEdipine 30 MG 24 hr tablet Commonly known as: PROCARDIA-XL/NIFEDICAL-XL TAKE (1) TABLET BY MOUTH ONCE DAILY.   Omega-3 1000 MG Caps Take 1,000 mg by mouth daily.   OVER THE COUNTER MEDICATION Take 15 mLs by mouth at bedtime. Rejuvicare collagen liquid   progesterone 100 MG capsule Commonly known as: PROMETRIUM Take 200 mg by mouth See admin instructions. Take 200 mg 6 days a week skipping Sunday dose   UNABLE TO FIND Med Name: Seamoss   VITAMIN D PO Take 1 capsule by mouth daily.        Birth History: born premature and spent time in the NICU  Developmental History: Areebah has met all milestones on time. She has required no speech therapy, occupational therapy, and physical therapy.   Past Surgical History: Past Surgical History:  Procedure Laterality Date   CHOLECYSTECTOMY     COLONOSCOPY  07/04/2011   sessile polyp in the mid transverse colon/moderate diverticulosis in the sigmoid to descending colon segments/internal hemorrhoids   COLONOSCOPY WITH PROPOFOL N/A 02/06/2022   Procedure: COLONOSCOPY WITH PROPOFOL;  Surgeon: Lanelle Bal, DO;  Location: [facility];  Service: Endoscopy;  Laterality: N/A;  10:45am, asa 2   ESOPHAGOGASTRODUODENOSCOPY     POLYPECTOMY  02/06/2022   Procedure: POLYPECTOMY;  Surgeon: Lanelle Bal, DO;  Location: [facility];  Service: Endoscopy;;   retrograde ureteral stone extraction     Total abdominal hysterectomy with probable salpingo-oophorectomy  2006-8   HEAVY MENSES/? CANCER   TUBAL LIGATION       Family History: Family History  Problem Relation Age of Onset   Diabetes type II Mother    Coronary artery disease Mother    Diabetes type II Father    Coronary artery disease Father    Hypertension Father     Diabetes type II Sister    Kidney disease Sister    Thyroid disease Sister    Hypertension Sister    Hypertension Sister    Asthma Sister    Diabetes Brother    Hypertension Brother    Colon cancer Neg Hx    Colon polyps Neg Hx    Breast cancer Neg Hx    Ovarian cancer Neg Hx    Uterine  cancer Neg Hx    Ulcerative colitis Neg Hx    Celiac disease Neg Hx    Crohn's disease Neg Hx      Social History: [name] lives at home with her family.  She lives in a house that is 62 years old.  There is linoleum throughout the home.  They have a heat pump for heating and cooling.  There is a dog inside of the home.  There are no dust mite covers on the bedding.  There is tobacco exposure in the house.  She is currently retired.  There is no fume, chemical, or dust exposure.  There is a HEPA filter in the home.   Review of systems otherwise negative other than that mentioned in the HPI.    Objective:   Blood pressure 128/72, pulse 61, temperature 98.2 F (36.8 C), resp. rate 16, height 5' 5.75" (1.67 m), weight 191 lb 4 oz (86.8 kg), SpO2 96%. Body mass index is 31.11 kg/m.     Physical Exam Vitals reviewed.  Constitutional:      Appearance: She is well-developed.     Comments: Pleasant.  Cooperative for the exam.  HENT:     Head: Normocephalic and atraumatic.     Right Ear: Tympanic membrane, ear canal and external ear normal. No drainage, swelling or tenderness. Tympanic membrane is not injected, scarred, erythematous, retracted or bulging.     Left Ear: Tympanic membrane, ear canal and external ear normal. No drainage, swelling or tenderness. Tympanic membrane is not injected, scarred, erythematous, retracted or bulging.     Nose: No nasal deformity, septal deviation, mucosal edema or rhinorrhea.     Right Turbinates: Enlarged, swollen and pale.     Left Turbinates: Enlarged, swollen and pale.     Right Sinus: No maxillary sinus tenderness or frontal sinus tenderness.      Left Sinus: No maxillary sinus tenderness or frontal sinus tenderness.     Mouth/Throat:     Mouth: Mucous membranes are not pale and not dry.     Pharynx: Uvula midline.  Eyes:     General:        Right eye: No discharge.        Left eye: No discharge.     Conjunctiva/sclera: Conjunctivae normal.     Right eye: Right conjunctiva is not injected. No chemosis.    Left eye: Left conjunctiva is not injected. No chemosis.    Pupils: Pupils are equal, round, and reactive to light.  Cardiovascular:     Rate and Rhythm: Normal rate and regular rhythm.     Heart sounds: Normal heart sounds.  Pulmonary:     Effort: Pulmonary effort is normal. No tachypnea, accessory muscle usage or respiratory distress.     Breath sounds: Normal breath sounds. No decreased breath sounds, wheezing, rhonchi or rales.     Comments: Somewhat of a heavy breather. Chest:     Chest wall: No tenderness.  Abdominal:     Tenderness: There is no abdominal tenderness. There is no guarding or rebound.  Lymphadenopathy:     Head:     Right side of head: No submandibular, tonsillar or occipital adenopathy.     Left side of head: No submandibular, tonsillar or occipital adenopathy.     Cervical: No cervical adenopathy.  Skin:    Coloration: Skin is not pale.     Findings: No abrasion, erythema, petechiae or rash. Rash is not papular, urticarial or vesicular.  Neurological:  Mental Status: She is alert.  Psychiatric:        Behavior: Behavior is cooperative.      Diagnostic studies:    Spirometry: results normal (FEV1: 1.70/79%, FVC: 2.41/88%, FEV1/FVC: 71%).    Spirometry consistent with normal pattern.  Patient received 4 puffs of albuterol presents but insignificant increase in her FEV1.  Her FVC increased 350 mL or 15%.  She did report feeling much better following the albuterol treatment.  Allergy Studies: deferred due to insurance stipulations that refuse to pay for testing at initial visits, making it  more difficult for patients to get the care they need          Malachi Bonds, MD [facility]

## 2023-04-25 ENCOUNTER — Other Ambulatory Visit: Payer: Self-pay

## 2023-04-25 ENCOUNTER — Encounter: Payer: Self-pay | Admitting: Allergy & Immunology

## 2023-04-25 ENCOUNTER — Ambulatory Visit: Payer: 59 | Admitting: Allergy & Immunology

## 2023-04-25 DIAGNOSIS — R0609 Other forms of dyspnea: Secondary | ICD-10-CM

## 2023-04-25 DIAGNOSIS — J310 Chronic rhinitis: Secondary | ICD-10-CM | POA: Diagnosis not present

## 2023-04-25 NOTE — Progress Notes (Signed)
FOLLOW UP  Date of Service/Encounter:  04/25/23   Assessment:   Chronic rhinitis - will plan for skin testing at the next visit   DOE (dyspnea on exertion) - with reversibility   Vertigo  Plan/Recommendations:   1. DOE (dyspnea on exertion) - Lung testing not done today. - Continue with the Breztri two puffs twice daily for now.  - We can send this in if you are interested.  - We will send you to a different Pulmonologist since you did not appreciate Dr. Sherene Sires.  - Daily controller medication(s): Breztri two puffs twice daily via spacer - Prior to physical activity: albuterol 2 puffs 10-15 minutes before physical activity. - Rescue medications: albuterol 4 puffs every 4-6 hours as needed - Asthma control goals:  * Full participation in all desired activities (may need albuterol before activity) * Albuterol use two time or less a week on average (not counting use with activity) * Cough interfering with sleep two time or less a month * Oral steroids no more than once a year * No hospitalizations  2. Vertigo and chronic rhinitis - Allergy testing was negative to the entire panel. - Copy of testing results provided today.  - Continue with cetirizine 10mg  daily and Flonase one spray per nostril daily.  3. Return in about 6 months (around 10/23/2023).   Subjective:   [name] is a 62 y.o. 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  Discussed the use of AI scribe software for clinical note transcription with the patient and/or guardian, who gave verbal consent to proceed.  [name] is a 62 y.o. female presenting for skin testing. She was last seen on October 30. We could not do testing because her insurance company does not cover testing on the same day as a New Patient visit. She has been off of all antihistamines 3 days in anticipation of the testing.   We did start her on a sample of Breztri 2 puffs twice daily for her dyspnea on exertion.  We continue with albuterol.  For her vertigo and chronic rhinitis, we decided to do allergy testing.  She does feel like the Breztri helped, but she still has some shortness of breath.  It is not very prevalent, but she has been using the Breztri 2 puffs twice a day.  She has another 1 at home, so she does not want it sent in right now.  She is open to seeing a different pulmonologist and she did not seem to get along with Dr.  Wert.  Otherwise, there have been no changes to her past medical history, surgical history, family history, or social history.    Review of systems otherwise negative other than that mentioned in the HPI.    Objective:    Physical exam deferred since this was a skin testing appointment only.   Diagnostic studies:   Allergy Studies:     Airborne Adult Perc - 04/25/23 1409     Time Antigen Placed 1409    Allergen Manufacturer Waynette Buttery    Location Back    Number of Test 55    1. Control-Buffer 50% Glycerol Negative    2. Control-Histamine 3+    3.  Bahia Negative    4. French Southern Territories Negative    5. Johnson Negative    6. Kentucky Blue Negative    7. Meadow Fescue Negative    8. Perennial Rye Negative    9. Timothy Negative    10. Ragweed Mix Negative    11. Cocklebur Negative    12. Plantain,  English Negative    13. Baccharis Negative    14. Dog Fennel Negative    15. Russian Thistle Negative    16. Lamb's Quarters Negative    17. Sheep Sorrell Negative    18. Rough Pigweed Negative    19. Marsh Elder, Rough Negative    20. Mugwort, Common Negative    21. Box, Elder Negative    22. Cedar, red Negative    23. Sweet Gum Negative    24. Pecan Pollen Negative    25. Pine Mix Negative    26. Walnut, Black Pollen Negative    27. Red Mulberry Negative    28. Ash Mix Negative    29. Birch Mix Negative    30. Beech American Negative    31. Cottonwood, Guinea-Bissau Negative    32. Hickory, White Negative    33. Maple Mix Negative    34. Oak, Guinea-[place]iss[place] Mix Negative    35. Sycamore Eastern Negative    36. Alternaria Alternata Negative    37. Cladosporium Herbarum Negative    38. Aspergillus Mix Negative    39. Penicillium Mix Negative    40. Bipolaris Sorokiniana (Helminthosporium) Negative    41. Drechslera Spicifera (Curvularia) Negative    42. Mucor Plumbeus Negative    43. Fusarium Moniliforme Negative    44. Aureobasidium Pullulans (pullulara) Negative    45. Rhizopus Oryzae Negative    46. Botrytis Cinera Negative    47. Epicoccum Nigrum Negative    48. Phoma Betae Negative    49. Dust Mite Mix Negative    50. Cat Hair 10,000 BAU/ml Negative    51.  Dog Epithelia Negative    52. Mixed Feathers Negative    53. Horse Epithelia Negative    54. Cockroach, German Negative    55. Tobacco Leaf Negative             Intradermal - 04/25/23 1418     Time Antigen Placed 1420    Allergen Manufacturer Waynette Buttery    Location Arm    Number of Test 16    Intradermal Select    Control Negative    Bahia Negative    French Southern Territories Negative     Johnson Negative    7 Grass Negative    Ragweed Mix Negative    Weed Mix Negative    Tree Mix Negative    Mold 1 Negative    Mold 2 Negative    Mold 3 Negative  Mold 4 Negative    Mite Mix Negative    Cat Negative    Dog Negative    Cockroach Negative             Allergy testing results were read and interpreted by myself, documented by clinical staff.      Malachi Bonds, MD  [facility]

## 2023-04-25 NOTE — Patient Instructions (Addendum)
1. DOE (dyspnea on exertion) - Lung testing not done today. - Continue with the Breztri two puffs twice daily for now.  - We can send this in if you are interested.  - We will send you to a different Pulmonologist since you did not appreciate Dr. Sherene Sires.  - Daily controller medication(s): Breztri two puffs twice daily via spacer - Prior to physical activity: albuterol 2 puffs 10-15 minutes before physical activity. - Rescue medications: albuterol 4 puffs every 4-6 hours as needed - Asthma control goals:  * Full participation in all desired activities (may need albuterol before activity) * Albuterol use two time or less a week on average (not counting use with activity) * Cough interfering with sleep two time or less a month * Oral steroids no more than once a year * No hospitalizations  2. Vertigo and chronic rhinitis - Allergy testing was negative to the entire panel. - Copy of testing results provided today.  - Continue with cetirizine 10mg  daily and Flonase one spray per nostril daily.  3. Return in about 6 months (around 10/23/2023).    Please inform us of any Emergency Department visits, hospitalizations, or changes in symptoms. Call us before going to the ED for breathing or allergy symptoms since we might be able to fit you in for a sick visit. Feel free to contact us anytime with any questions, problems, or concerns.  It was a pleasure to see you again today!  Websites that have reliable patient information: 1. American Academy of Asthma, Allergy, and Immunology: www.aaaai.org 2. Food Allergy Research and Education (FARE): foodallergy.org 3. Mothers of Asthmatics: http://www.asthmacommunitynetwork.org 4. American College of Allergy, Asthma, and Immunology: www.acaai.org   COVID-19 Vaccine Information can be found at: [email or web address] For questions related to vaccine distribution or appointments, please email  [email or web address] or call [phone].     "Like" [place] on Facebook and Instagram for our latest updates!      Airborne Adult Perc - 04/25/23 1409     Time Antigen Placed 1409    Allergen Manufacturer Waynette Buttery    Location Back    Number of Test 55    1. Control-Buffer 50% Glycerol Negative    2. Control-Histamine 3+    3. Bahia Negative    4. French Southern Territories Negative    5. Johnson Negative    6. Kentucky Blue Negative    7. Meadow Fescue Negative    8. Perennial Rye Negative    9. Timothy Negative    10. Ragweed Mix Negative    11. Cocklebur Negative    12. Plantain,  English Negative    13. Baccharis Negative    14. Dog Fennel Negative    15. Russian Thistle Negative    16. Lamb's Quarters Negative    17. Sheep Sorrell Negative    18. Rough Pigweed Negative    19. Marsh Elder, Rough Negative    20. Mugwort, Common Negative    21. Box, Elder Negative    22. Cedar, red Negative    23. Sweet Gum Negative    24. Pecan Pollen Negative    25. Pine Mix Negative    26. Walnut, Black Pollen Negative    27. Red Mulberry Negative    28. Ash Mix Negative    29. Birch Mix Negative    30. Beech American Negative    31. Cottonwood, Guinea-Bissau Negative    32. Hickory, White Negative    33. Maple Mix Negative    34. Oak, Guinea-Bissau Mix Negative  35. Sycamore Eastern Negative    36. Alternaria Alternata Negative    37. Cladosporium Herbarum Negative    38. Aspergillus Mix Negative    39. Penicillium Mix Negative    40. Bipolaris Sorokiniana (Helminthosporium) Negative    41. Drechslera Spicifera (Curvularia) Negative    42. Mucor Plumbeus Negative    43. Fusarium Moniliforme Negative    44. Aureobasidium Pullulans (pullulara) Negative    45. Rhizopus Oryzae Negative    46. Botrytis Cinera Negative    47. Epicoccum Nigrum Negative    48. Phoma Betae Negative    49. Dust Mite Mix Negative    50. Cat Hair 10,000 BAU/ml Negative    51.  Dog Epithelia Negative    52. Mixed Feathers  Negative    53. Horse Epithelia Negative    54. Cockroach, German Negative    55. Tobacco Leaf Negative             Intradermal - 04/25/23 1418     Time Antigen Placed 1420    Allergen Manufacturer Waynette Buttery    Location Arm    Number of Test 16    Intradermal Select    Control Negative    Bahia Negative    French Southern Territories Negative    Johnson Negative    7 Grass Negative    Ragweed Mix Negative    Weed Mix Negative    Tree Mix Negative    Mold 1 Negative    Mold 2 Negative    Mold 3 Negative    Mold 4 Negative    Mite Mix Negative    Cat Negative    Dog Negative    Cockroach Negative              Rhinitis (Hayfever) Overview  There are two types of rhinitis: allergic and non-allergic.  Allergic Rhinitis If you have allergic rhinitis, your immune system mistakenly identifies a typically harmless substance as an intruder. This substance is called an allergen. The immune system responds to the allergen by releasing histamine and chemical mediators that typically cause symptoms in the nose, throat, eyes, ears, skin and roof of the mouth.  Seasonal allergic rhinitis (hay fever) is most often caused by pollen carried in the air during different times of the year in different parts of the country.  Allergic rhinitis can also be triggered by common indoor allergens such as the dried skin flakes, urine and saliva found on pet dander, mold, droppings from dust mites and cockroach particles. This is called perennial allergic rhinitis, as symptoms typically occur year-round.  In addition to allergen triggers, symptoms may also occur from irritants such as smoke and strong odors, or to changes in the temperature and humidity of the air. This happens because allergic rhinitis causes inflammation in the nasal lining, which increases sensitivity to inhalants.  Many people with allergic rhinitis are prone to allergic conjunctivitis (eye allergy). In addition, allergic rhinitis can make  symptoms of asthma worse for people who suffer from both conditions.  Nonallergic Rhinitis At least one out of three people with rhinitis symptoms do not have allergies. Nonallergic rhinitis usually afflicts adults and causes year-round symptoms, especially runny nose and nasal congestion. This condition differs from allergic rhinitis because the immune system is not involved.

## 2023-05-02 ENCOUNTER — Telehealth: Payer: Self-pay

## 2023-05-02 NOTE — Telephone Encounter (Signed)
Called and spoke with the patient and she wants to go outside of [facility] for a 2nd Opinion.

## 2023-05-02 NOTE — Telephone Encounter (Signed)
-----   Message from Alfonse Spruce sent at 04/25/2023  2:56 PM EST ----- Hey there - can Tekeyah see someone else in the practice besides Dr. Sherene Sires? She just wants a different opinion.

## 2023-05-07 ENCOUNTER — Ambulatory Visit: Payer: 59 | Admitting: Internal Medicine

## 2023-05-07 ENCOUNTER — Encounter: Payer: Self-pay | Admitting: Internal Medicine

## 2023-05-07 VITALS — BP 118/78 | HR 76 | Temp 98.1°F | Ht 65.0 in | Wt 191.6 lb

## 2023-05-07 DIAGNOSIS — N951 Menopausal and female climacteric states: Secondary | ICD-10-CM

## 2023-05-07 DIAGNOSIS — E66811 Obesity, class 1: Secondary | ICD-10-CM

## 2023-05-07 DIAGNOSIS — E6609 Other obesity due to excess calories: Secondary | ICD-10-CM

## 2023-05-07 DIAGNOSIS — R0602 Shortness of breath: Secondary | ICD-10-CM

## 2023-05-07 DIAGNOSIS — Z6831 Body mass index (BMI) 31.0-31.9, adult: Secondary | ICD-10-CM

## 2023-05-07 DIAGNOSIS — R0609 Other forms of dyspnea: Secondary | ICD-10-CM | POA: Diagnosis not present

## 2023-05-07 NOTE — Progress Notes (Signed)
I,[name], CMA,acting as a Neurosurgeon for Anita Aliment, MD.,have documented all relevant documentation on the behalf of Anita Aliment, MD,as directed by  Anita Aliment, MD while in the presence of Anita Aliment, MD.  Subjective:  Patient ID: [name] , female    DOB: Nov 02, 1960 , 62 y.o.   MRN: [id number]  Chief Complaint  Patient presents with   BHRT    HPI  She is here today for f/u BHRT. She is currently using estradiol patches twice weekly. She is also taking progesterone nightly. Patient has no other concerns. She is hesitant about weaning off of her hormones. She feels well on her current regimen.     Hypertension This is a chronic problem. The current episode started more than 1 year ago. The problem has been gradually improving since onset. Associated symptoms include shortness of breath. Pertinent negatives include no blurred vision, chest pain or palpitations. Risk factors for coronary artery disease include sedentary lifestyle and post-menopausal state. Past treatments include diuretics and calcium channel blockers. The current treatment provides moderate improvement.     Past Medical History:  Diagnosis Date   Current use of estrogen therapy 02/24/2015   Depressed 04/21/2015   Gastroesophageal reflux disease    Hair loss 02/24/2015   Hot flashes 02/24/2015   Hypertension      Family History  Problem Relation Age of Onset   Diabetes type II Mother    Coronary artery disease Mother    Diabetes type II Father    Coronary artery disease Father    Hypertension Father    Diabetes type II Sister    Kidney disease Sister    Thyroid disease Sister    Hypertension Sister    Hypertension Sister    Asthma Sister    Diabetes Brother    Hypertension Brother    Colon cancer Neg Hx    Colon polyps Neg Hx    Breast cancer Neg Hx    Ovarian cancer Neg Hx    Uterine cancer Neg Hx    Ulcerative colitis Neg Hx    Celiac disease Neg Hx    Crohn's disease Neg  Hx      Current Outpatient Medications:    albuterol (VENTOLIN HFA) 108 (90 Base) MCG/ACT inhaler, Inhale 2 puffs into the lungs every 4 (four) hours as needed for wheezing or shortness of breath., Disp: 18 g, Rfl: 2   ALPRAZolam (XANAX) 0.5 MG tablet, TAKE (1/2) TO (1) TABLET TWICE DAILY AS NEEDED., Disp: 30 tablet, Rfl: 0   cetirizine (ZYRTEC) 10 MG tablet, Take 10 mg by mouth daily as needed for allergies., Disp: , Rfl:    DOTTI 0.025 MG/24HR, PLACE 1 PATCH ONTO THE SKIN TWICE WEEKLY, Disp: 24 patch, Rfl: 1   escitalopram (LEXAPRO) 20 MG tablet, TAKE 1 TABLET BY MOUTH ONCE DAILY., Disp: 90 tablet, Rfl: 0   Ginkgo Biloba 60 MG CAPS, Take by mouth., Disp: , Rfl:    hydrochlorothiazide (HYDRODIURIL) 25 MG tablet, Take 1 tablet (25 mg total) by mouth daily. Prn swelling, Disp: 90 tablet, Rfl: 0   ibuprofen (ADVIL) 200 MG tablet, Take 200 mg by mouth every 6 (six) hours as needed for mild pain (pain score 1-3)., Disp: , Rfl:    Multiple Vitamin (MULTIVITAMIN PO), Take by mouth., Disp: , Rfl:    NIFEdipine (PROCARDIA-XL/NIFEDICAL-XL) 30 MG 24 hr tablet, TAKE (1) TABLET BY MOUTH ONCE DAILY., Disp: 90 tablet, Rfl: 0   Omega-3 1000 MG  CAPS, Take 1,000 mg by mouth daily., Disp: , Rfl:    OVER THE COUNTER MEDICATION, Take 15 mLs by mouth at bedtime. Rejuvicare collagen liquid, Disp: , Rfl:    Probiotic Product (ALIGN) 4 MG CAPS, Take 4 mg by mouth daily., Disp: , Rfl:    progesterone (PROMETRIUM) 100 MG capsule, Take 200 mg by mouth See admin instructions. Take 200 mg 6 days a week skipping Sunday dose, Disp: , Rfl:    UNABLE TO FIND, Med Name: Seamoss, Disp: , Rfl:    VITAMIN D PO, Take 1 capsule by mouth daily., Disp: , Rfl:    Allergies  Allergen Reactions   Amlodipine Other (See Comments)    Causes swelling   Amoxicillin Diarrhea   Hydrocodone Itching   Lisinopril     Headache and dizziness   Metronidazole Hives   Norvasc [Amlodipine Besylate]     Unknown reaction    Plaquenil  [Hydroxychloroquine Sulfate] Hives and Itching     Review of Systems  Constitutional: Negative.   Eyes:  Negative for blurred vision.  Respiratory:  Positive for shortness of breath.   Cardiovascular: Negative.  Negative for chest pain and palpitations.  Gastrointestinal: Negative.   Neurological: Negative.   Psychiatric/Behavioral: Negative.       Today's Vitals   05/07/23 1119  BP: 118/78  Pulse: 76  Temp: 98.1 F (36.7 C)  SpO2: 98%  Weight: 191 lb 9.6 oz (86.9 kg)  Height: 5\' 5"  (1.651 m)   Body mass index is 31.88 kg/m.  Wt Readings from Last 3 Encounters:  05/07/23 191 lb 9.6 oz (86.9 kg)  04/18/23 191 lb 4 oz (86.8 kg)  02/27/23 192 lb 9.6 oz (87.4 kg)     Objective:  Physical Exam Vitals and nursing note reviewed.  Constitutional:      Appearance: Normal appearance.  HENT:     Head: Normocephalic and atraumatic.  Eyes:     Extraocular Movements: Extraocular movements intact.  Cardiovascular:     Rate and Rhythm: Normal rate and regular rhythm.     Heart sounds: Normal heart sounds.  Pulmonary:     Effort: Pulmonary effort is normal.     Breath sounds: Normal breath sounds.  Musculoskeletal:     Cervical back: Normal range of motion.  Skin:    General: Skin is warm.  Neurological:     General: No focal deficit present.     Mental Status: She is alert.  Psychiatric:        Mood and Affect: Mood normal.        Behavior: Behavior normal.         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 Advised to decrease Minivelle usage to once patch weekly.  She agrees to rto in 4 months for re-evaluation.    Dyspnea on exertion Assessment & Plan: Persistent. She has had Pulmonary evaluation as well as Coronary CT. Cardiac calcium scoring is zero, but enlarged PA noted. Echo  results reviewed. She will speak with PCP regarding Cardiology evaluation since she is having persistent sx. Pt advised that some of this could be due to deconditioning since she has not been exercising regularly.    Class 1 obesity due to excess calories with serious comorbidity and body mass index (BMI) of 31.0 to 31.9 in adult  She is encouraged to strive for  BMI less than 30 to decrease cardiac risk. Advised to aim for at least 150 minutes of exercise per week.    Return for 4 MONTH BHRT F/U. Marland Kitchen  Patient was given opportunity to ask questions. Patient verbalized understanding of the plan and was able to repeat key elements of the plan. All questions were answered to their satisfaction.    I, Anita Aliment, MD, have reviewed all documentation for this visit. The documentation on 05/07/23 for the exam, diagnosis, procedures, and orders are all accurate and complete.   IF YOU HAVE BEEN REFERRED TO A SPECIALIST, IT MAY TAKE 1-2 WEEKS TO SCHEDULE/PROCESS THE REFERRAL. IF YOU HAVE NOT HEARD FROM US/SPECIALIST IN TWO WEEKS, PLEASE GIVE [place] A CALL AT [phone].   THE PATIENT IS ENCOURAGED TO PRACTICE SOCIAL DISTANCING DUE TO THE COVID-19 PANDEMIC.

## 2023-05-07 NOTE — Patient Instructions (Signed)
Shortness of Breath, Adult Shortness of breath means you have trouble breathing. Shortness of breath could be a sign of a medical problem. Follow these instructions at home:  Pollution Do not smoke or use any products that contain nicotine or tobacco. If you need help quitting, ask your doctor. Avoid things that can make it harder to breathe, such as: Smoke of all kinds. This includes smoke from campfires or forest fires. Do not smoke or allow others to smoke in your home. Mold. Dust. Air pollution. Chemical smells. Things that can give you an allergic reaction (allergens) if you have allergies. Keep your living space clean. Use products that help remove mold and dust. General instructions Watch for any changes in your symptoms. Take over-the-counter and prescription medicines only as told by your doctor. This includes oxygen therapy and inhaled medicines. Rest as needed. Return to your normal activities when your doctor says that it is safe. Keep all follow-up visits. Contact a doctor if: Your condition does not get better as soon as expected. You have a hard time doing your normal activities, even after you rest. You have new symptoms. You cannot walk up stairs. You cannot exercise the way you normally do. Get help right away if: Your shortness of breath gets worse. You have trouble breathing when you are resting. You feel light-headed or you faint. You have a cough that is not helped by medicines. You cough up blood. You have pain with breathing. You have pain in your chest, arms, shoulders, or belly (abdomen). You have a fever. These symptoms may be an emergency. Get help right away. Call 911. Do not wait to see if the symptoms will go away. Do not drive yourself to the hospital. Summary Shortness of breath is when you have trouble breathing enough air. It can be a sign of a medical problem. Avoid things that make it hard for you to breathe, such as smoking, pollution,  mold, and dust. Watch for any changes in your symptoms. Contact your doctor if you do not get better or you get worse. This information is not intended to replace advice given to you by your health care provider. Make sure you discuss any questions you have with your health care provider. Document Revised: 01/22/2021 Document Reviewed: 01/22/2021 Elsevier Patient Education  2024 [employer].

## 2023-05-12 ENCOUNTER — Encounter: Payer: Self-pay | Admitting: Internal Medicine

## 2023-05-12 NOTE — Assessment & Plan Note (Signed)
Chronic, currently on Minivelle patches and compounded progesterone SR capsules, 175mg  nightly and agrees to decrease dose to 150mg  nightly, except Sundays. Advised to decrease Minivelle usage to once patch weekly.  She agrees to rto in 4 months for re-evaluation.

## 2023-05-13 NOTE — Assessment & Plan Note (Signed)
Persistent. She has had Pulmonary evaluation as well as Coronary CT. Cardiac calcium scoring is zero, but enlarged PA noted. Echo results reviewed. She will speak with PCP regarding Cardiology evaluation since she is having persistent sx. Pt advised that some of this could be due to deconditioning since she has not been exercising regularly.

## 2023-05-29 ENCOUNTER — Ambulatory Visit: Payer: No Typology Code available for payment source | Admitting: Family Medicine

## 2023-05-31 ENCOUNTER — Ambulatory Visit: Payer: 59 | Admitting: Family Medicine

## 2023-05-31 VITALS — BP 120/77 | HR 81 | Temp 97.3°F | Ht 65.0 in | Wt 189.6 lb

## 2023-05-31 DIAGNOSIS — M5441 Lumbago with sciatica, right side: Secondary | ICD-10-CM

## 2023-05-31 DIAGNOSIS — M545 Low back pain, unspecified: Secondary | ICD-10-CM | POA: Insufficient documentation

## 2023-05-31 MED ORDER — PREDNISONE 10 MG PO TABS: ORAL_TABLET | ORAL | 0 refills | Status: DC

## 2023-05-31 NOTE — Patient Instructions (Signed)
Medication as directed.  Rest, Heat.  If you fail to improve or worsen, please let us know.  Dr. Adriana Simas

## 2023-05-31 NOTE — Assessment & Plan Note (Signed)
Treating with prednisone. 

## 2023-05-31 NOTE — Progress Notes (Signed)
Subjective:  Patient ID: [name], female    DOB: 29-Dec-1960  Age: 62 y.o. MRN: [id number]  CC:  Sciatica  HPI:  62 year old female presents for evaluation of the above.  Patient reports that her symptoms started on Tuesday.  She reports right-sided low back pain with radiculopathy/sciatica.  No recent fall, trauma, injury.  She has used ice, heat, IcyHot, and topical Voltaren without relief. Exacerbated by activity.  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   Anxiety and depression 09/06/2015   Chronic fatigue 09/06/2015   Prediabetes 04/30/2015   Current use of estrogen therapy 02/24/2015   Hot flashes 02/24/2015   Essential hypertension, benign 10/25/2012    Social Hx   Social History   Socioeconomic History   Marital status: Married    Spouse name: Not on file   Number of children: Not on file   Years of education: Not on file   Highest education level: Not on file  Occupational History   Not on file  Tobacco Use   Smoking status: Never    Passive exposure: Never   Smokeless tobacco: Never  Vaping Use   Vaping status: Never Used  Substance and Sexual Activity   Alcohol use: No   Drug use: No   Sexual activity: Yes    Birth control/protection: Surgical    Comment: hyst  Other Topics Concern   Not on file  Social History Narrative   Not on file   Social Drivers of Health   Financial Resource Strain: Not on file  Food Insecurity: Not on file  Transportation Needs: Not on file  Physical Activity: Not on file  Stress: Not on file  Social Connections: Not on file    Review of Systems Per HPI  Objective:   BP 120/77   Pulse 81   Temp (!) 97.3 F (36.3 C)   Ht 5\' 5"  (1.651 m)   Wt 189 lb 9.6 oz (86 kg)   SpO2 99%   BMI 31.55 kg/m      05/31/2023   10:40 AM 05/07/2023   11:19 AM 04/18/2023   10:36 AM  BP/Weight  Systolic BP 120 118 128  Diastolic BP 77 78 72  Wt. (Lbs) 189.6 191.6 191.25  BMI 31.55 kg/m2 31.88 kg/m2 31.11 kg/m2    Physical Exam Constitutional:      General: She is not in acute distress. HENT:     Head: Normocephalic and atraumatic.  Cardiovascular:     Rate and Rhythm: Normal rate and regular rhythm.  Pulmonary:     Effort: Pulmonary effort is normal. No respiratory distress.  Musculoskeletal:     Comments: Right lumbar paraspinal tenderness to palpation.  Neurological:     Mental Status: She is alert.  Psychiatric:        Mood and Affect: Mood normal.        Behavior: Behavior normal.     Lab Results  Component Value Date   WBC 7.1 12/15/2022   HGB 13.0 12/15/2022   HCT 39.1 12/15/2022   PLT 257 12/15/2022   GLUCOSE 93 03/02/2023   CHOL 220 (H) 08/28/2022  TRIG 82 08/28/2022   HDL 77 08/28/2022   LDLCALC 129 (H) 08/28/2022   ALT 16 08/28/2022   AST 16 08/28/2022   NA 139 03/02/2023   K 4.3 03/02/2023   CL 102 03/02/2023   CREATININE 0.98 03/02/2023   BUN 16 03/02/2023   CO2 24 03/02/2023   TSH 1.810 03/14/2021   HGBA1C 6.2 (H) 03/02/2023     Assessment & Plan:   Problem List Items Addressed This Visit       Other   Acute low back pain - Primary   Treating with prednisone.      Relevant Medications   predniSONE (DELTASONE) 10 MG tablet    Meds ordered this encounter  Medications   predniSONE (DELTASONE) 10 MG tablet    Sig: 50 mg daily x 2 days, then 40 mg daily x 2 days, then 30 mg daily x 2 days, then 20 mg daily x 2 days, then 10 mg daily x 2 days.    Dispense:  30 tablet    Refill:  0    Follow-up:  Return if symptoms worsen or fail to improve.  Everlene Other DO [facility]

## 2023-06-06 ENCOUNTER — Encounter: Payer: Self-pay | Admitting: Family Medicine

## 2023-06-08 NOTE — Telephone Encounter (Signed)
Nurses-please connect with patient.  With ongoing sciatica this is a indication that the nerve root is being irritated or pushed on.  Ibuprofen or Voltaren those type of medicines would not really help with the pain.  In regards to a shot for pain we do not have that capability.  We can set her up with a back specialist if she is interested and they can do a nerve root injection.  Additional alternatives would include low-dose gabapentin will help many people if they tolerate it okay-typically has to be started low-dose and gradually increase to get relief.  Also if she is having any muscle weakness this needs further evaluation ASAP plus also if she does not improve over the course of the next 1 to 2 weeks I would recommend in person reevaluation.  Please see if she would like referral if so go ahead with [facility] or Dr. Trecia Rogers in [place] with [facility] If she would like to try gabapentin and recommend 100 mg 1 taken 3 times daily caution drowsiness #90 with 1 refill with a follow-up visit within the next 2 to 3 weeks-if she if she chooses gabapentin and recommend starting off slow 1 each evening for the first 3 days then 1 twice daily for 5 days then advance to 1 3 times daily as long as it does not cause drowsiness-she will need to keep [place] posted-thank you

## 2023-06-14 ENCOUNTER — Ambulatory Visit: Payer: 59 | Admitting: Family Medicine

## 2023-06-14 VITALS — BP 122/79 | Temp 97.2°F | Wt 190.0 lb

## 2023-06-14 DIAGNOSIS — B349 Viral infection, unspecified: Secondary | ICD-10-CM

## 2023-06-14 DIAGNOSIS — R0609 Other forms of dyspnea: Secondary | ICD-10-CM | POA: Diagnosis not present

## 2023-06-14 NOTE — Progress Notes (Addendum)
° °  Subjective:    Patient ID: [name], female    DOB: Feb 10, 1961, 62 y.o.   MRN: [id number]  Discussed the use of AI scribe software for clinical note transcription with the patient, who gave verbal consent to proceed.  History of Present Illness   The patient also reports a recent illness characterized by a sore throat and chest congestion. She describes a cough that has not been productive despite the use of over-the-counter medications such as Robitussin DM. The patient denies fever but notes increased fatigue. She has not been in contact with sick family members and tested negative for COVID-19.  This dietary change is motivated by a borderline A1C result of 6.2, indicating prediabetes. The patient acknowledges a weakness for chocolate and is aware of the need for dietary modification.         Review of Systems     Objective:    Physical Exam   CHEST: Lungs clear to auscultation, no evidence of pneumonia.      Gen-NAD not toxic TMS-normal bilateral T- normal no redness Chest-CTA respiratory rate normal no crackles CV RRR no murmur Skin-warm dry Neuro-grossly normal      Assessment & Plan:  Assessment and Plan    Dyspnea Patient reports shortness of breath with exertion and at rest. Unclear etiology. Possible anxiety component. Patient has been referred to a pulmonologist for further evaluation. -Schedule appointment with pulmonologist at [facility] in [place].  Upper Respiratory Infection Patient reports recent onset of cough and chest congestion. No signs of pneumonia on examination. Likely viral etiology. -Advise patient to rest and monitor symptoms. If symptoms worsen or patient develops high fever, shaking, chills, or increased coughing, patient should contact the office for reevaluation.     General Health Maintenance -Advise patient to schedule mammogram. Last mammogram was in December of the previous year. -Encourage patient to continue with plans for  dietary modification to manage prediabetes.     Currently I would not recommend antibiotics I believe that this is more of a viral illness that will just take several days for it run its course if not doing dramatically better within the next 7 days notify us If progressive symptoms chest congestion reply fevers or worse notify us to be rechecked immediately  I doubt that her dyspnea is due to the heart her coronary calcium was 0 and also echo showed 55% ejection fraction and grade 1 diastolic dysfunction not enough to trigger her dyspnea.  It is possible that there could be stress element involved.  CAT scan did show enlargement of pulmonic trunk but uncertain of the clinical significance of this  Referral has been placed pulmonary department [facility] [address] [phone]

## 2023-06-18 MED ORDER — GABAPENTIN 100 MG PO CAPS: 100.0000 mg | ORAL_CAPSULE | Freq: Three times a day (TID) | ORAL | 1 refills | Status: DC

## 2023-06-22 ENCOUNTER — Other Ambulatory Visit (HOSPITAL_COMMUNITY): Payer: Self-pay | Admitting: Family Medicine

## 2023-06-22 DIAGNOSIS — Z1231 Encounter for screening mammogram for malignant neoplasm of breast: Secondary | ICD-10-CM

## 2023-06-27 ENCOUNTER — Ambulatory Visit (HOSPITAL_COMMUNITY)
Admission: RE | Admit: 2023-06-27 | Discharge: 2023-06-27 | Disposition: A | Discharge status: Home / Self Care | Payer: 59 | Source: Ambulatory Visit | Attending: Family Medicine | Admitting: Family Medicine

## 2023-06-27 DIAGNOSIS — Z1231 Encounter for screening mammogram for malignant neoplasm of breast: Secondary | ICD-10-CM | POA: Insufficient documentation

## 2023-06-28 ENCOUNTER — Other Ambulatory Visit (HOSPITAL_COMMUNITY): Payer: Self-pay | Admitting: Family Medicine

## 2023-06-28 DIAGNOSIS — R928 Other abnormal and inconclusive findings on diagnostic imaging of breast: Secondary | ICD-10-CM

## 2023-07-03 ENCOUNTER — Other Ambulatory Visit: Payer: Self-pay | Admitting: Family Medicine

## 2023-07-03 ENCOUNTER — Other Ambulatory Visit: Payer: Self-pay | Admitting: Nurse Practitioner

## 2023-07-03 NOTE — Telephone Encounter (Signed)
 Nurses If the gabapentin  is causing her to be drowsy she can stop the gabapentin   Anti-inflammatories can help some people but in other cases do not do much She may try Voltaren  75 mg 1 twice daily as needed for the back pain if it is helping she may continue it, #30, no refills  Also please work with the front and with the patient to place her somewhere on my schedule within the next 2 weeks-recently my schedule has been relatively full We do have a PA that she could see quite readily

## 2023-07-04 ENCOUNTER — Encounter: Payer: Self-pay | Admitting: Physician Assistant

## 2023-07-04 ENCOUNTER — Ambulatory Visit: Payer: 59 | Admitting: Physician Assistant

## 2023-07-04 VITALS — BP 120/72 | Ht 65.0 in | Wt 191.0 lb

## 2023-07-04 DIAGNOSIS — M5416 Radiculopathy, lumbar region: Secondary | ICD-10-CM | POA: Diagnosis not present

## 2023-07-04 DIAGNOSIS — M5441 Lumbago with sciatica, right side: Secondary | ICD-10-CM | POA: Diagnosis not present

## 2023-07-04 MED ORDER — DICLOFENAC SODIUM 75 MG PO TBEC: 75.0000 mg | DELAYED_RELEASE_TABLET | Freq: Two times a day (BID) | ORAL | 0 refills | Status: DC

## 2023-07-04 NOTE — Progress Notes (Signed)
 Acute Office Visit  Subjective:     Patient ID: [name], female    DOB: 07/20/1960, 63 y.o.   MRN: [id number]   HPI Patient is in today for sciatica and low back pain.  Patient was seen at our office on 05/31/2023 for similar symptoms and reports today symptoms have not improved with previous treatments.  She relates that pain is in the middle low back with sciatica to the right upper leg and left hip to buttock region.  She was previously treated with prednisone  and had no relief.  She reports gabapentin  caused extreme drowsiness.  Patient reports using Biofreeze and topical Voltaren  with some relief.  She states she has been doing some stretching at home.  Review of Systems  Constitutional:  Negative for chills and fever.  Respiratory:  Negative for cough and shortness of breath.   Cardiovascular:  Negative for chest pain and palpitations.  Musculoskeletal:  Positive for back pain. Negative for falls.  Neurological:  Negative for tingling and weakness.        Objective:     BP 120/72   Ht 5\' 5"  (1.651 m)   Wt 191 lb (86.6 kg)   BMI 31.78 kg/m   Physical Exam Vitals reviewed.  Constitutional:      General: She is not in acute distress.    Appearance: Normal appearance.  HENT:     Nose: Nose normal.     Mouth/Throat:     Mouth: Mucous membranes are moist.     Pharynx: Oropharynx is clear.  Eyes:     Extraocular Movements: Extraocular movements intact.     Conjunctiva/sclera: Conjunctivae normal.  Cardiovascular:     Rate and Rhythm: Normal rate and regular rhythm.     Heart sounds: No murmur heard.    No friction rub. No gallop.  Pulmonary:     Effort: Pulmonary effort is normal.     Breath sounds: Normal breath sounds. No stridor. No wheezing, rhonchi or rales.  Musculoskeletal:     Cervical back: No tenderness.     Thoracic back: No tenderness.     Lumbar back: Tenderness present. No bony tenderness.  Skin:    General: Skin is warm and dry.      Capillary Refill: Capillary refill takes less than 2 seconds.  Neurological:     General: No focal deficit present.     Mental Status: She is alert and oriented to person, place, and time.  Psychiatric:        Mood and Affect: Mood normal.        Behavior: Behavior normal.     No results found for any visits on 07/04/23.      Assessment & Plan:  Lumbar radiculopathy -     Diclofenac  Sodium; Take 1 tablet (75 mg total) by mouth 2 (two) times daily.  Dispense: 30 tablet; Refill: 0  Acute midline low back pain with right-sided sciatica -     Diclofenac  Sodium; Take 1 tablet (75 mg total) by mouth 2 (two) times daily.  Dispense: 30 tablet; Refill: 0   Diclofenac  BID. Warm packs and massage as tolerated. Stay active and mobile as tolerated.  Follow up if not improving in one week or worsening. Reviewed injury prevention with proper lifting techniques.  Symptoms likely related to nerve root compression. Patient appears stable at this time with no red flag symptoms. Reviewed side effects and potential for GI bleed with patient. If no relief with anti-inflammatory, would consider imaging  or referral to orthopedics for further workup.   Return if symptoms worsen or fail to improve.  Jearlean Mince Bow Buntyn, PA-C

## 2023-07-10 ENCOUNTER — Encounter (HOSPITAL_COMMUNITY): Payer: Self-pay

## 2023-07-10 ENCOUNTER — Ambulatory Visit (HOSPITAL_COMMUNITY)
Admission: RE | Admit: 2023-07-10 | Discharge: 2023-07-10 | Disposition: A | Discharge status: Home / Self Care | Payer: 59 | Source: Ambulatory Visit | Attending: Family Medicine | Admitting: Family Medicine

## 2023-07-10 DIAGNOSIS — R928 Other abnormal and inconclusive findings on diagnostic imaging of breast: Secondary | ICD-10-CM | POA: Diagnosis present

## 2023-08-07 ENCOUNTER — Encounter (HOSPITAL_COMMUNITY): Payer: 59

## 2023-08-07 ENCOUNTER — Other Ambulatory Visit (HOSPITAL_COMMUNITY): Payer: 59

## 2023-08-08 ENCOUNTER — Ambulatory Visit: Payer: 59 | Admitting: Family Medicine

## 2023-08-10 ENCOUNTER — Ambulatory Visit: Payer: 59 | Admitting: Family Medicine

## 2023-08-13 ENCOUNTER — Ambulatory Visit: Payer: 59 | Admitting: Family Medicine

## 2023-08-13 ENCOUNTER — Encounter: Payer: Self-pay | Admitting: Family Medicine

## 2023-08-13 VITALS — BP 122/74 | HR 66 | Temp 98.2°F | Ht 65.0 in | Wt 191.0 lb

## 2023-08-13 DIAGNOSIS — I10 Essential (primary) hypertension: Secondary | ICD-10-CM

## 2023-08-13 DIAGNOSIS — E785 Hyperlipidemia, unspecified: Secondary | ICD-10-CM

## 2023-08-13 DIAGNOSIS — M5441 Lumbago with sciatica, right side: Secondary | ICD-10-CM

## 2023-08-13 DIAGNOSIS — Z79899 Other long term (current) drug therapy: Secondary | ICD-10-CM | POA: Diagnosis not present

## 2023-08-13 DIAGNOSIS — M5416 Radiculopathy, lumbar region: Secondary | ICD-10-CM

## 2023-08-13 MED ORDER — NIFEDIPINE ER OSMOTIC RELEASE 30 MG PO TB24: ORAL_TABLET | ORAL | 2 refills | Status: AC

## 2023-08-13 MED ORDER — ESCITALOPRAM OXALATE 20 MG PO TABS: 20.0000 mg | ORAL_TABLET | Freq: Every day | ORAL | 1 refills | Status: DC

## 2023-08-13 MED ORDER — DICLOFENAC SODIUM 75 MG PO TBEC: 75.0000 mg | DELAYED_RELEASE_TABLET | Freq: Two times a day (BID) | ORAL | 2 refills | Status: DC

## 2023-08-13 NOTE — Patient Instructions (Signed)

## 2023-08-13 NOTE — Progress Notes (Signed)
   Subjective:    Patient ID: [name], female    DOB: [birth year], 63 y.o.   MRN: [id number]  Discussed the use of AI scribe software for clinical note transcription with the patient, who gave verbal consent to proceed.  History of Present Illness   [name] is a 63 year old female with back pain and sciatica who presents for follow-up on her symptoms and medication management.  She has experienced improvement in her back pain with medication, although discomfort persists when wearing heels, especially the following day. She continues to visit a chiropractor twice a week, which she finds beneficial, and sometimes incorporates stretches at home. She wants to return to walking regularly.  She experiences burning and discomfort down her leg, primarily on one side. This symptom is less severe than before and occurs intermittently, sometimes waking her at night. Relief is found by walking around or changing her sleeping position. Sleeping on her side with knees pulled up helps when the pain is throbbing. No weakness in her legs related to her back issues.  In August, she received an injection for her sciatica at [facility], which was not significantly helpful. She currently takes diclofenac, which she finds very helpful for her discomfort, although she is out of the medication. She takes it as needed, usually at night, and has not experienced any stomach issues. Gabapentin was previously prescribed but caused drowsiness, leading to its discontinuation.  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         Review of Systems     Objective:    Physical Exam   MUSCULOSKELETAL: Leg strength normal.     Negative straight leg raise bilateral reflexes are good lungs clear heart regular      Assessment & Plan:  Assessment and Plan    Lower Back Pain with Sciatica Improvement noted with chiropractic treatment and home exercises.  Diclofenac found to be effective for pain management. Persistent nocturnal discomfort and occasional leg pain. -Continue chiropractic treatment and home exercises. -Refill Diclofenac for use as needed. -Consider referral to specialist if no further improvement.  Hypertension Managed with Nifedipine and Hydrochlorothiazide (as needed). -Continue current medication regimen.  Hyperlipidemia Last cholesterol check showed elevated bad cholesterol. -Order lipid panel for re-evaluation.  Prediabetes Last A1c was 6.2. -Continue lifestyle modifications including diet and exercise. -Repeat A1c with lipid panel.  General Health Maintenance -Consider Shingrix vaccine for shingles prevention. -Schedule follow-up appointment in six months.     Her moods are overall doing well continue the Lexapro HCTZ is only if she has pedal edema she uses it infrequently Nifedipine XL is to manage her blood pressure under good control currently Follow-up again in approximately 6 months Has history of hyperlipidemia is not on statin check lipid profile

## 2023-08-21 ENCOUNTER — Encounter: Payer: Self-pay | Admitting: Internal Medicine

## 2023-08-21 ENCOUNTER — Ambulatory Visit: Payer: 59 | Admitting: Internal Medicine

## 2023-08-21 VITALS — BP 145/84 | HR 56 | Ht 65.0 in | Wt 191.0 lb

## 2023-08-21 DIAGNOSIS — R0602 Shortness of breath: Secondary | ICD-10-CM

## 2023-08-21 DIAGNOSIS — G4719 Other hypersomnia: Secondary | ICD-10-CM

## 2023-08-21 DIAGNOSIS — R0683 Snoring: Secondary | ICD-10-CM | POA: Diagnosis not present

## 2023-08-21 NOTE — Patient Instructions (Addendum)
 It was a pleasure to see you today!  Please schedule follow up scheduled with myself in 2 months.  If my schedule is not open yet, we will contact you with a reminder closer to that time. Please call [phone] if you haven't heard from [place] a month before, and always call us sooner if issues or concerns arise. You can also send [place] a message through MyChart, but but aware that this is not to be used for urgent issues and it may take up to 5-7 days to receive a reply. Please be aware that you will likely be able to view your results before I have a chance to respond to them. Please give [place] 5 business days to respond to any non-urgent results.    Before your next visit I would like you to have: PFTs - 1 hour Sleep study - in lab  I think we should reexamine you for sleep apnea because of your excessive daytime sleepiness and snoring.  Your hemoglobin and thyroid levels are normal.  We will get pulmonary function testing to evaluate shortness of breath.  I do not think the chest pain you are having is related to your heart or your lungs.  Regarding your enlarged pulmonary trunk on CT scan, you had an echocardiogram to look at your heart and the pulmonary artery was normal.  You do not need to worry about this.  No need to take inhalers if you do not feel like they are helping you.

## 2023-08-21 NOTE — Progress Notes (Signed)
 [name]    [id number]    Nov 07, 1960  Primary Care Physician:Luking, Jonna Coup, MD Date of Appointment: 08/21/2023 Established Patient Visit  Chief complaint:   Chief Complaint  Patient presents with   Establish Care   Shortness of Breath     HPI: [name] is a 63 y.o. woman with past medical history of asthma.   Interval Updates: Former patient of Dr. Sherene Sires, here to establish care with me today. Has questions about her enlarged pulmonary trunk from former ct scan.    Has dyspnea on exertion and fatigue for the last several months. Has dyspnea with minimal exertion. Such as walking up and down the aisles with grocery shopping.   Denies wheezing, coughing. She feels a little pain in her chest, no tightness of pressure. Usually a dull pain that comes on for a few seconds resolves spontaneously. Doesn't wake up from sleep. At rest or with exertion.  No palpitations.   She has had bronchitis in the past. Usually gets once a year, treated with antibiotics.  Never smoker Passive smoke exposure in childhood.   She has been prescribed an albuterol inhaler when she was sick with covid. She doesn't use it.   She says she sleeps ok.   She did have sleep study in 2021 - in lab and had snoring with AHI 0.4/hr. oxygen nadir 88%.   She sees Dr. Allyne Gee and is undergoing hormone replacement therapy for menopause.  She is asking if her symptoms could be related to menopause.  Epworth Sleepiness Scale  Chance of dozing off while sitting and reading? 0  1  2  3   4   2.   Chance of dozing off while watching TV?  0  1  2  3   4   3.   Chance of dozing off while Sitting, inactive in a public place?  0  1  2  3   4   4.   Chance of dozing off as a passenger in a car for an hour without a break?  0  1  2  3   4   5.   Chance of dozing off while lying down to rest in the afternoon when circumstances permit?  0  1  2  3   4   6.   Chance of dozing off while sitting and talking  to someone?  0  1  2  3   4   7.   Chance of dozing off while sitting quietly after lunch without alcohol?  0  1  2  3   4   8.   Chance of dozing off while in a car, while stopped for a few minutes in traffic? 0  1  2  3   4     Total ESS 18/24   I have reviewed the patient's family social and past medical history and updated as appropriate.   Past Medical History:  Diagnosis Date   Current use of estrogen therapy 02/24/2015   Depressed 04/21/2015   Gastroesophageal reflux disease    Hair loss 02/24/2015   Hot flashes 02/24/2015   Hypertension     Past Surgical History:  Procedure Laterality Date   CHOLECYSTECTOMY     COLONOSCOPY  07/04/2011   sessile polyp in the mid transverse colon/moderate diverticulosis in the sigmoid to descending colon segments/internal hemorrhoids   COLONOSCOPY WITH PROPOFOL N/A 02/06/2022   Procedure: COLONOSCOPY WITH PROPOFOL;  Surgeon: Marletta Lor,  Hennie Duos, DO;  Location: [facility];  Service: Endoscopy;  Laterality: N/A;  10:45am, asa 2   ESOPHAGOGASTRODUODENOSCOPY     POLYPECTOMY  02/06/2022   Procedure: POLYPECTOMY;  Surgeon: Lanelle Bal, DO;  Location: [facility];  Service: Endoscopy;;   retrograde ureteral stone extraction     Total abdominal hysterectomy with probable salpingo-oophorectomy  2006-8   HEAVY MENSES/? CANCER   TUBAL LIGATION      Family History  Problem Relation Age of Onset   Diabetes type II Mother    Coronary artery disease Mother    Diabetes type II Father    Coronary artery disease Father    Hypertension Father    Diabetes type II Sister    Kidney disease Sister    Thyroid disease Sister    Hypertension Sister    Hypertension Sister    Asthma Sister    Diabetes Brother    Hypertension Brother    Colon cancer Neg Hx    Colon polyps Neg Hx    Breast cancer Neg Hx    Ovarian cancer Neg Hx    Uterine cancer Neg Hx    Ulcerative colitis Neg Hx    Celiac disease Neg Hx    Crohn's disease Neg Hx     Social History    Occupational History   Not on file  Tobacco Use   Smoking status: Never    Passive exposure: Never   Smokeless tobacco: Never  Vaping Use   Vaping status: Never Used  Substance and Sexual Activity   Alcohol use: No   Drug use: No   Sexual activity: Yes    Birth control/protection: Surgical    Comment: hyst     Physical Exam: Blood pressure (!) 145/84, pulse (!) 56, height 5\' 5"  (1.651 m), weight 191 lb (86.6 kg), SpO2 98%.  Gen:      No acute distress ENT:  mallampati IV, no nasal polyps, mucus membranes moist Lungs:    No increased respiratory effort, symmetric chest wall excursion, clear to auscultation bilaterally, no wheezes or crackles, no reproducible chest pain CV:         Regular rate and rhythm; no murmurs, rubs, or gallops.  No pedal edema   Data Reviewed: Imaging: I have personally reviewed the ct cardiac scoring which shows enlarged PA (normal echo)  PFTs:  In office spirometry done in allergy and asthma shows significant interruptions in the expiratory curve consistent with coughing during the maneuver.  The spirometry is not interpretable.  Labs: Lab Results  Component Value Date   NA 139 03/02/2023   K 4.3 03/02/2023   CO2 24 03/02/2023   GLUCOSE 93 03/02/2023   BUN 16 03/02/2023   CREATININE 0.98 03/02/2023   CALCIUM 9.5 03/02/2023   EGFR 65 03/02/2023   GFRNONAA 64 06/29/2020   Lab Results  Component Value Date   WBC 7.1 12/15/2022   HGB 13.0 12/15/2022   HCT 39.1 12/15/2022   MCV 88 12/15/2022   PLT 257 12/15/2022    Immunization status: Immunization History  Administered Date(s) Administered   Influenza Inj Mdck Quad With Preservative 03/19/2019   Influenza, Seasonal, Injecte, Preservative Fre 03/26/2023   Influenza,inj,Quad PF,6+ Mos 04/05/2021, 04/07/2022   Influenza,inj,quad, With Preservative 04/24/2016   Influenza-Unspecified 03/19/2012, 03/20/2013, 03/19/2014, 03/24/2015, 04/03/2018, 03/11/2019, 05/06/2020   Moderna  Sars-Covid-2 Vaccination 08/30/2019, 10/01/2019   Tdap 05/03/2018    External Records Personally Reviewed: pulmonary, primary care  Assessment:  Shortness of breath Excessive daytime  sleepiness  Plan/Recommendations:  I think we should reexamine you for sleep apnea because of your excessive daytime sleepiness and snoring.  Your hemoglobin and thyroid levels are normal.  We will get pulmonary function testing to evaluate shortness of breath.  I do not think the chest pain you are having is related to your heart or your lungs.  Regarding your enlarged pulmonary trunk on CT scan, you had an echocardiogram to look at your heart and the pulmonary artery was normal.  You do not need to worry about this.  No need to take inhalers if you do not feel like they are helping you.   Return to Care: Return in about 3 months (around 11/21/2023).   Durel Salts, MD Pulmonary and Critical Care Medicine [facility] Office:[phone]

## 2023-08-24 ENCOUNTER — Other Ambulatory Visit: Payer: Self-pay | Admitting: Family Medicine

## 2023-08-30 ENCOUNTER — Other Ambulatory Visit: Payer: Self-pay | Admitting: Family Medicine

## 2023-09-04 ENCOUNTER — Encounter: Payer: Self-pay | Admitting: Internal Medicine

## 2023-09-04 ENCOUNTER — Ambulatory Visit: Payer: 59 | Admitting: Internal Medicine

## 2023-09-04 VITALS — BP 122/70 | HR 60 | Temp 98.4°F | Ht 65.0 in | Wt 192.8 lb

## 2023-09-04 DIAGNOSIS — E6609 Other obesity due to excess calories: Secondary | ICD-10-CM | POA: Diagnosis not present

## 2023-09-04 DIAGNOSIS — N951 Menopausal and female climacteric states: Secondary | ICD-10-CM | POA: Diagnosis not present

## 2023-09-04 DIAGNOSIS — E66811 Obesity, class 1: Secondary | ICD-10-CM

## 2023-09-04 DIAGNOSIS — M5431 Sciatica, right side: Secondary | ICD-10-CM | POA: Insufficient documentation

## 2023-09-04 DIAGNOSIS — Z6832 Body mass index (BMI) 32.0-32.9, adult: Secondary | ICD-10-CM

## 2023-09-04 MED ORDER — PROGESTERONE MICRONIZED POWD: Status: DC

## 2023-09-04 NOTE — Assessment & Plan Note (Signed)
 Chronic, currently on Minivelle patches and compounded progesterone SR capsules, 150mg  nightly. She is concerned about weight gain, admits she isn't exercising.  We will decrease the dose of progesterone SR to 125mg  nightly, except Sundays. She agrees to rto in 3 months for re-evaluation. I suggest we also wean off of the patches, reminded it is not recommended to stay on treatment for more than five years.  She is also advised to try to decrease Minivelle usage to once patch weekly.

## 2023-09-04 NOTE — Patient Instructions (Signed)

## 2023-09-04 NOTE — Assessment & Plan Note (Signed)
Her Bmi is acceptable for her demographics. She is encouraged to aim for at least 150 minutes of exercise/week.

## 2023-09-04 NOTE — Progress Notes (Signed)
 I,[name], CMA,acting as a Neurosurgeon for [name], MD.,have documented all relevant documentation on the behalf of [name], MD,as directed by  [name], MD while in the presence of [name], MD.  Subjective:  Patient ID: [name] , female    DOB: [birth year] , 63 y.o.   MRN: [id number]  Chief Complaint  Patient presents with   BHRT     HPI  She is here today for f/u BHRT. She is currently using estradiol patches twice weekly. She is also taking progesterone nightly. Patient has no other concerns. She feels well on her current regimen.   She is UTD with mammograms.   Hypertension This is a chronic problem. The current episode started more than 1 year ago. The problem has been gradually improving since onset. Pertinent negatives include no blurred vision, chest pain or palpitations. Risk factors for coronary artery disease include sedentary lifestyle and post-menopausal state. Past treatments include diuretics and calcium channel blockers. The current treatment provides moderate improvement.     Past Medical History:  Diagnosis Date   Current use of estrogen therapy 02/24/2015   Depressed 04/21/2015   Gastroesophageal reflux disease    Hair loss 02/24/2015   Hot flashes 02/24/2015   Hypertension      Family History  Problem Relation Age of Onset   Diabetes type II Mother    Coronary artery disease Mother    Diabetes type II Father    Coronary artery disease Father    Hypertension Father    Diabetes type II Sister    Kidney disease Sister    Thyroid disease Sister    Hypertension Sister    Hypertension Sister    Asthma Sister    Diabetes Brother    Hypertension Brother    Colon cancer Neg Hx    Colon polyps Neg Hx    Breast cancer Neg Hx    Ovarian cancer Neg Hx    Uterine cancer Neg Hx    Ulcerative colitis Neg Hx    Celiac disease Neg Hx    Crohn's disease Neg Hx      Current Outpatient Medications:    ALPRAZolam (XANAX) 0.5  MG tablet, TAKE (1/2) TO (1) TABLET TWICE DAILY AS NEEDED., Disp: 20 tablet, Rfl: 0   cetirizine (ZYRTEC) 10 MG tablet, Take 10 mg by mouth daily as needed for allergies., Disp: , Rfl:    diclofenac (VOLTAREN) 75 MG EC tablet, Take 1 tablet (75 mg total) by mouth 2 (two) times daily., Disp: 30 tablet, Rfl: 2   DOTTI 0.025 MG/24HR, PLACE 1 PATCH ONTO THE SKIN TWICE WEEKLY, Disp: 24 patch, Rfl: 1   escitalopram (LEXAPRO) 20 MG tablet, Take 1 tablet (20 mg total) by mouth daily., Disp: 90 tablet, Rfl: 1   Ginkgo Biloba 60 MG CAPS, Take by mouth., Disp: , Rfl:    hydrochlorothiazide (HYDRODIURIL) 25 MG tablet, Take 1 tablet (25 mg total) by mouth daily. Prn swelling, Disp: 90 tablet, Rfl: 0   Multiple Vitamin (MULTIVITAMIN PO), Take by mouth., Disp: , Rfl:    NIFEdipine (PROCARDIA-XL/NIFEDICAL-XL) 30 MG 24 hr tablet, TAKE (1) TABLET BY MOUTH ONCE DAILY., Disp: 90 tablet, Rfl: 2   Omega-3 1000 MG CAPS, Take 1,000 mg by mouth daily., Disp: , Rfl:    OVER THE COUNTER MEDICATION, Take 15 mLs by mouth at bedtime. Rejuvicare collagen liquid, Disp: , Rfl:    Probiotic Product (ALIGN) 4 MG CAPS, Take 4 mg by  mouth daily., Disp: , Rfl:    UNABLE TO FIND, Med Name: Seamoss, Disp: , Rfl:    VITAMIN D PO, Take 1 capsule by mouth daily., Disp: , Rfl:    Progesterone Micronized (PROGESTERONE, BULK,) POWD, She is using compounded progesterone SR 125mg  nightly, except Sundays, Disp: , Rfl:    Allergies  Allergen Reactions   Amlodipine Other (See Comments)    Causes swelling   Amoxicillin Diarrhea   Hydrocodone Itching   Lisinopril     Headache and dizziness   Metronidazole Hives   Norvasc [Amlodipine Besylate]     Unknown reaction    Plaquenil [Hydroxychloroquine Sulfate] Hives and Itching   Gabapentin     drowsy     Review of Systems  Constitutional: Negative.   Eyes:  Negative for blurred vision.  Respiratory: Negative.    Cardiovascular: Negative.  Negative for chest pain and palpitations.   Gastrointestinal: Negative.   Neurological: Negative.   Psychiatric/Behavioral: Negative.    All other systems reviewed and are negative.    Today's Vitals   09/04/23 1405  BP: 122/70  Pulse: 60  Temp: 98.4 F (36.9 C)  SpO2: 98%  Weight: 192 lb 12.8 oz (87.5 kg)  Height: 5\' 5"  (1.651 m)   Body mass index is 32.08 kg/m.  Wt Readings from Last 3 Encounters:  09/04/23 192 lb 12.8 oz (87.5 kg)  08/21/23 191 lb (86.6 kg)  08/13/23 191 lb (86.6 kg)     Objective:  Physical Exam Vitals and nursing note reviewed.  Constitutional:      Appearance: Normal appearance.  HENT:     Head: Normocephalic and atraumatic.  Eyes:     Extraocular Movements: Extraocular movements intact.  Cardiovascular:     Rate and Rhythm: Normal rate and regular rhythm.     Heart sounds: Normal heart sounds.  Pulmonary:     Effort: Pulmonary effort is normal.     Breath sounds: Normal breath sounds.  Musculoskeletal:        General: Tenderness present.     Cervical back: Normal range of motion.  Skin:    General: Skin is warm.  Neurological:     General: No focal deficit present.     Mental Status: She is alert.  Psychiatric:        Mood and Affect: Mood normal.        Behavior: Behavior normal.         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 She is concerned about weight gain, admits she isn't exercising.  We will decrease the dose of progesterone SR to 125mg  nightly, except Sundays. She agrees to rto in 3 months for re-evaluation. I suggest we also wean off of the patches, reminded it is not recommended to stay on treatment for more than five years.  She is also advised to try to decrease Minivelle usage to once patch weekly.     Sciatica of right side Assessment & Plan: She is currently followed by chiro. She was given several stretching exercises for her to  perform daily. She has significant hip tightness, her back pain will likely improve as she becomes more flexible.    Class 1 obesity due to excess calories with serious comorbidity and body mass index (BMI) of 32.0 to 32.9 in adult Assessment & Plan: Her Bmi is acceptable for her demographics. She is encouraged to aim for at least 150  minutes of exercise/week.    Other orders -     Progesterone Micronized; She is using compounded progesterone SR 125mg  nightly, except Sundays  She is encouraged to strive for BMI less than 30 to decrease cardiac risk. Advised to aim for at least 150 minutes of exercise per week.    Return in 3 months (on 12/05/2023), or BHRT f/u.  Patient was given opportunity to ask questions. Patient verbalized understanding of the plan and was able to repeat key elements of the plan. All questions were answered to their satisfaction.    I, [name], MD, have reviewed all documentation for this visit. The documentation on 09/04/23 for the exam, diagnosis, procedures, and orders are all accurate and complete.    IF YOU HAVE BEEN REFERRED TO A SPECIALIST, IT MAY TAKE 1-2 WEEKS TO SCHEDULE/PROCESS THE REFERRAL. IF YOU HAVE NOT HEARD FROM US/SPECIALIST IN TWO WEEKS, PLEASE GIVE [place] A CALL AT [phone].   THE PATIENT IS ENCOURAGED TO PRACTICE SOCIAL DISTANCING DUE TO THE COVID-19 PANDEMIC.

## 2023-09-04 NOTE — Assessment & Plan Note (Signed)
 She is currently followed by chiro. She was given several stretching exercises for her to perform daily. She has significant hip tightness, her back pain will likely improve as she becomes more flexible.

## 2023-09-15 ENCOUNTER — Other Ambulatory Visit: Payer: Self-pay | Admitting: Internal Medicine

## 2023-10-04 ENCOUNTER — Ambulatory Visit
Admission: EM | Admit: 2023-10-04 | Discharge: 2023-10-04 | Disposition: A | Discharge status: Home / Self Care | Attending: Nurse Practitioner | Admitting: Nurse Practitioner

## 2023-10-04 DIAGNOSIS — J069 Acute upper respiratory infection, unspecified: Secondary | ICD-10-CM | POA: Diagnosis not present

## 2023-10-04 LAB — POC COVID19/FLU A&B COMBO
Covid Antigen, POC: NEGATIVE
Influenza A Antigen, POC: NEGATIVE
Influenza B Antigen, POC: NEGATIVE

## 2023-10-04 MED ORDER — PROMETHAZINE-DM 6.25-15 MG/5ML PO SYRP: 5.0000 mL | ORAL_SOLUTION | Freq: Four times a day (QID) | ORAL | 0 refills | Status: DC | PRN

## 2023-10-04 NOTE — ED Provider Notes (Signed)
 [facility]    CSN: [id number] Arrival date & time: 10/04/23  1145      History   Chief Complaint Chief Complaint  Patient presents with   Cough   Fatigue   Nasal Congestion    HPI [name] is a 63 y.o. female.   The history is provided by the patient.   Patient presents with a 3-day history of cough, nasal congestion, bilateral ear pressure, sneezing, and fatigue.  Patient denies fever, chills, headache, sore throat, wheezing, difficulty breathing, chest pain, abdominal pain, nausea, vomiting, diarrhea, or rash.  Patient denies any obvious known sick contacts.  Patient reports underlying history of seasonal allergies.  Reports she has been taking Zyrtec and Flonase since symptoms started.  She also has been taking over-the-counter cough and cold medications for her symptoms.    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   Hypertension 09/17/2015   Anxiety and depression 09/06/2015   Chronic fatigue 09/06/2015   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   Borderline diabetes mellitus 04/30/2015   Current use of estrogen therapy 02/24/2015   Hot flashes 02/24/2015   Hot flashes due to menopause 02/24/2015   Carpal tunnel syndrome 02/24/2013   Essential hypertension, benign 10/25/2012    Past Surgical History:  Procedure Laterality  Date   CHOLECYSTECTOMY     COLONOSCOPY  07/04/2011   sessile polyp in the mid transverse colon/moderate diverticulosis in the sigmoid to descending colon segments/internal hemorrhoids   COLONOSCOPY WITH PROPOFOL N/A 02/06/2022   Procedure: COLONOSCOPY WITH PROPOFOL;  Surgeon: Lanelle Bal, DO;  Location: [facility];  Service: Endoscopy;  Laterality: N/A;  10:45am, asa 2   ESOPHAGOGASTRODUODENOSCOPY     POLYPECTOMY  02/06/2022   Procedure: POLYPECTOMY;  Surgeon: Lanelle Bal, DO;  Location: [facility];  Service: Endoscopy;;   retrograde ureteral stone extraction     Total abdominal hysterectomy with probable salpingo-oophorectomy  2006-8   HEAVY MENSES/? CANCER   TUBAL LIGATION      OB History     Gravida  2   Para  2   Term      Preterm      AB      Living  2      SAB      IAB      Ectopic      Multiple      Live Births               Home Medications    Prior to Admission medications   Medication Sig Start Date End Date Taking? Authorizing Provider  ALPRAZolam (XANAX) 0.5 MG tablet TAKE (1/2) TO (1) TABLET TWICE DAILY AS NEEDED. 08/30/23  Yes Luking, Jonna Coup, MD  cetirizine (ZYRTEC) 10 MG tablet Take 10 mg by mouth daily as needed for  allergies.   Yes [provider]  diclofenac (VOLTAREN) 75 MG EC tablet Take 1 tablet (75 mg total) by mouth 2 (two) times daily. 08/13/23  Yes Babs Sciara, MD  DOTTI 0.025 MG/24HR PLACE 1 PATCH ONTO THE SKIN TWICE WEEKLY 09/17/23  Yes Dorothyann Peng, MD  escitalopram (LEXAPRO) 20 MG tablet Take 1 tablet (20 mg total) by mouth daily. 08/13/23  Yes Babs Sciara, MD  Ginkgo Biloba 60 MG CAPS Take by mouth.   Yes [provider]  Multiple Vitamin (MULTIVITAMIN PO) Take by mouth.   Yes [provider]  NIFEdipine (PROCARDIA-XL/NIFEDICAL-XL) 30 MG 24 hr tablet TAKE (1) TABLET BY MOUTH ONCE DAILY. 08/13/23  Yes Luking, Jonna Coup, MD  Omega-3 1000 MG CAPS Take 1,000 mg by mouth daily.   Yes  [provider]  OVER THE COUNTER MEDICATION Take 15 mLs by mouth at bedtime. Rejuvicare collagen liquid   Yes [provider]  Probiotic Product (ALIGN) 4 MG CAPS Take 4 mg by mouth daily.   Yes [provider]  Progesterone Micronized (PROGESTERONE, BULK,) POWD She is using compounded progesterone SR 125mg  nightly, except Sundays 09/04/23  Yes Dorothyann Peng, MD  UNABLE TO FIND Med Name: [facility]   Yes [provider]  VITAMIN D PO Take 1 capsule by mouth daily.   Yes [provider]  hydrochlorothiazide (HYDRODIURIL) 25 MG tablet Take 1 tablet (25 mg total) by mouth daily. Prn swelling 04/07/22   Campbell Riches, NP    Family History Family History  Problem Relation Age of Onset   Diabetes type II Mother    Coronary artery disease Mother    Diabetes type II Father    Coronary artery disease Father    Hypertension Father    Diabetes type II Sister    Kidney disease Sister    Thyroid disease Sister    Hypertension Sister    Hypertension Sister    Asthma Sister    Diabetes Brother    Hypertension Brother    Colon cancer Neg Hx    Colon polyps Neg Hx    Breast cancer Neg Hx    Ovarian cancer Neg Hx    Uterine cancer Neg Hx    Ulcerative colitis Neg Hx    Celiac disease Neg Hx    Crohn's disease Neg Hx     Social History Social History   Tobacco Use   Smoking status: Never    Passive exposure: Never   Smokeless tobacco: Never  Vaping Use   Vaping status: Never Used  Substance Use Topics   Alcohol use: No   Drug use: No     Allergies   Amlodipine, Amoxicillin, Hydrocodone, Lisinopril, Metronidazole, Norvasc [amlodipine besylate], Plaquenil [hydroxychloroquine sulfate], and Gabapentin   Review of Systems Review of Systems Per HPI  Physical Exam Triage Vital Signs ED Triage Vitals [10/04/23 1157]  Encounter Vitals Group     BP (!) 144/74     Systolic BP Percentile      Diastolic BP Percentile      Pulse Rate 95      Resp 16     Temp 98.4 F (36.9 C)     Temp Source Oral     SpO2 95 %     Weight      Height      Head Circumference      Peak Flow      Pain Score 0     Pain Loc  Pain Education      Exclude from Growth Chart    No data found.  Updated Vital Signs BP (!) 144/74 (BP Location: Left Arm)   Pulse 95   Temp 98.4 F (36.9 C) (Oral)   Resp 16   SpO2 95%   Visual Acuity Right Eye Distance:   Left Eye Distance:   Bilateral Distance:    Right Eye Near:   Left Eye Near:    Bilateral Near:     Physical Exam Vitals and nursing note reviewed.  Constitutional:      General: She is not in acute distress.    Appearance: Normal appearance.  HENT:     Head: Normocephalic.     Right Ear: Tympanic membrane, ear canal and external ear normal.     Left Ear: Tympanic membrane, ear canal and external ear normal.     Nose: Congestion present.     Mouth/Throat:     Mouth: Mucous membranes are moist.  Eyes:     Extraocular Movements: Extraocular movements intact.     Conjunctiva/sclera: Conjunctivae normal.     Pupils: Pupils are equal, round, and reactive to light.  Cardiovascular:     Rate and Rhythm: Normal rate and regular rhythm.     Pulses: Normal pulses.     Heart sounds: Normal heart sounds.  Pulmonary:     Effort: Pulmonary effort is normal. No respiratory distress.     Breath sounds: Normal breath sounds. No stridor. No wheezing, rhonchi or rales.  Abdominal:     General: Bowel sounds are normal.     Palpations: Abdomen is soft.     Tenderness: There is no abdominal tenderness.  Musculoskeletal:     Cervical back: Normal range of motion.  Skin:    General: Skin is warm and dry.  Neurological:     General: No focal deficit present.     Mental Status: She is alert and oriented to person, place, and time.  Psychiatric:        Mood and Affect: Mood normal.        Behavior: Behavior normal.      UC Treatments / Results  Labs (all labs ordered are listed,  but only abnormal results are displayed) Labs Reviewed  POC COVID19/FLU A&B COMBO    EKG   Radiology No results found.  Procedures Procedures (including critical care time)  Medications Ordered in UC Medications - No data to display  Initial Impression / Assessment and Plan / UC Course  I have reviewed the triage vital signs and the nursing notes.  Pertinent labs & imaging results that were available during my care of the patient were reviewed by me and considered in my medical decision making (see chart for details).  COVID/flu test was negative.  On exam, lung sounds are clear throughout, room air sats at 95%.  Patient is afebrile, and is well-appearing, symptoms consistent with a viral URI with cough.  Will provide symptomatic treatment with Promethazine DM for the cough at nighttime.  Supportive care recommendations were provided and discussed with patient to include continuing current allergy medications, recommend over-the-counter cough medication such as Coricidin or Delsym, fluids, rest, normal saline nasal spray, use of a humidifier during sleep.  Discussed indications with patient regarding follow-up.  Patient was in agreement with this plan of care and verbalized understanding.  All questions were answered.  Patient stable for discharge.  Final Clinical Impressions(s) / UC Diagnoses   Final diagnoses:  None  Discharge Instructions   None    ED Prescriptions   None    PDMP not reviewed this encounter.   Hardy Lia, NP 10/04/23 1220

## 2023-10-04 NOTE — ED Triage Notes (Signed)
 Cough, congestion and fatigue started 3 days ago.  Patient has been taking OTC medication to help with cough. No N/V/D to report.

## 2023-10-04 NOTE — Discharge Instructions (Addendum)
 The COVID/flu test was negative. Take medication as prescribed.  Continue your Flonase and Zyrtec daily as prescribed. Recommend over-the-counter Coricidin HBP or Delsym for your cough during the daytime. May take over-the-counter Tylenol or ibuprofen as needed for pain, fever, or general discomfort. Recommend the use of normal saline nasal spray throughout the day for nasal congestion and runny nose. For your cough, recommend the use of a humidifier at nighttime during sleep and sleeping elevated on pillows while symptoms persist. Increase fluids and allow for plenty of rest. If you develop a fever, you will need to remain home until you have been fever free for 24 hours with no medication. If symptoms do not improve over the next 5 to 7 days, or appear to be worsening, you may follow-up in this clinic or with your primary care physician for further evaluation. Follow-up as needed.

## 2023-10-05 ENCOUNTER — Ambulatory Visit: Admitting: Family Medicine

## 2023-10-10 ENCOUNTER — Encounter: Payer: Self-pay | Admitting: Family Medicine

## 2023-10-12 ENCOUNTER — Encounter: Payer: Self-pay | Admitting: Family Medicine

## 2023-10-12 LAB — BASIC METABOLIC PANEL WITH GFR
BUN/Creatinine Ratio: 15 (ref 12–28)
BUN: 16 mg/dL (ref 8–27)
CO2: 25 mmol/L (ref 20–29)
Calcium: 9.1 mg/dL (ref 8.7–10.3)
Chloride: 104 mmol/L (ref 96–106)
Creatinine, Ser: 1.05 mg/dL — ABNORMAL HIGH (ref 0.57–1.00)
Glucose: 98 mg/dL (ref 70–99)
Potassium: 4.3 mmol/L (ref 3.5–5.2)
Sodium: 141 mmol/L (ref 134–144)
eGFR: 60 mL/min/{1.73_m2} (ref >59)

## 2023-10-12 LAB — HEPATIC FUNCTION PANEL
ALT: 24 IU/L (ref 0–32)
AST: 20 IU/L (ref 0–40)
Albumin: 4 g/dL (ref 3.9–4.9)
Alkaline Phosphatase: 108 IU/L (ref 44–121)
Bilirubin Total: 0.5 mg/dL (ref 0.0–1.2)
Bilirubin, Direct: 0.14 mg/dL (ref 0.00–0.40)
Total Protein: 6.4 g/dL (ref 6.0–8.5)

## 2023-10-12 LAB — LIPID PANEL
Chol/HDL Ratio: 3.4 ratio (ref 0.0–4.4)
Cholesterol, Total: 225 mg/dL — ABNORMAL HIGH (ref 100–199)
HDL: 66 mg/dL (ref >39)
LDL Chol Calc (NIH): 144 mg/dL — ABNORMAL HIGH (ref 0–99)
Triglycerides: 85 mg/dL (ref 0–149)
VLDL Cholesterol Cal: 15 mg/dL (ref 5–40)

## 2023-10-15 ENCOUNTER — Other Ambulatory Visit: Payer: Self-pay

## 2023-10-15 DIAGNOSIS — E785 Hyperlipidemia, unspecified: Secondary | ICD-10-CM

## 2023-10-15 DIAGNOSIS — Z79899 Other long term (current) drug therapy: Secondary | ICD-10-CM

## 2023-10-15 DIAGNOSIS — R7303 Prediabetes: Secondary | ICD-10-CM

## 2023-10-15 DIAGNOSIS — I10 Essential (primary) hypertension: Secondary | ICD-10-CM

## 2023-10-15 NOTE — Telephone Encounter (Signed)
 Nurses See previous MyChart message Please order lipid, lipoprotein a, A1c, metabolic 7, liver Hyperlipidemia, high risk medication, prediabetes  Patient can do this lab work in the fall time and do a follow-up office visit preferably no later than October  Please send her a MyChart message letting her know labs were ordered to be completed before and at the visit I would encourage the patient to go ahead and set up the visit at this time thank you

## 2023-10-22 ENCOUNTER — Encounter (HOSPITAL_BASED_OUTPATIENT_CLINIC_OR_DEPARTMENT_OTHER): Admitting: Pulmonary Disease

## 2023-11-20 ENCOUNTER — Ambulatory Visit: Payer: 59 | Admitting: Family Medicine

## 2023-11-21 ENCOUNTER — Ambulatory Visit: Admitting: Internal Medicine

## 2023-11-21 ENCOUNTER — Encounter: Payer: Self-pay | Admitting: Internal Medicine

## 2023-11-21 VITALS — BP 126/77 | HR 70 | Ht 65.0 in | Wt 190.0 lb

## 2023-11-21 DIAGNOSIS — G4719 Other hypersomnia: Secondary | ICD-10-CM | POA: Diagnosis not present

## 2023-11-21 DIAGNOSIS — J301 Allergic rhinitis due to pollen: Secondary | ICD-10-CM

## 2023-11-21 DIAGNOSIS — R0609 Other forms of dyspnea: Secondary | ICD-10-CM

## 2023-11-21 DIAGNOSIS — R0602 Shortness of breath: Secondary | ICD-10-CM | POA: Diagnosis not present

## 2023-11-21 LAB — PULMONARY FUNCTION TEST
DL/VA % pred: 111 %
DL/VA: 4.63 ml/min/mmHg/L
DLCO unc % pred: 70 %
DLCO unc: 14.73 ml/min/mmHg
FEF 25-75 Post: 2.35 L/s
FEF 25-75 Pre: 1.84 L/s
FEF2575-%Change-Post: 27 %
FEF2575-%Pred-Post: 101 %
FEF2575-%Pred-Pre: 79 %
FEV1-%Change-Post: 5 %
FEV1-%Pred-Post: 70 %
FEV1-%Pred-Pre: 66 %
FEV1-Post: 1.83 L
FEV1-Pre: 1.73 L
FEV1FVC-%Change-Post: 0 %
FEV1FVC-%Pred-Pre: 106 %
FEV6-%Change-Post: 4 %
FEV6-%Pred-Post: 67 %
FEV6-%Pred-Pre: 64 %
FEV6-Post: 2.19 L
FEV6-Pre: 2.09 L
FEV6FVC-%Pred-Post: 103 %
FEV6FVC-%Pred-Pre: 103 %
FVC-%Change-Post: 4 %
FVC-%Pred-Post: 65 %
FVC-%Pred-Pre: 61 %
FVC-Post: 2.19 L
FVC-Pre: 2.09 L
Post FEV1/FVC ratio: 84 %
Post FEV6/FVC ratio: 100 %
Pre FEV1/FVC ratio: 83 %
Pre FEV6/FVC Ratio: 100 %
RV % pred: 36 %
RV: 0.76 L
TLC % pred: 57 %
TLC: 3 L

## 2023-11-21 NOTE — Progress Notes (Signed)
 Full pft performed today.

## 2023-11-21 NOTE — Patient Instructions (Signed)
 Full pft performed today.

## 2023-11-21 NOTE — Progress Notes (Signed)
 [name]    [id number]    01-05-1961  Primary Care Physician:Luking, Anita Marvel, MD Date of Appointment: 11/21/2023 Established Patient Visit  Chief complaint:   Chief Complaint  Patient presents with   Follow-up    PFT     HPI: [name] is a 63 y.o. woman with past medical history of asthma and worsening dyspnea with exertion and fatigue.    Interval Updates: Here for follow up after PFTs. I repeated a sleep study but she has not had this scheduled yet.   PFTs done today show no airflow limitation, no significant response to bronchodilator. Moderate restriction to ventilation, was unable to do dlco maneuver.    She went to urgent care for cough and congestion and was given cough syrup. She has resumed her nasal spray and anti-histamine  She feels that she is always tired. Insurance didn't cover the sleep study.  Not on inhalers since they did not help.   She has gained weight over the last few months. About 10 lbs. Exercise limited with sciatic nerve issues. She has done physical therapy and gone to a chiropractor. The sciatica sometimes wakes her up at night.   She has had bronchitis in the past. Usually gets once a year, treated with antibiotics.  Never smoker Passive smoke exposure in childhood.   She did have sleep study in 2021 - in lab and had snoring with AHI 0.4/hr. oxygen nadir 88%.   She sees Dr. Elnita Hai and is undergoing hormone replacement therapy for menopause.  She is asking if her symptoms could be related to menopause.  Epworth Sleepiness Scale  Chance of dozing off while sitting and reading? 0  1  2  3   4   2.   Chance of dozing off while watching TV?  0  1  2  3   4   3.   Chance of dozing off while Sitting, inactive in a public place?  0  1  2  3   4   4.   Chance of dozing off as a passenger in a car for an hour without a break?  0  1  2  3   4   5.   Chance of dozing off while lying down to rest in the afternoon when  circumstances permit?  0  1  2  3   4   6.   Chance of dozing off while sitting and talking to someone?  0  1  2  3   4   7.   Chance of dozing off while sitting quietly after lunch without alcohol?  0  1  2  3   4   8.   Chance of dozing off while in a car, while stopped for a few minutes in traffic? 0  1  2  3   4     Total ESS 18/24   I have reviewed the patient's family social and past medical history and updated as appropriate.   Past Medical History:  Diagnosis Date   Current use of estrogen therapy 02/24/2015   Depressed 04/21/2015   Gastroesophageal reflux disease    Hair loss 02/24/2015   Hot flashes 02/24/2015   Hypertension     Past Surgical History:  Procedure Laterality Date   CHOLECYSTECTOMY     COLONOSCOPY  07/04/2011   sessile polyp in the mid transverse colon/moderate diverticulosis in the sigmoid to descending colon segments/internal hemorrhoids   COLONOSCOPY  WITH PROPOFOL  N/A 02/06/2022   Procedure: COLONOSCOPY WITH PROPOFOL ;  Surgeon: Anita Greening, DO;  Location: [facility];  Service: Endoscopy;  Laterality: N/A;  10:45am, asa 2   ESOPHAGOGASTRODUODENOSCOPY     POLYPECTOMY  02/06/2022   Procedure: POLYPECTOMY;  Surgeon: Anita Greening, DO;  Location: [facility];  Service: Endoscopy;;   retrograde ureteral stone extraction     Total abdominal hysterectomy with probable salpingo-oophorectomy  2006-8   HEAVY MENSES/? CANCER   TUBAL LIGATION      Family History  Problem Relation Age of Onset   Diabetes type II Mother    Coronary artery disease Mother    Diabetes type II Father    Coronary artery disease Father    Hypertension Father    Diabetes type II Sister    Kidney disease Sister    Thyroid  disease Sister    Hypertension Sister    Hypertension Sister    Asthma Sister    Diabetes Brother    Hypertension Brother    Colon cancer Neg Hx    Colon polyps Neg Hx    Breast cancer Neg Hx    Ovarian cancer Neg Hx    Uterine cancer Neg Hx     Ulcerative colitis Neg Hx    Celiac disease Neg Hx    Crohn's disease Neg Hx     Social History   Occupational History   Not on file  Tobacco Use   Smoking status: Never    Passive exposure: Never   Smokeless tobacco: Never  Vaping Use   Vaping status: Never Used  Substance and Sexual Activity   Alcohol use: No   Drug use: No   Sexual activity: Yes    Birth control/protection: Surgical    Comment: hyst     Physical Exam: Blood pressure 126/77, pulse 70, height 5\' 5"  (1.651 m), weight 190 lb (86.2 kg), SpO2 98%.  Gen:     No distress ENT:  mallampati IV, no nasal polyps, mucus membranes moist Lungs:    ctab no wheeze CV:        RRR no mrg   Data Reviewed: Imaging: I have personally reviewed the ct cardiac scoring which shows enlarged PA (normal echo)  PFTs:  In office spirometry done in allergy  and asthma shows significant interruptions in the expiratory curve consistent with coughing during the maneuver.  The spirometry is not interpretable.  Labs: Lab Results  Component Value Date   NA 141 10/11/2023   K 4.3 10/11/2023   CO2 25 10/11/2023   GLUCOSE 98 10/11/2023   BUN 16 10/11/2023   CREATININE 1.05 (H) 10/11/2023   CALCIUM 9.1 10/11/2023   EGFR 60 10/11/2023   GFRNONAA 64 06/29/2020   Lab Results  Component Value Date   WBC 7.1 12/15/2022   HGB 13.0 12/15/2022   HCT 39.1 12/15/2022   MCV 88 12/15/2022   PLT 257 12/15/2022    Immunization status: Immunization History  Administered Date(s) Administered   Influenza Inj Mdck Quad With Preservative 03/19/2019   Influenza, Seasonal, Injecte, Preservative Fre 03/26/2023   Influenza,inj,Quad PF,6+ Mos 04/05/2021, 04/07/2022   Influenza,inj,quad, With Preservative 04/24/2016   Influenza-Unspecified 03/19/2012, 03/20/2013, 03/19/2014, 03/24/2015, 04/03/2018, 03/11/2019, 05/06/2020   Moderna Sars-Covid-2 Vaccination 08/30/2019, 10/01/2019   Tdap 05/03/2018    External Records Personally Reviewed:  pulmonary, primary care, urgent care   Assessment:  Shortness of breath Excessive daytime sleepiness Seasonal allergic rhinitis  Plan/Recommendations:  Good news, the breathing testing is reassuring and  your spirometry is normal.  You do not need to keep taking any inhalers since they do not benefit you.  I suspect your fatigue and shortness of breath is a combination of deconditioning, and you also mentions your recent weight gain which could be contributing.  I know that your mobility is limited due to your sciatic pain.  Hopefully getting back on the physical therapy exercises will help this.  I think a regular exercise routine is the most important thing you can do for your breathing and your joint and bone health.  Continue nasal spray and antihistamine for the seasonal allergic rhinitis.  Because I am still concerned about your excessive daytime sleepiness and fatigue I think it is worthwhile to check your sleep again.  Will start with a home sleep test this time.  I will contact you with the results  Return to Care: Return in about 6 months (around 05/22/2024).   Louie Rover, MD Pulmonary and Critical Care Medicine [facility] [place] Office:[phone]

## 2023-11-21 NOTE — Patient Instructions (Addendum)
 It was a pleasure to see you today!  Please schedule follow up with myself in 6 months.  If my schedule is not open yet, we will contact you with a reminder closer to that time. Please call [phone] if you haven't heard from us  a month before, and always call us  sooner if issues or concerns arise. You can also send us  a message through MyChart, but but aware that this is not to be used for urgent issues and it may take up to 5-7 days to receive a reply. Please be aware that you will likely be able to view your results before I have a chance to respond to them. Please give us  5 business days to respond to any non-urgent results.    Before your next visit I would like you to have: Home sleep test - we will contact you to schedule this.    Good news, the breathing testing is reassuring and your spirometry is normal.  You do not need to keep taking any inhalers since they do not benefit you.  I suspect your fatigue and shortness of breath is a combination of deconditioning, and you also mentions your recent weight gain which could be contributing.  I know that your mobility is limited due to your sciatic pain.  Hopefully getting back on the physical therapy exercises will help this.  I think a regular exercise routine is the most important thing you can do for your breathing and your joint and bone health.  Continue nasal spray and antihistamine for the seasonal allergic rhinitis.  Because I am still concerned about your excessive daytime sleepiness and fatigue I think it is worthwhile to check your sleep again.  Will start with a home sleep test this time.  I will contact you with the results

## 2023-11-22 ENCOUNTER — Ambulatory Visit: Payer: Self-pay | Admitting: Family Medicine

## 2023-11-22 LAB — HEPATIC FUNCTION PANEL
ALT: 15 IU/L (ref 0–32)
AST: 17 IU/L (ref 0–40)
Albumin: 4.4 g/dL (ref 3.9–4.9)
Alkaline Phosphatase: 99 IU/L (ref 44–121)
Bilirubin Total: 0.5 mg/dL (ref 0.0–1.2)
Bilirubin, Direct: 0.13 mg/dL (ref 0.00–0.40)
Total Protein: 6.5 g/dL (ref 6.0–8.5)

## 2023-11-22 LAB — BASIC METABOLIC PANEL WITH GFR
BUN/Creatinine Ratio: 17 (ref 12–28)
BUN: 16 mg/dL (ref 8–27)
CO2: 22 mmol/L (ref 20–29)
Calcium: 9.2 mg/dL (ref 8.7–10.3)
Chloride: 106 mmol/L (ref 96–106)
Creatinine, Ser: 0.94 mg/dL (ref 0.57–1.00)
Glucose: 98 mg/dL (ref 70–99)
Potassium: 4.8 mmol/L (ref 3.5–5.2)
Sodium: 141 mmol/L (ref 134–144)
eGFR: 69 mL/min/{1.73_m2} (ref >59)

## 2023-11-22 LAB — LIPID PANEL
Chol/HDL Ratio: 3.4 ratio (ref 0.0–4.4)
Cholesterol, Total: 208 mg/dL — ABNORMAL HIGH (ref 100–199)
HDL: 61 mg/dL (ref >39)
LDL Chol Calc (NIH): 136 mg/dL — ABNORMAL HIGH (ref 0–99)
Triglycerides: 63 mg/dL (ref 0–149)
VLDL Cholesterol Cal: 11 mg/dL (ref 5–40)

## 2023-11-22 LAB — LIPOPROTEIN A (LPA): Lipoprotein (a): 218.5 nmol/L — ABNORMAL HIGH (ref <75.0)

## 2023-11-22 LAB — HEMOGLOBIN A1C
Est. average glucose Bld gHb Est-mCnc: 120 mg/dL
Hgb A1c MFr Bld: 5.8 % — ABNORMAL HIGH (ref 4.8–5.6)

## 2023-11-24 ENCOUNTER — Other Ambulatory Visit: Payer: Self-pay | Admitting: Nurse Practitioner

## 2023-11-24 MED ORDER — ROSUVASTATIN CALCIUM 5 MG PO TABS: 5.0000 mg | ORAL_TABLET | Freq: Every day | ORAL | 0 refills | Status: DC

## 2023-11-30 ENCOUNTER — Encounter: Payer: Self-pay | Admitting: Internal Medicine

## 2023-12-03 ENCOUNTER — Other Ambulatory Visit: Payer: Self-pay | Admitting: Internal Medicine

## 2023-12-03 MED ORDER — ESTRADIOL 0.0375 MG/24HR TD PTTW: 1.0000 | MEDICATED_PATCH | TRANSDERMAL | 1 refills | Status: DC

## 2023-12-05 ENCOUNTER — Other Ambulatory Visit: Payer: Self-pay | Admitting: Nurse Practitioner

## 2023-12-05 ENCOUNTER — Other Ambulatory Visit: Payer: Self-pay

## 2023-12-05 ENCOUNTER — Telehealth: Payer: Self-pay | Admitting: Family Medicine

## 2023-12-05 DIAGNOSIS — Z79899 Other long term (current) drug therapy: Secondary | ICD-10-CM

## 2023-12-05 DIAGNOSIS — R233 Spontaneous ecchymoses: Secondary | ICD-10-CM

## 2023-12-05 MED ORDER — HYDROCHLOROTHIAZIDE 25 MG PO TABS: 25.0000 mg | ORAL_TABLET | Freq: Every day | ORAL | 0 refills | Status: AC

## 2023-12-05 NOTE — Telephone Encounter (Signed)
 Refill on  hydrochlorothiazide  (HYDRODIURIL ) 25 MG tablet  [facility]

## 2023-12-07 ENCOUNTER — Ambulatory Visit: Payer: Self-pay | Admitting: Nurse Practitioner

## 2023-12-07 ENCOUNTER — Encounter

## 2023-12-07 DIAGNOSIS — G4719 Other hypersomnia: Secondary | ICD-10-CM

## 2023-12-07 LAB — CBC WITH DIFFERENTIAL/PLATELET
Basophils Absolute: 0 10*3/uL (ref 0.0–0.2)
Basos: 1 %
EOS (ABSOLUTE): 0.1 10*3/uL (ref 0.0–0.4)
Eos: 1 %
Hematocrit: 42.1 % (ref 34.0–46.6)
Hemoglobin: 13.1 g/dL (ref 11.1–15.9)
Immature Grans (Abs): 0 10*3/uL (ref 0.0–0.1)
Immature Granulocytes: 0 %
Lymphocytes Absolute: 1.8 10*3/uL (ref 0.7–3.1)
Lymphs: 29 %
MCH: 28.2 pg (ref 26.6–33.0)
MCHC: 31.1 g/dL — ABNORMAL LOW (ref 31.5–35.7)
MCV: 91 fL (ref 79–97)
Monocytes Absolute: 0.4 10*3/uL (ref 0.1–0.9)
Monocytes: 7 %
Neutrophils Absolute: 3.8 10*3/uL (ref 1.4–7.0)
Neutrophils: 62 %
Platelets: 282 10*3/uL (ref 150–450)
RBC: 4.64 x10E6/uL (ref 3.77–5.28)
RDW: 14.2 % (ref 11.7–15.4)
WBC: 6.1 10*3/uL (ref 3.4–10.8)

## 2023-12-11 ENCOUNTER — Encounter: Payer: Self-pay | Admitting: Internal Medicine

## 2023-12-11 ENCOUNTER — Ambulatory Visit: Admitting: Internal Medicine

## 2023-12-11 VITALS — BP 120/70 | HR 57 | Temp 98.2°F | Ht 65.0 in | Wt 191.0 lb

## 2023-12-11 DIAGNOSIS — Z638 Other specified problems related to primary support group: Secondary | ICD-10-CM

## 2023-12-11 DIAGNOSIS — I10 Essential (primary) hypertension: Secondary | ICD-10-CM

## 2023-12-11 DIAGNOSIS — Z6831 Body mass index (BMI) 31.0-31.9, adult: Secondary | ICD-10-CM

## 2023-12-11 DIAGNOSIS — N951 Menopausal and female climacteric states: Secondary | ICD-10-CM

## 2023-12-11 DIAGNOSIS — L6612 Frontal fibrosing alopecia: Secondary | ICD-10-CM

## 2023-12-11 DIAGNOSIS — E6609 Other obesity due to excess calories: Secondary | ICD-10-CM

## 2023-12-11 DIAGNOSIS — E66811 Obesity, class 1: Secondary | ICD-10-CM

## 2023-12-11 NOTE — Progress Notes (Signed)
 I,Jameka J Llittleton, CMA,acting as a neurosurgeon for Catheryn LOISE Slocumb, MD.,have documented all relevant documentation on the behalf of Catheryn LOISE Slocumb, MD,as directed by  Catheryn LOISE Slocumb, MD while in the presence of Catheryn LOISE Slocumb, MD.  Subjective:  Patient ID: [name] , female    DOB: [birth year] , 63 y.o.   MRN: [id number]  Chief Complaint  Patient presents with   BHRT f/u    She is here today for f/u BHRT. She is currently using estradiol  patches twice weekly. She is also taking progesterone  nightly. Patient has no other concerns. She feels well on her current regimen.      HPI Discussed the use of AI scribe software for clinical note transcription with the patient, who gave verbal consent to proceed.  History of Present Illness [name] is a 63 year old female who presents for hormone management.  She is experiencing hot flashes and concerns about skin aging. She has been using hormone patches twice a week and recently requested a higher dose due to increased symptoms. She has yet to fill the rx for higher dose patch.  Stress exacerbates her symptoms, and she has been dealing with family-related stressors.  She has a history of alopecia and previously consulted with a dermatologist. She was invited to participate in a study but did not follow through. She is considering seeking a second opinion from another dermatologist due to dissatisfaction with her current situation.  She reports a lack of physical activity recently due to a bear sighting in her neighborhood, which has deterred her from walking outdoors. She is interested in weight loss and has been considering joining a weight loss clinic.  She admits she has been under stress dealing with family issues. She has a sibling that likely has mental health issues, which has affected her children. She has over-extended herself in trying to help her familyand this has been stressful.  .     Hypertension This is a chronic  problem. The current episode started more than 1 year ago. The problem has been gradually improving since onset. Pertinent negatives include no blurred vision, chest pain or palpitations. Risk factors for coronary artery disease include sedentary lifestyle and post-menopausal state. Past treatments include diuretics and calcium  channel blockers. The current treatment provides moderate improvement.     Past Medical History:  Diagnosis Date   Current use of estrogen therapy 02/24/2015   Depressed 04/21/2015   Gastroesophageal reflux disease    Hair loss 02/24/2015   Hot flashes 02/24/2015   Hypertension      Family History  Problem Relation Age of Onset   Diabetes type II Mother    Coronary artery disease Mother    Diabetes type II Father    Coronary artery disease Father    Hypertension Father    Diabetes type II Sister    Kidney disease Sister    Thyroid  disease Sister    Hypertension Sister    Hypertension Sister    Asthma Sister    Diabetes Brother    Hypertension Brother    Colon cancer Neg Hx    Colon polyps Neg Hx    Breast cancer Neg Hx    Ovarian cancer Neg Hx    Uterine cancer Neg Hx    Ulcerative colitis Neg Hx    Celiac disease Neg Hx    Crohn's disease Neg Hx      Current Outpatient Medications:    escitalopram  (LEXAPRO ) 20 MG tablet, Take  1 tablet (20 mg total) by mouth daily., Disp: 90 tablet, Rfl: 1   estradiol  (VIVELLE -DOT) 0.0375 MG/24HR, Place 1 patch onto the skin 2 (two) times a week., Disp: 8 patch, Rfl: 1   Ginkgo Biloba 60 MG CAPS, Take by mouth., Disp: , Rfl:    Multiple Vitamin (MULTIVITAMIN PO), Take by mouth., Disp: , Rfl:    NIFEdipine  (PROCARDIA -XL/NIFEDICAL-XL) 30 MG 24 hr tablet, TAKE (1) TABLET BY MOUTH ONCE DAILY., Disp: 90 tablet, Rfl: 2   Omega-3 1000 MG CAPS, Take 1,000 mg by mouth daily., Disp: , Rfl:    Progesterone  Micronized (PROGESTERONE , BULK,) POWD, She is using compounded progesterone  SR 125mg  nightly, except Sundays, Disp: , Rfl:     rosuvastatin  (CRESTOR ) 5 MG tablet, Take 1 tablet (5 mg total) by mouth daily., Disp: 90 tablet, Rfl: 0   UNABLE TO FIND, Med Name: Seamoss, Disp: , Rfl:    VITAMIN D  PO, Take 1 capsule by mouth daily., Disp: , Rfl:    ALPRAZolam  (XANAX ) 0.5 MG tablet, TAKE (1/2) TO (1) TABLET TWICE DAILY AS NEEDED. (Patient not taking: Reported on 11/21/2023), Disp: 20 tablet, Rfl: 0   cetirizine (ZYRTEC) 10 MG tablet, Take 10 mg by mouth daily as needed for allergies., Disp: , Rfl:    diclofenac  (VOLTAREN ) 75 MG EC tablet, Take 1 tablet (75 mg total) by mouth 2 (two) times daily., Disp: 30 tablet, Rfl: 2   hydrochlorothiazide  (HYDRODIURIL ) 25 MG tablet, Take 1 tablet (25 mg total) by mouth daily. Prn swelling (Patient not taking: Reported on 12/11/2023), Disp: 90 tablet, Rfl: 0   OVER THE COUNTER MEDICATION, Take 15 mLs by mouth at bedtime. Rejuvicare collagen liquid, Disp: , Rfl:    Probiotic Product (ALIGN) 4 MG CAPS, Take 4 mg by mouth daily., Disp: , Rfl:    promethazine -dextromethorphan (PROMETHAZINE -DM) 6.25-15 MG/5ML syrup, Take 5 mLs by mouth 4 (four) times daily as needed. (Patient not taking: Reported on 12/11/2023), Disp: 118 mL, Rfl: 0   Allergies  Allergen Reactions   Amlodipine  Other (See Comments)    Causes swelling   Amoxicillin  Diarrhea   Hydrocodone  Itching   Lisinopril     Headache and dizziness   Metronidazole Hives   Norvasc  [Amlodipine  Besylate]     Unknown reaction    Plaquenil [Hydroxychloroquine Sulfate] Hives and Itching   Gabapentin      drowsy     Review of Systems  Constitutional: Negative.   Eyes:  Negative for blurred vision.  Respiratory: Negative.    Cardiovascular: Negative.  Negative for chest pain and palpitations.  Gastrointestinal: Negative.   Neurological: Negative.   Psychiatric/Behavioral: Negative.       Today's Vitals   12/11/23 1414  BP: 120/70  Pulse: (!) 57  Temp: 98.2 F (36.8 C)  TempSrc: Oral  Weight: 191 lb (86.6 kg)  Height: 5' 5 (1.651 m)   PainSc: 0-No pain   Body mass index is 31.78 kg/m.  Wt Readings from Last 3 Encounters:  12/11/23 191 lb (86.6 kg)  11/21/23 190 lb (86.2 kg)  09/04/23 192 lb 12.8 oz (87.5 kg)    The 10-year ASCVD risk score (Arnett DK, et al., 2019) is: 6.9%   Values used to calculate the score:     Age: 23 years     Clincally relevant sex: Female     Is Non-Hispanic African American: Yes     Diabetic: No     Tobacco smoker: No     Systolic Blood Pressure: 120 mmHg  Is BP treated: Yes     HDL Cholesterol: 61 mg/dL     Total Cholesterol: 208 mg/dL  Objective:  Physical Exam Vitals and nursing note reviewed.  Constitutional:      Appearance: Normal appearance.  HENT:     Head: Normocephalic and atraumatic.   Eyes:     Extraocular Movements: Extraocular movements intact.    Cardiovascular:     Rate and Rhythm: Normal rate and regular rhythm.     Heart sounds: Normal heart sounds.  Pulmonary:     Effort: Pulmonary effort is normal.     Breath sounds: Normal breath sounds.   Musculoskeletal:     Cervical back: Normal range of motion.   Skin:    General: Skin is warm.   Neurological:     General: No focal deficit present.     Mental Status: She is alert.   Psychiatric:        Mood and Affect: Mood normal.        Behavior: Behavior normal.           Assessment And Plan:  Female climacteric state Assessment & Plan: Experiencing increased menopausal symptoms. Advised against increasing estrogen dose due to risks of prolonged HRT. Stress may exacerbate symptoms. - Continue current estrogen patch dose of 0.025 mg twice a week for two more weeks. - Continue with progesterone  SR caps nightly, will refill - Monitor symptoms and stress triggers, and document them. - Consider alternative medications for hot flashes if symptoms persist.   Frontal fibrosing alopecia Assessment & Plan: Diagnosed with alopecia, seeking second opinion due to dissatisfaction with current  treatment. - Refer to Dr. Alm, a dermatologist, for a second opinion on alopecia.  Orders: -     Ambulatory referral to Dermatology  Essential hypertension, benign Assessment & Plan: Chronic, goal BP < 120/80.  She will continue with hydrochlorothiazide  and nifedipine  daily as per PCP. She is encouraegd to follow low sodium diet.   Class 1 obesity due to excess calories with serious comorbidity and body mass index (BMI) of 31.0 to 31.9 in adult Assessment & Plan: Concerned about weight gain. Suggested structured meal plan and metabolic testing due to menopausal changes. - Refer to a weight loss clinic for a structured meal plan and metabolic testing. - Encourage indoor exercise due to external factors preventing outdoor activity.  Orders: -     Amb Ref to Medical Weight Management  Stress due to family tension Assessment & Plan: Dealing with stress related to family issues. Discussed stress management and setting boundaries. - Encourage stress management techniques and setting boundaries with family.     Return in about 3 months (around 03/12/2024) for BHRT f/u.  Patient was given opportunity to ask questions. Patient verbalized understanding of the plan and was able to repeat key elements of the plan. All questions were answered to their satisfaction.    I, Catheryn LOISE Slocumb, MD, have reviewed all documentation for this visit. The documentation on 12/11/23 for the exam, diagnosis, procedures, and orders are all accurate and complete.   IF YOU HAVE BEEN REFERRED TO A SPECIALIST, IT MAY TAKE 1-2 WEEKS TO SCHEDULE/PROCESS THE REFERRAL. IF YOU HAVE NOT HEARD FROM US /SPECIALIST IN TWO WEEKS, PLEASE GIVE US  A CALL AT [phone].

## 2023-12-12 DIAGNOSIS — Z638 Other specified problems related to primary support group: Secondary | ICD-10-CM | POA: Insufficient documentation

## 2023-12-12 NOTE — Assessment & Plan Note (Addendum)
 Dealing with stress related to family issues. Discussed stress management and setting boundaries. - Encourage stress management techniques and setting boundaries with family.

## 2023-12-12 NOTE — Patient Instructions (Signed)
 Exercising to Stay Healthy To become healthy and stay healthy, it is recommended that you do moderate-intensity and vigorous-intensity exercise. You can tell that you are exercising at a moderate intensity if your heart starts beating faster and you start breathing faster but can still hold a conversation. You can tell that you are exercising at a vigorous intensity if you are breathing much harder and faster and cannot hold a conversation while exercising. How can exercise benefit me? Exercising regularly is important. It has many health benefits, such as: Improving overall fitness, flexibility, and endurance. Increasing bone density. Helping with weight control. Decreasing body fat. Increasing muscle strength and endurance. Reducing stress and tension, anxiety, depression, or anger. Improving overall health. What guidelines should I follow while exercising? Before you start a new exercise program, talk with your health care provider. Do not exercise so much that you hurt yourself, feel dizzy, or get very short of breath. Wear comfortable clothes and wear shoes with good support. Drink plenty of water while you exercise to prevent dehydration or heat stroke. Work out until your breathing and your heartbeat get faster (moderate intensity). How often should I exercise? Choose an activity that you enjoy, and set realistic goals. Your health care provider can help you make an activity plan that is individually designed and works best for you. Exercise regularly as told by your health care provider. This may include: Doing strength training two times a week, such as: Lifting weights. Using resistance bands. Push-ups. Sit-ups. Yoga. Doing a certain intensity of exercise for a given amount of time. Choose from these options: A total of 150 minutes of moderate-intensity exercise every week. A total of 75 minutes of vigorous-intensity exercise every week. A mix of moderate-intensity and  vigorous-intensity exercise every week. Children, pregnant women, people who have not exercised regularly, people who are overweight, and older adults may need to talk with a health care provider about what activities are safe to perform. If you have a medical condition, be sure to talk with your health care provider before you start a new exercise program. What are some exercise ideas? Moderate-intensity exercise ideas include: Walking 1 mile (1.6 km) in about 15 minutes. Biking. Hiking. Golfing. Dancing. Water aerobics. Vigorous-intensity exercise ideas include: Walking 4.5 miles (7.2 km) or more in about 1 hour. Jogging or running 5 miles (8 km) in about 1 hour. Biking 10 miles (16.1 km) or more in about 1 hour. Lap swimming. Roller-skating or in-line skating. Cross-country skiing. Vigorous competitive sports, such as football, basketball, and soccer. Jumping rope. Aerobic dancing. What are some everyday activities that can help me get exercise? Yard work, such as: Child psychotherapist. Raking and bagging leaves. Washing your car. Pushing a stroller. Shoveling snow. Gardening. Washing windows or floors. How can I be more active in my day-to-day activities? Use stairs instead of an elevator. Take a walk during your lunch break. If you drive, park your car farther away from your work or school. If you take public transportation, get off one stop early and walk the rest of the way. Stand up or walk around during all of your indoor phone calls. Get up, stretch, and walk around every 30 minutes throughout the day. Enjoy exercise with a friend. Support to continue exercising will help you keep a regular routine of activity. Where to find more information You can find more information about exercising to stay healthy from: U.[place]. Department of Health and Human Services: ThisPath.fi Centers for Disease Control and Prevention (  CDC): FootballExhibition.com.br Summary Exercising regularly is  important. It will improve your overall fitness, flexibility, and endurance. Regular exercise will also improve your overall health. It can help you control your weight, reduce stress, and improve your bone density. Do not exercise so much that you hurt yourself, feel dizzy, or get very short of breath. Before you start a new exercise program, talk with your health care provider. This information is not intended to replace advice given to you by your health care provider. Make sure you discuss any questions you have with your health care provider. Document Revised: 10/01/2020 Document Reviewed: 10/01/2020 Elsevier Patient Education  2024 [employer].

## 2023-12-12 NOTE — Assessment & Plan Note (Signed)
 Diagnosed with alopecia, seeking second opinion due to dissatisfaction with current treatment. - Refer to Dr. Alm, a dermatologist, for a second opinion on alopecia.

## 2023-12-12 NOTE — Assessment & Plan Note (Addendum)
 Experiencing increased menopausal symptoms. Advised against increasing estrogen dose due to risks of prolonged HRT. Stress may exacerbate symptoms. - Continue current estrogen patch dose of 0.025 mg twice a week for two more weeks. - Continue with progesterone  SR caps nightly, will refill - Monitor symptoms and stress triggers, and document them. - Consider alternative medications for hot flashes if symptoms persist.

## 2023-12-12 NOTE — Assessment & Plan Note (Signed)
 Concerned about weight gain. Suggested structured meal plan and metabolic testing due to menopausal changes. - Refer to a weight loss clinic for a structured meal plan and metabolic testing. - Encourage indoor exercise due to external factors preventing outdoor activity.

## 2023-12-12 NOTE — Assessment & Plan Note (Signed)
Chronic, goal BP < 120/80.  She will continue with hydrochlorothiazide and nifedipine daily as per PCP. She is encouraegd to follow low sodium diet.

## 2023-12-13 ENCOUNTER — Encounter (INDEPENDENT_AMBULATORY_CARE_PROVIDER_SITE_OTHER): Payer: Self-pay

## 2023-12-17 ENCOUNTER — Encounter (INDEPENDENT_AMBULATORY_CARE_PROVIDER_SITE_OTHER): Payer: Self-pay

## 2023-12-17 ENCOUNTER — Ambulatory Visit (INDEPENDENT_AMBULATORY_CARE_PROVIDER_SITE_OTHER): Admitting: Adult Health

## 2023-12-17 ENCOUNTER — Encounter (INDEPENDENT_AMBULATORY_CARE_PROVIDER_SITE_OTHER): Payer: Self-pay | Admitting: Adult Health

## 2023-12-17 VITALS — BP 138/85 | HR 59 | Temp 98.4°F | Ht 65.0 in | Wt 185.0 lb

## 2023-12-17 DIAGNOSIS — Z0289 Encounter for other administrative examinations: Secondary | ICD-10-CM

## 2023-12-17 DIAGNOSIS — R7303 Prediabetes: Secondary | ICD-10-CM

## 2023-12-17 DIAGNOSIS — E669 Obesity, unspecified: Secondary | ICD-10-CM

## 2023-12-17 DIAGNOSIS — Z6830 Body mass index (BMI) 30.0-30.9, adult: Secondary | ICD-10-CM

## 2023-12-17 DIAGNOSIS — E785 Hyperlipidemia, unspecified: Secondary | ICD-10-CM

## 2023-12-17 DIAGNOSIS — I10 Essential (primary) hypertension: Secondary | ICD-10-CM | POA: Diagnosis not present

## 2023-12-17 DIAGNOSIS — E559 Vitamin D deficiency, unspecified: Secondary | ICD-10-CM | POA: Diagnosis not present

## 2023-12-17 NOTE — Progress Notes (Signed)
 Office: [phone]  /  Fax: [phone]   Initial Visit    [name] was seen in clinic today to evaluate for obesity. She is interested in losing weight to improve overall health and reduce the risk of weight related complications. She presents today to review program treatment options, initial physical assessment, and evaluation.     She was referred by: Specialist  When asked what else they would like to accomplish? She states: Adopt a healthier eating pattern and lifestyle, Improve energy levels and physical activity, Improve existing medical conditions, Improve quality of life, and Current Weight 185 lbs, Goal Weight 177 lbs  When asked how has your weight affected you? She states: Contributed to medical problems, Contributed to orthopedic problems or mobility issues, Having fatigue, Having poor endurance, and Problems with eating patterns  Weight history: Weight gain since hysterectomy in 2008  Highest weight: 191 lbs  Some associated conditions: Hypertension, Hyperlipidemia, Prediabetes, and Vitamin D  Deficiency  Contributing factors: disruption of circadian rhythm / sleep disordered breathing, consumption of processed foods, use of obesogenic medications: Psychotropic medications and Other: PRN Xanax , reduced physical activity, and menopause  Weight promoting medications identified: Psychotropic medications and Other: PRN Xanax   Prior weight loss attempts: None  Current nutrition plan: Other: Increasing daily water  intake  Current level of physical activity: None  Current or previous pharmacotherapy: Other: Lipotropic Injections with Dr. Jarold  Response to medication: She has had one injection of Lipotropic therapy and she feels that she has lost several lbs in one week   Past medical history includes:   Past Medical History:  Diagnosis Date   Current use of estrogen therapy 02/24/2015   Depressed 04/21/2015   Gastroesophageal reflux disease    Hair loss  02/24/2015   Hot flashes 02/24/2015   Hypertension      Objective    BP 138/85   Pulse (!) 59   Temp 98.4 F (36.9 C)   Ht 5' 5 (1.651 m)   Wt 185 lb (83.9 kg)   SpO2 97%   BMI 30.79 kg/m  She was weighed on the bioimpedance scale: Body mass index is 30.79 kg/m.  Body Fat%:42.6, Visceral Fat Rating:11, Weight trend over the last 12 months: Increasing  General:  Alert, oriented and cooperative. Patient is in no acute distress.  Respiratory: Normal respiratory effort, no problems with respiration noted   Gait: able to ambulate independently  Mental Status: Normal mood and affect. Normal behavior. Normal judgment and thought content.   DIAGNOSTIC DATA REVIEWED:  BMET    Component Value Date/Time   NA 141 11/21/2023 1154   K 4.8 11/21/2023 1154   CL 106 11/21/2023 1154   CO2 22 11/21/2023 1154   GLUCOSE 98 11/21/2023 1154   GLUCOSE 108 (H) 04/23/2017 1800   BUN 16 11/21/2023 1154   CREATININE 0.94 11/21/2023 1154   CREATININE 0.93 12/17/2013 0915   CALCIUM  9.2 11/21/2023 1154   GFRNONAA 64 06/29/2020 1656   GFRAA 74 06/29/2020 1656   Lab Results  Component Value Date   HGBA1C 5.8 (H) 11/21/2023   HGBA1C 5.6 10/02/2016   Lab Results  Component Value Date   INSULIN  32.7 (H) 12/19/2021   CBC    Component Value Date/Time   WBC 6.1 12/06/2023 1410   WBC 7.3 04/23/2017 1800   RBC 4.64 12/06/2023 1410   RBC 4.42 04/23/2017 1800   HGB 13.1 12/06/2023 1410   HCT 42.1 12/06/2023 1410   PLT 282 12/06/2023 1410   MCV  91 12/06/2023 1410   MCH 28.2 12/06/2023 1410   MCH 29.0 04/23/2017 1800   MCHC 31.1 (L) 12/06/2023 1410   MCHC 33.2 04/23/2017 1800   RDW 14.2 12/06/2023 1410   Iron/TIBC/Ferritin/ %Sat    Component Value Date/Time   IRON 46 12/19/2021 1720   TIBC 272 12/19/2021 1720   FERRITIN 283 (H) 12/19/2021 1720   IRONPCTSAT 17 12/19/2021 1720   Lipid Panel     Component Value Date/Time   CHOL 208 (H) 11/21/2023 1154   TRIG 63 11/21/2023 1154   HDL 61  11/21/2023 1154   CHOLHDL 3.4 11/21/2023 1154   LDLCALC 136 (H) 11/21/2023 1154   Hepatic Function Panel     Component Value Date/Time   PROT 6.5 11/21/2023 1154   ALBUMIN 4.4 11/21/2023 1154   AST 17 11/21/2023 1154   ALT 15 11/21/2023 1154   ALKPHOS 99 11/21/2023 1154   BILITOT 0.5 11/21/2023 1154   BILIDIR 0.13 11/21/2023 1154      Component Value Date/Time   TSH 1.810 03/14/2021 1003     Assessment and Plan   Essential hypertension, benign  Prediabetes  Hyperlipidemia, unspecified hyperlipidemia type  Vitamin D  deficiency  Obesity (BMI 30-39.9), STARTING BMI 30.8   Assessment and Plan          ESTABLISH WITH [facility]   Obesity Treatment / Action Plan:  Patient will work on garnering support from family and friends to begin weight loss journey. Will work on eliminating or reducing the presence of highly palatable, calorie dense foods in the home. Will complete provided nutritional and psychosocial assessment questionnaire before the next appointment. Will be scheduled for indirect calorimetry to determine resting energy expenditure in a fasting state.  This will allow us  to create a reduced calorie, high-protein meal plan to promote loss of fat mass while preserving muscle mass. Counseled on the health benefits of losing 5%-15% of total body weight. Was counseled on nutritional approaches to weight loss and benefits of reducing processed foods and consuming plant-based foods and high quality protein as part of nutritional weight management. Was counseled on pharmacotherapy and role as an adjunct in weight management.   Obesity Education Performed Today:  She was weighed on the bioimpedance scale and results were discussed and documented in the synopsis.  We discussed obesity as a disease and the importance of a more detailed evaluation of all the factors contributing to the disease.  We discussed the importance of long term lifestyle changes which include  nutrition, exercise and behavioral modifications as well as the importance of customizing this to her specific health and social needs.  We discussed the benefits of reaching a healthier weight to alleviate the symptoms of existing conditions and reduce the risks of the biomechanical, metabolic and psychological effects of obesity.  We reviewed the four pillars of obesity medicine and importance of using a multimodal approach.  We reviewed the basic principles in weight management.   Jehieli D Colville appears to be in the action stage of change and states they are ready to start intensive lifestyle modifications and behavioral modifications.  I have spent 30 minutes in the care of the patient today including: 5 minutes before the visit reviewing and preparing the chart. 20 minutes face-to-face assessing and reviewing listed medical problems as outlined in obesity care plan, providing nutritional and behavioral counseling on topics outlined in the obesity care plan, counseling regarding anti-obesity medication as outlined in obesity care plan, independently interpreting test results and goals of  care, as described in assessment and plan, reviewing and discussing biometric information and progress, and reviewing latest PCP notes and specialist consultations 5 minutes after the visit updating chart and documentation of encounter.  Reviewed by clinician on day of visit: allergies, medications, problem list, medical history, surgical history, family history, social history, and previous encounter notes pertinent to obesity diagnosis.   Keondria Siever d. Isatou Agredano, NP-C

## 2023-12-25 DIAGNOSIS — G4733 Obstructive sleep apnea (adult) (pediatric): Secondary | ICD-10-CM | POA: Diagnosis not present

## 2023-12-25 DIAGNOSIS — L720 Epidermal cyst: Secondary | ICD-10-CM | POA: Diagnosis not present

## 2023-12-25 DIAGNOSIS — L818 Other specified disorders of pigmentation: Secondary | ICD-10-CM | POA: Diagnosis not present

## 2024-01-02 ENCOUNTER — Ambulatory Visit: Payer: Self-pay | Admitting: Internal Medicine

## 2024-01-03 DIAGNOSIS — H25811 Combined forms of age-related cataract, right eye: Secondary | ICD-10-CM | POA: Diagnosis not present

## 2024-01-03 DIAGNOSIS — Z01818 Encounter for other preprocedural examination: Secondary | ICD-10-CM | POA: Diagnosis not present

## 2024-01-14 DIAGNOSIS — L720 Epidermal cyst: Secondary | ICD-10-CM | POA: Diagnosis not present

## 2024-01-30 ENCOUNTER — Encounter: Payer: Self-pay | Admitting: Family Medicine

## 2024-01-30 ENCOUNTER — Ambulatory Visit: Admitting: Family Medicine

## 2024-01-30 VITALS — BP 129/79 | HR 56 | Temp 97.7°F | Ht 65.0 in | Wt 188.0 lb

## 2024-01-30 DIAGNOSIS — I10 Essential (primary) hypertension: Secondary | ICD-10-CM

## 2024-01-30 DIAGNOSIS — E785 Hyperlipidemia, unspecified: Secondary | ICD-10-CM

## 2024-01-30 DIAGNOSIS — Z79899 Other long term (current) drug therapy: Secondary | ICD-10-CM | POA: Diagnosis not present

## 2024-01-30 NOTE — Progress Notes (Signed)
   Subjective:    Patient ID: [name], female    DOB: [birth year], 63 y.o.   MRN: [id number]  HPI  Hyperlipidemia follow up - stopped taking crestor  due to red blotches  Is also completing abx for cyst on back of the neck per Dr Shona Patient taking her medicine regular basis Denies any major setbacks Stress levels are manageable Tries to eat healthy Stopped taking her Crestor  as per above   Review of Systems     Objective:   Physical Exam  General-in no acute distress Eyes-no discharge Lungs-respiratory rate normal, CTA CV-no murmurs,RRR Extremities skin warm dry no edema Neuro grossly normal Behavior normal, alert       Assessment & Plan:  1. Essential hypertension, benign (Primary) Continue current medication check lab work before next visit healthy eating regular activity - Basic Metabolic Panel  2. Hyperlipidemia, unspecified hyperlipidemia type Patient had an unusual red rash related to her Crestor  she thought so she stopped it she is willing to restart it if she has the same problem again to let us  know otherwise take the medication check lab work in approximately 3 months - Lipid Panel  3. High risk medication use Labs ordered as per above - Hepatic Function Panel  Follow-up 6 months

## 2024-02-01 DIAGNOSIS — H25811 Combined forms of age-related cataract, right eye: Secondary | ICD-10-CM | POA: Diagnosis not present

## 2024-02-01 DIAGNOSIS — G4733 Obstructive sleep apnea (adult) (pediatric): Secondary | ICD-10-CM | POA: Diagnosis not present

## 2024-02-01 DIAGNOSIS — F418 Other specified anxiety disorders: Secondary | ICD-10-CM | POA: Diagnosis not present

## 2024-02-04 ENCOUNTER — Other Ambulatory Visit: Payer: Self-pay | Admitting: Internal Medicine

## 2024-02-20 ENCOUNTER — Ambulatory Visit (INDEPENDENT_AMBULATORY_CARE_PROVIDER_SITE_OTHER): Admitting: Family Medicine

## 2024-02-20 ENCOUNTER — Encounter (INDEPENDENT_AMBULATORY_CARE_PROVIDER_SITE_OTHER): Payer: Self-pay

## 2024-02-21 ENCOUNTER — Encounter (INDEPENDENT_AMBULATORY_CARE_PROVIDER_SITE_OTHER): Payer: Self-pay

## 2024-02-24 ENCOUNTER — Other Ambulatory Visit: Payer: Self-pay | Admitting: Family Medicine

## 2024-02-24 DIAGNOSIS — M5416 Radiculopathy, lumbar region: Secondary | ICD-10-CM

## 2024-02-24 DIAGNOSIS — M5441 Lumbago with sciatica, right side: Secondary | ICD-10-CM

## 2024-02-25 ENCOUNTER — Other Ambulatory Visit: Payer: Self-pay | Admitting: Internal Medicine

## 2024-02-25 ENCOUNTER — Encounter (INDEPENDENT_AMBULATORY_CARE_PROVIDER_SITE_OTHER): Payer: Self-pay | Admitting: Family Medicine

## 2024-02-25 ENCOUNTER — Other Ambulatory Visit: Payer: Self-pay

## 2024-02-25 MED ORDER — PROGESTERONE MICRONIZED POWD: Status: AC

## 2024-02-25 NOTE — Telephone Encounter (Unsigned)
 Copied from CRM [id number]. Topic: Clinical - Medication Refill >> Feb 25, 2024  1:04 PM Drema MATSU wrote: Medication: Progesterone  Micronized (PROGESTERONE , BULK,) POWD  Has the patient contacted their pharmacy? Yes (Agent: If no, request that the patient contact the pharmacy for the refill. If patient does not wish to contact the pharmacy document the reason why and proceed with request.) advised havent had a response from provider (Agent: If yes, when and what did the pharmacy advise?)  This is the patient's preferred pharmacy:  [facility] - [place], [place] - [address] [place] [address] Phone: [phone] Fax: [phone]  Is this the correct pharmacy for this prescription? Yes If no, delete pharmacy and type the correct one.   Has the prescription been filled recently? Yes  Is the patient out of the medication? Yes Patient is going out of town in the morning and wants to know if she can have five pills until it is approved   Has the patient been seen for an appointment in the last year OR does the patient have an upcoming appointment? Yes  Can we respond through MyChart? Yes  Agent: Please be advised that Rx refills may take up to 3 business days. We ask that you follow-up with your pharmacy.

## 2024-03-05 ENCOUNTER — Ambulatory Visit (INDEPENDENT_AMBULATORY_CARE_PROVIDER_SITE_OTHER): Admitting: Family Medicine

## 2024-03-13 ENCOUNTER — Ambulatory Visit: Admitting: Internal Medicine

## 2024-03-13 ENCOUNTER — Encounter: Payer: Self-pay | Admitting: Internal Medicine

## 2024-03-13 VITALS — BP 123/82 | HR 65 | Temp 98.3°F | Ht 65.0 in | Wt 190.6 lb

## 2024-03-13 DIAGNOSIS — R7309 Other abnormal glucose: Secondary | ICD-10-CM | POA: Diagnosis not present

## 2024-03-13 DIAGNOSIS — E6609 Other obesity due to excess calories: Secondary | ICD-10-CM

## 2024-03-13 DIAGNOSIS — L658 Other specified nonscarring hair loss: Secondary | ICD-10-CM | POA: Diagnosis not present

## 2024-03-13 DIAGNOSIS — N951 Menopausal and female climacteric states: Secondary | ICD-10-CM | POA: Diagnosis not present

## 2024-03-13 DIAGNOSIS — Z6831 Body mass index (BMI) 31.0-31.9, adult: Secondary | ICD-10-CM

## 2024-03-13 DIAGNOSIS — E7800 Pure hypercholesterolemia, unspecified: Secondary | ICD-10-CM | POA: Diagnosis not present

## 2024-03-13 DIAGNOSIS — E66811 Obesity, class 1: Secondary | ICD-10-CM

## 2024-03-13 MED ORDER — ESTRADIOL 0.0375 MG/24HR TD PTTW: 1.0000 | MEDICATED_PATCH | TRANSDERMAL | 0 refills | Status: DC

## 2024-03-13 NOTE — Progress Notes (Signed)
 I,Victoria T Emmitt, CMA,acting as a Neurosurgeon for Catheryn LOISE Slocumb, MD.,have documented all relevant documentation on the behalf of Catheryn LOISE Slocumb, MD,as directed by  Catheryn LOISE Slocumb, MD while in the presence of Catheryn LOISE Slocumb, MD.  Subjective:  Patient ID: [name] , female    DOB: [birth year] , 63 y.o.   MRN: [id number]  Chief Complaint  Patient presents with   BHRT    She is here today for f/u BHRT. She is currently using estradiol  patches twice weekly. She is also taking progesterone  nightly. Patient has no other concerns. She feels well on her current regimen.       HPI Discussed the use of AI scribe software for clinical note transcription with the patient, who gave verbal consent to proceed.  History of Present Illness [name] is a 63 year old female who presents for a follow-up on hormone therapy.  She is currently on hormone replacement therapy, taking progesterone  125 mg every night except Sunday and using estradiol  patches twice a week. Her progesterone  prescription had expired but was refilled as she was preparing to travel. She has not had any lab work since June.  She is scheduled to see a dermatologist in February for her alopecia, which was previously diagnosed by another dermatologist. She has not had any recent blood work related to her hair condition, and it has been a couple of years since her iron levels were checked.  She missed an appointment related to weight management due to illness and plans to reschedule. She has not been exercising regularly but intends to start participating in activities at a local recreation center. She reports good sleep and recently went on a relaxing vacation.  She is on medication for cholesterol management and has a history of elevated lipoprotein A.     Hypertension This is a chronic problem. The current episode started more than 1 year ago. The problem has been gradually improving since onset. Pertinent negatives  include no blurred vision, chest pain or palpitations. Risk factors for coronary artery disease include sedentary lifestyle and post-menopausal state. Past treatments include diuretics and calcium  channel blockers. The current treatment provides moderate improvement.     Past Medical History:  Diagnosis Date   Current use of estrogen therapy 02/24/2015   Depressed 04/21/2015   Gastroesophageal reflux disease    Hair loss 02/24/2015   Hot flashes 02/24/2015   Hypertension      Family History  Problem Relation Age of Onset   Diabetes type II Mother    Coronary artery disease Mother    Diabetes type II Father    Coronary artery disease Father    Hypertension Father    Diabetes type II Sister    Kidney disease Sister    Thyroid  disease Sister    Hypertension Sister    Hypertension Sister    Asthma Sister    Diabetes Brother    Hypertension Brother    Colon cancer Neg Hx    Colon polyps Neg Hx    Breast cancer Neg Hx    Ovarian cancer Neg Hx    Uterine cancer Neg Hx    Ulcerative colitis Neg Hx    Celiac disease Neg Hx    Crohn's disease Neg Hx      Current Outpatient Medications:    cetirizine (ZYRTEC) 10 MG tablet, Take 10 mg by mouth daily as needed for allergies., Disp: , Rfl:    diclofenac  (VOLTAREN ) 75 MG EC tablet, Take  1 tablet (75 mg total) by mouth 2 (two) times daily., Disp: 30 tablet, Rfl: 2   escitalopram  (LEXAPRO ) 20 MG tablet, Take 1 tablet (20 mg total) by mouth daily., Disp: 90 tablet, Rfl: 1   hydrochlorothiazide  (HYDRODIURIL ) 25 MG tablet, Take 1 tablet (25 mg total) by mouth daily. Prn swelling, Disp: 90 tablet, Rfl: 0   Misc Natural Products (LIPOTROPIX PO), Inject as directed., Disp: , Rfl:    Multiple Vitamin (MULTIVITAMIN PO), Take by mouth., Disp: , Rfl:    NIFEdipine  (PROCARDIA -XL/NIFEDICAL-XL) 30 MG 24 hr tablet, TAKE (1) TABLET BY MOUTH ONCE DAILY., Disp: 90 tablet, Rfl: 2   Nutritional Supplements (LIPOTROPIC COMPLEX PO), Take by mouth., Disp: , Rfl:     Omega-3 1000 MG CAPS, Take 1,000 mg by mouth daily., Disp: , Rfl:    OVER THE COUNTER MEDICATION, Take 15 mLs by mouth at bedtime. Rejuvicare collagen liquid, Disp: , Rfl:    Probiotic Product (ALIGN) 4 MG CAPS, Take 4 mg by mouth daily., Disp: , Rfl:    Progesterone  Micronized (PROGESTERONE , BULK,) POWD, She is using compounded progesterone  SR 125mg  nightly, except Sundays, Disp: , Rfl:    rosuvastatin  (CRESTOR ) 5 MG tablet, Take 1 tablet (5 mg total) by mouth daily., Disp: 90 tablet, Rfl: 0   UNABLE TO FIND, Med Name: Seamoss, Disp: , Rfl:    VITAMIN D  PO, Take 1 capsule by mouth daily., Disp: , Rfl:    estradiol  (VIVELLE -DOT) 0.0375 MG/24HR, Place 1 patch onto the skin 2 (two) times a week., Disp: 8 patch, Rfl: 0   Allergies  Allergen Reactions   Amlodipine  Other (See Comments)    Causes swelling   Amoxicillin  Diarrhea   Hydrocodone  Itching   Lisinopril     Headache and dizziness   Metronidazole Hives   Norvasc  [Amlodipine  Besylate]     Unknown reaction    Plaquenil [Hydroxychloroquine Sulfate] Hives and Itching   Gabapentin      drowsy     Review of Systems  Constitutional: Negative.   Eyes:  Negative for blurred vision.  Respiratory: Negative.    Cardiovascular: Negative.  Negative for chest pain and palpitations.  Gastrointestinal: Negative.   Neurological: Negative.   Psychiatric/Behavioral: Negative.       Today's Vitals   03/13/24 1409  BP: 123/82  Pulse: 65  Temp: 98.3 F (36.8 C)  SpO2: 98%  Weight: 190 lb 9.6 oz (86.5 kg)  Height: 5' 5 (1.651 m)   Body mass index is 31.72 kg/m.  Wt Readings from Last 3 Encounters:  03/13/24 190 lb 9.6 oz (86.5 kg)  01/30/24 188 lb (85.3 kg)  12/17/23 185 lb (83.9 kg)     Objective:  Physical Exam Vitals and nursing note reviewed.  Constitutional:      Appearance: Normal appearance.  HENT:     Head: Normocephalic and atraumatic.  Eyes:     Extraocular Movements: Extraocular movements intact.  Cardiovascular:      Rate and Rhythm: Normal rate and regular rhythm.     Heart sounds: Normal heart sounds.  Pulmonary:     Effort: Pulmonary effort is normal.     Breath sounds: Normal breath sounds.  Musculoskeletal:     Cervical back: Normal range of motion.  Skin:    General: Skin is warm.  Neurological:     General: No focal deficit present.     Mental Status: She is alert.  Psychiatric:        Mood and Affect: Mood normal.  Behavior: Behavior normal.       Assessment And Plan:  Female climacteric state Assessment & Plan: Currently on estradiol  patches and progesterone . - Refill estradiol  patches at 0.0375 mg twice weekly. - Ensure progesterone  prescription is refilled. - Consider reducing estrogen patch dosage in the fall.   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 Blood work planned to assess thyroid  function and iron levels. - Order blood work for thyroid  function and iron levels. - Prepare blood work results for dermatologist appointment in February.  Orders: -     TSH -     ANA, IFA (with reflex) -     Iron, TIBC and Ferritin Panel  Pure hypercholesterolemia Assessment & Plan: On medication for hyperlipidemia. Elevated lipoprotein(a) indicates genetic predisposition to heart disease. - Perform blood work in October to monitor cholesterol levels. - Follow heart healthy lifestyle   Other abnormal glucose Assessment & Plan: Previous labs reviewed, her A1c has been elevated in the past. I will check an A1c today. Reminded to avoid refined sugars including sugary drinks/foods and processed meats including bacon, sausages and deli meats.    Orders: -     Hemoglobin A1c  Class 1 obesity due to excess calories with serious comorbidity and body mass index (BMI) of 31.0 to 31.9 in adult Assessment & Plan: She is encouraged to strive for BMI less than 30 to decrease cardiac risk. Advised to aim for at least 150 minutes of  exercise per week.  Class 1 obesity. Missed weight management appointment. - Encourage regular exercise, including joining sessions at the recreation center. - Reschedule weight management appointment.    Other orders -     Estradiol ; Place 1 patch onto the skin 2 (two) times a week.  Dispense: 8 patch; Refill: 0 -     FANA Staining Patterns   Return for 3 MONTH BHRT F/U.SABRA  Patient was given opportunity to ask questions. Patient verbalized understanding of the plan and was able to repeat key elements of the plan. All questions were answered to their satisfaction.   I, Catheryn LOISE Slocumb, MD, have reviewed all documentation for this visit. The documentation on 03/13/24 for the exam, diagnosis, procedures, and orders are all accurate and complete.   IF YOU HAVE BEEN REFERRED TO A SPECIALIST, IT MAY TAKE 1-2 WEEKS TO SCHEDULE/PROCESS THE REFERRAL. IF YOU HAVE NOT HEARD FROM US /SPECIALIST IN TWO WEEKS, PLEASE GIVE US  A CALL AT [phone] X [phone].   THE PATIENT IS ENCOURAGED TO PRACTICE SOCIAL DISTANCING DUE TO THE COVID-19 PANDEMIC.

## 2024-03-14 LAB — ANTINUCLEAR ANTIBODIES, IFA: ANA Titer 1: POSITIVE — AB

## 2024-03-14 LAB — FANA STAINING PATTERNS

## 2024-03-14 LAB — IRON,TIBC AND FERRITIN PANEL
Ferritin: 304 ng/mL — ABNORMAL HIGH (ref 15–150)
Iron Saturation: 20 % (ref 15–55)
Iron: 53 ug/dL (ref 27–139)
Total Iron Binding Capacity: 263 ug/dL (ref 250–450)
UIBC: 210 ug/dL (ref 118–369)

## 2024-03-14 LAB — TSH: TSH: 1.1 u[IU]/mL (ref 0.450–4.500)

## 2024-03-14 LAB — HEMOGLOBIN A1C
Est. average glucose Bld gHb Est-mCnc: 128 mg/dL
Hgb A1c MFr Bld: 6.1 % — ABNORMAL HIGH (ref 4.8–5.6)

## 2024-03-15 ENCOUNTER — Ambulatory Visit: Payer: Self-pay | Admitting: Internal Medicine

## 2024-03-23 DIAGNOSIS — E7800 Pure hypercholesterolemia, unspecified: Secondary | ICD-10-CM | POA: Insufficient documentation

## 2024-03-23 NOTE — Assessment & Plan Note (Signed)
 Currently on estradiol  patches and progesterone . - Refill estradiol  patches at 0.0375 mg twice weekly. - Ensure progesterone  prescription is refilled. - Consider reducing estrogen patch dosage in the fall.

## 2024-03-23 NOTE — Assessment & Plan Note (Addendum)
 She is encouraged to strive for BMI less than 30 to decrease cardiac risk. Advised to aim for at least 150 minutes of exercise per week.  Class 1 obesity. Missed weight management appointment. - Encourage regular exercise, including joining sessions at the recreation center. - Reschedule weight management appointment.

## 2024-03-23 NOTE — Assessment & Plan Note (Addendum)
 She was previously dagnosed with alopecia. Blood work planned to assess thyroid  function and iron levels. - Order blood work for thyroid  function and iron levels. - Prepare blood work results for dermatologist appointment in February.

## 2024-03-23 NOTE — Assessment & Plan Note (Addendum)
 On medication for hyperlipidemia. Elevated lipoprotein(a) indicates genetic predisposition to heart disease. - Perform blood work in October to monitor cholesterol levels. - Follow heart healthy lifestyle

## 2024-03-23 NOTE — Assessment & Plan Note (Signed)
 Previous labs reviewed, her A1c has been elevated in the past. I will check an A1c today. Reminded to avoid refined sugars including sugary drinks/foods and processed meats including bacon, sausages and deli meats.

## 2024-03-24 ENCOUNTER — Telehealth: Payer: Self-pay | Admitting: Family Medicine

## 2024-03-24 NOTE — Telephone Encounter (Signed)
 Prescription Request  03/24/2024  LOV: 01/30/2024  What is the name of the medication or equipment? escitalopram  (LEXAPRO ) 20 MG tablet   Have you contacted your pharmacy to request a refill? Yes   Which pharmacy would you like this sent to?  [facility] [place], [place] - [id number] [place] [address] Phone: [phone] Fax: [phone]    Patient notified that their request is being sent to the clinical staff for review and that they should receive a response within 2 business days.   Please advise at [facility] [phone]

## 2024-03-25 ENCOUNTER — Other Ambulatory Visit: Payer: Self-pay

## 2024-03-25 MED ORDER — ESCITALOPRAM OXALATE 20 MG PO TABS: 20.0000 mg | ORAL_TABLET | Freq: Every day | ORAL | 1 refills | Status: DC

## 2024-03-26 ENCOUNTER — Other Ambulatory Visit: Payer: Self-pay | Admitting: Nurse Practitioner

## 2024-03-26 MED ORDER — ESCITALOPRAM OXALATE 20 MG PO TABS: 20.0000 mg | ORAL_TABLET | Freq: Every day | ORAL | 1 refills | Status: DC

## 2024-04-02 DIAGNOSIS — E785 Hyperlipidemia, unspecified: Secondary | ICD-10-CM | POA: Diagnosis not present

## 2024-04-02 DIAGNOSIS — Z79899 Other long term (current) drug therapy: Secondary | ICD-10-CM | POA: Diagnosis not present

## 2024-04-03 ENCOUNTER — Ambulatory Visit: Payer: Self-pay | Admitting: Family Medicine

## 2024-04-03 ENCOUNTER — Other Ambulatory Visit: Payer: Self-pay | Admitting: Family Medicine

## 2024-04-03 LAB — LIPID PANEL
Chol/HDL Ratio: 2.5 ratio (ref 0.0–4.4)
Cholesterol, Total: 160 mg/dL (ref 100–199)
HDL: 63 mg/dL (ref >39)
LDL Chol Calc (NIH): 82 mg/dL (ref 0–99)
Triglycerides: 81 mg/dL (ref 0–149)
VLDL Cholesterol Cal: 15 mg/dL (ref 5–40)

## 2024-04-03 LAB — BASIC METABOLIC PANEL WITH GFR
BUN/Creatinine Ratio: 15 (ref 12–28)
BUN: 15 mg/dL (ref 8–27)
CO2: 25 mmol/L (ref 20–29)
Calcium: 9.2 mg/dL (ref 8.7–10.3)
Chloride: 105 mmol/L (ref 96–106)
Creatinine, Ser: 1 mg/dL (ref 0.57–1.00)
Glucose: 94 mg/dL (ref 70–99)
Potassium: 4.4 mmol/L (ref 3.5–5.2)
Sodium: 142 mmol/L (ref 134–144)
eGFR: 63 mL/min/1.73 (ref >59)

## 2024-04-03 LAB — HEPATIC FUNCTION PANEL
ALT: 17 IU/L (ref 0–32)
AST: 18 IU/L (ref 0–40)
Albumin: 4 g/dL (ref 3.9–4.9)
Alkaline Phosphatase: 97 IU/L (ref 49–135)
Bilirubin Total: 0.5 mg/dL (ref 0.0–1.2)
Bilirubin, Direct: 0.15 mg/dL (ref 0.00–0.40)
Total Protein: 6.5 g/dL (ref 6.0–8.5)

## 2024-04-03 MED ORDER — ESCITALOPRAM OXALATE 20 MG PO TABS: 20.0000 mg | ORAL_TABLET | Freq: Every day | ORAL | 1 refills | Status: AC

## 2024-04-13 ENCOUNTER — Other Ambulatory Visit: Payer: Self-pay | Admitting: Internal Medicine

## 2024-04-13 DIAGNOSIS — N951 Menopausal and female climacteric states: Secondary | ICD-10-CM

## 2024-04-17 ENCOUNTER — Telehealth: Payer: Self-pay | Admitting: Family Medicine

## 2024-04-17 NOTE — Telephone Encounter (Signed)
 Refill on   rosuvastatin  (CRESTOR ) 5 MG tablet    [facility]

## 2024-04-20 ENCOUNTER — Other Ambulatory Visit: Payer: Self-pay | Admitting: Family Medicine

## 2024-04-20 MED ORDER — ROSUVASTATIN CALCIUM 5 MG PO TABS: 5.0000 mg | ORAL_TABLET | Freq: Every day | ORAL | 1 refills | Status: DC

## 2024-04-20 NOTE — Telephone Encounter (Signed)
Refills were sent in as requested

## 2024-05-14 ENCOUNTER — Encounter (INDEPENDENT_AMBULATORY_CARE_PROVIDER_SITE_OTHER): Payer: Self-pay

## 2024-05-26 ENCOUNTER — Ambulatory Visit (INDEPENDENT_AMBULATORY_CARE_PROVIDER_SITE_OTHER): Admitting: Family Medicine

## 2024-05-26 ENCOUNTER — Encounter (INDEPENDENT_AMBULATORY_CARE_PROVIDER_SITE_OTHER): Payer: Self-pay | Admitting: Family Medicine

## 2024-05-26 VITALS — BP 158/73 | HR 51 | Temp 98.3°F | Ht 65.0 in | Wt 184.0 lb

## 2024-05-26 DIAGNOSIS — N951 Menopausal and female climacteric states: Secondary | ICD-10-CM | POA: Diagnosis not present

## 2024-05-26 DIAGNOSIS — E7800 Pure hypercholesterolemia, unspecified: Secondary | ICD-10-CM

## 2024-05-26 DIAGNOSIS — R7303 Prediabetes: Secondary | ICD-10-CM | POA: Diagnosis not present

## 2024-05-26 DIAGNOSIS — R5383 Other fatigue: Secondary | ICD-10-CM | POA: Diagnosis not present

## 2024-05-26 DIAGNOSIS — Z6830 Body mass index (BMI) 30.0-30.9, adult: Secondary | ICD-10-CM

## 2024-05-26 DIAGNOSIS — Z1331 Encounter for screening for depression: Secondary | ICD-10-CM

## 2024-05-26 DIAGNOSIS — E669 Obesity, unspecified: Secondary | ICD-10-CM

## 2024-05-26 DIAGNOSIS — I10 Essential (primary) hypertension: Secondary | ICD-10-CM | POA: Diagnosis not present

## 2024-05-26 DIAGNOSIS — R0602 Shortness of breath: Secondary | ICD-10-CM

## 2024-05-26 DIAGNOSIS — E559 Vitamin D deficiency, unspecified: Secondary | ICD-10-CM | POA: Diagnosis not present

## 2024-05-26 NOTE — Assessment & Plan Note (Signed)
 On rosuvastatin  previously.  Off med for about a month now due to side effect of muscle aches.  Will continue to work on limiting saturated fat in diet and repeat lab in 3 months.

## 2024-05-26 NOTE — Assessment & Plan Note (Signed)
 Last Vitamin D  level in 2020 and low.  Will repeat vitamin D  level and assess course of treatment after seeing result.  Vitamin D  level today.

## 2024-05-26 NOTE — Progress Notes (Unsigned)
 Chief Complaint:  Obesity   Subjective:  [name] (MR# [id number]) is a 63 y.o. female who presents for evaluation and treatment of obesity and related comorbidities.   [name] is currently in the action stage of change and ready to dedicate time achieving and maintaining a healthier weight. [name] is interested in becoming our patient and working on intensive lifestyle modifications including (but not limited to) diet and exercise for weight loss.  [name] has been struggling with her weight. She has been unsuccessful in either losing weight, maintaining weight loss, or reaching her healthy weight goal.  She is lactose sensitive.  Has a history of kidney stones- last kidney stone was only experience of stones (1991).  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 Married and lives at home with her husband [name].  He is supportive of her, they eat meals together and she is not anticipating sabotage.  Desired weight is 155lbs and she cannot remember the last time she was 155lbs.  Previously tried Weight Watchers and Intermittent Fasting.    She and her husband eat out 3-4 times a week.  She is the main grocery shopper and her husband is the main cook.  Tends to crave carbohydrates.  Dislikes raw vegetables, liver, squid, and anchovies. Skips breakfast 3-4 times a week due to lack of hunger.  Finds eating healthier to be expensive.   Food Recall: Coffee in the am- 2 teaspoons of sugar and 4 teaspoons of hazelnut creamer.  Around 1-2pm gets a burger at [employer]- mustard, mayo, tomato, onion, pickles and cheese and sweet tea.  She eats all the burger but doesn't drink all the tea- feels like she needs something sweet afterwards like oreo cookies or reese's cups.   Feels satisfied.  Dinner is fried chicken, corn and green beans. 1-2 thighs, 1 cup corn, 1/2 cup green beans and feels mostly full but needs something sweet.  Sometimes will drink green tea, sometimes water , sometimes Dr. Nunzio.  Will have ice cream 1  cup, or [employer] (5 or more).   May grab an individual bag of potato chips.    Indirect Calorimeter completed today shows a RMR: 1037.  Her calculated basal metabolic rate is 8525 thus her basal metabolic rate is worse than expected.  Other Fatigue Quandra admits to daytime somnolence and admits to waking up still tired. Patient has a history of symptoms of morning headache and hypertension. Dimond generally gets 8 hours of sleep per night, and states that she has generally restful sleep. Snoring is present. Apneic episodes are not present. Epworth Sleepiness Score is 3.   Shortness of Breath Kilynn notes increasing shortness of breath with exercising and seems to be worsening over time with weight gain. She notes getting out of breath sooner with activity than she used to. This has not gotten worse recently. Labria denies shortness of breath at rest or orthopnea.  Depression Screen Yakelin's Food and Mood (modified PHQ-9) score was 8.     05/26/2024    8:42 AM  Depression screen PHQ 2/9  Decreased Interest 0  Down, Depressed, Hopeless 0  PHQ - 2 Score 0  Altered sleeping 0  Tired, decreased energy 1  Change in appetite 0  Feeling bad or failure about yourself  0  Trouble concentrating 1  Moving slowly or fidgety/restless 0  Suicidal thoughts 0  PHQ-9 Score 2  Difficult doing work/chores Not difficult at all     Objective:  Vitals Temp: 98.3 F (36.8  C) BP: (!) 158/73 Pulse Rate: (!) 51 SpO2: 98 %   Anthropometric Measurements Height: 5' 5 (1.651 m) Weight: 184 lb (83.5 kg) BMI (Calculated): 30.62 Starting Weight: 184 lb Peak Weight: 190 lb Waist Measurement : 44 inches   Body Composition  Body Fat %: 42.3 % Fat Mass (lbs): 101.2 lbs Muscle Mass (lbs): 101.2 lbs Total Body Water  (lbs): 70.6 lbs Visceral Fat Rating : 11   Other Clinical Data RMR: 1037 Fasting: yes Labs: yes Today's Visit #: 1 Starting Date: 05/26/24 Comments:  1    EKG: Sinus bradycardia, rate 50.  General: Cooperative, alert, well developed, in no acute distress. HEENT: Conjunctivae and lids unremarkable. Cardiovascular: Regular rhythm.  Lungs: Normal work of breathing. Neurologic: No focal deficits.   Lab Results  Component Value Date   CREATININE 1.00 04/02/2024   BUN 15 04/02/2024   NA 142 04/02/2024   K 4.4 04/02/2024   CL 105 04/02/2024   CO2 25 04/02/2024   Lab Results  Component Value Date   ALT 17 04/02/2024   AST 18 04/02/2024   ALKPHOS 97 04/02/2024   BILITOT 0.5 04/02/2024   Lab Results  Component Value Date   HGBA1C 6.1 (H) 03/13/2024   HGBA1C 5.8 (H) 11/21/2023   HGBA1C 6.2 (H) 03/02/2023   HGBA1C 6.2 (H) 08/28/2022   HGBA1C 5.9 (H) 12/19/2021   Lab Results  Component Value Date   INSULIN  32.7 (H) 12/19/2021   Lab Results  Component Value Date   TSH 1.100 03/13/2024   Lab Results  Component Value Date   CHOL 160 04/02/2024   HDL 63 04/02/2024   LDLCALC 82 04/02/2024   TRIG 81 04/02/2024   CHOLHDL 2.5 04/02/2024   Lab Results  Component Value Date   WBC 6.1 12/06/2023   HGB 13.1 12/06/2023   HCT 42.1 12/06/2023   MCV 91 12/06/2023   PLT 282 12/06/2023   Lab Results  Component Value Date   IRON 53 03/13/2024   TIBC 263 03/13/2024   FERRITIN 304 (H) 03/13/2024    Assessment and Plan:  Assessment & Plan Other fatigue  SOBOE (shortness of breath on exertion)  Prediabetes  Pure hypercholesterolemia On rosuvastatin  previously.  Off med for about a month now due to side effect of muscle aches.  Will continue to work on limiting saturated fat in diet and repeat lab in 3 months.  Menopausal symptoms  Essential hypertension BP elevated today.  She did not take her medication today.  Will follow up on BP at next appointment. Vitamin D  deficiency Last Vitamin D  level in 2020 and low.  Will repeat vitamin D  level and assess course of treatment after seeing result.  Vitamin D  level  today. BMI 30.0-30.9,adult  Obesity (BMI 30-39.9), STARTING BMI 30.8    Other Fatigue  Cythnia does feel that her weight is causing her energy to be lower than it should be. Fatigue may be related to obesity, depression or many other causes. Labs will be ordered, and in the meanwhile, Chandler will focus on self care including making healthy food choices, increasing physical activity and focusing on stress reduction.  Shortness of Breath  Ahmari does feel that she gets out of breath more easily that she used to when she exercises. Tobi's shortness of breath appears to be obesity related and exercise induced. She has agreed to work on weight loss and gradually increase exercise to treat her exercise induced shortness of breath. Will continue to monitor closely.   Problem List  Items Addressed This Visit   None Visit Diagnoses       Other fatigue    -  Primary   Relevant Orders   EKG 12-Lead     SOBOE (shortness of breath on exertion)       Relevant Orders   EKG 12-Lead       Valen is currently in the action stage of change and her goal is to continue with weight loss efforts. I recommend Manuel begin the structured treatment plan as follows:  She has agreed to {HWW Weight Loss Eojw:789035994}  Exercise goals: {MWM Exercise Recommendations:210964029}  Behavioral modification strategies:{HWW Behavior Modification:210964008}  She was informed of the importance of frequent follow-up visits to maximize her success with intensive lifestyle modifications for her multiple health conditions. She was informed we would discuss her lab results at her next visit unless there is a critical issue that needs to be addressed sooner. Chistina agreed to keep her next visit at the agreed upon time to discuss these results.  Labs ordered with plans to discuss at the next visit.   Attestation Statements: This is the patient's first visit at [facility]. The patient's NEW PATIENT  PACKET was reviewed at length. Included in the packet: current and past health history, medications, allergies, ROS, gynecologic history (women only), surgical history, family history, social history, weight history, weight loss surgery history (for those that have had weight loss surgery), nutritional evaluation, mood and food questionnaire, PHQ9, Epworth questionnaire, sleep habits questionnaire, patient life and health improvement goals questionnaire. These will all be scanned into the patient's chart under media.   During the visit, I independently reviewed the patient's EKG, bioimpedance scale results, and indirect calorimeter results. I used this information to tailor a meal plan for the patient that will help her to lose weight and will improve her obesity-related conditions going forward. I performed a medically necessary appropriate examination and/or evaluation. I discussed the assessment and treatment plan with the patient. The patient was provided an opportunity to ask questions and all were answered. The patient agreed with the plan and demonstrated an understanding of the instructions. Labs were ordered at this visit and will be reviewed at the next visit unless more critical results need to be addressed immediately. Clinical information was updated and documented in the EMR.   Time spent on visit including pre-visit chart review and post-visit care was ***60-74 minutes.   A separate 15 minutes was spent on risk counseling (see above).   Reviewed by clinician on day of visit: allergies, medications, problem list, medical history, surgical history, family history, social history, and previous encounter notes.  Time spent on visit including pre-visit chart review and post-visit charting and care was *** minutes.   Adelita Cho, MD

## 2024-05-27 LAB — CBC WITH DIFFERENTIAL/PLATELET
Basophils Absolute: 0 x10E3/uL (ref 0.0–0.2)
Basos: 1 %
EOS (ABSOLUTE): 0 x10E3/uL (ref 0.0–0.4)
Eos: 1 %
Hematocrit: 42.7 % (ref 34.0–46.6)
Hemoglobin: 13.8 g/dL (ref 11.1–15.9)
Immature Grans (Abs): 0 x10E3/uL (ref 0.0–0.1)
Immature Granulocytes: 0 %
Lymphocytes Absolute: 1.7 x10E3/uL (ref 0.7–3.1)
Lymphs: 28 %
MCH: 28.7 pg (ref 26.6–33.0)
MCHC: 32.3 g/dL (ref 31.5–35.7)
MCV: 89 fL (ref 79–97)
Monocytes Absolute: 0.4 x10E3/uL (ref 0.1–0.9)
Monocytes: 7 %
Neutrophils Absolute: 4 x10E3/uL (ref 1.4–7.0)
Neutrophils: 63 %
Platelets: 280 x10E3/uL (ref 150–450)
RBC: 4.81 x10E6/uL (ref 3.77–5.28)
RDW: 14.7 % (ref 11.7–15.4)
WBC: 6.2 x10E3/uL (ref 3.4–10.8)

## 2024-05-27 LAB — INSULIN, RANDOM: INSULIN: 9.7 u[IU]/mL (ref 2.6–24.9)

## 2024-05-27 LAB — VITAMIN D 25 HYDROXY (VIT D DEFICIENCY, FRACTURES): Vit D, 25-Hydroxy: 23.2 ng/mL — ABNORMAL LOW (ref 30.0–100.0)

## 2024-05-27 LAB — FOLATE: Folate: 19.4 ng/mL (ref >3.0)

## 2024-05-27 LAB — T3: T3, Total: 104 ng/dL (ref 71–180)

## 2024-05-27 LAB — T4, FREE: Free T4: 1.17 ng/dL (ref 0.82–1.77)

## 2024-05-27 LAB — VITAMIN B12: Vitamin B-12: 1379 pg/mL — ABNORMAL HIGH (ref 232–1245)

## 2024-06-04 ENCOUNTER — Ambulatory Visit: Payer: Self-pay | Admitting: Internal Medicine

## 2024-06-04 ENCOUNTER — Encounter: Payer: Self-pay | Admitting: Internal Medicine

## 2024-06-04 VITALS — BP 122/80 | HR 61 | Temp 98.3°F | Ht 65.0 in | Wt 192.4 lb

## 2024-06-04 DIAGNOSIS — R7989 Other specified abnormal findings of blood chemistry: Secondary | ICD-10-CM | POA: Diagnosis not present

## 2024-06-04 DIAGNOSIS — N951 Menopausal and female climacteric states: Secondary | ICD-10-CM

## 2024-06-04 DIAGNOSIS — R5383 Other fatigue: Secondary | ICD-10-CM

## 2024-06-04 DIAGNOSIS — E559 Vitamin D deficiency, unspecified: Secondary | ICD-10-CM | POA: Diagnosis not present

## 2024-06-04 DIAGNOSIS — Z1159 Encounter for screening for other viral diseases: Secondary | ICD-10-CM

## 2024-06-04 LAB — IRON,TIBC AND FERRITIN PANEL
Ferritin: 290 ng/mL — ABNORMAL HIGH (ref 15–150)
Iron Saturation: 20 % (ref 15–55)
Iron: 54 ug/dL (ref 27–139)
Total Iron Binding Capacity: 266 ug/dL (ref 250–450)
UIBC: 212 ug/dL (ref 118–369)

## 2024-06-04 MED ORDER — ESTRADIOL 0.0375 MG/24HR TD PTTW: 1.0000 | MEDICATED_PATCH | TRANSDERMAL | 0 refills | Status: AC

## 2024-06-04 MED ORDER — VITAMIN D (ERGOCALCIFEROL) 1.25 MG (50000 UNIT) PO CAPS: 50000.0000 [IU] | ORAL_CAPSULE | ORAL | 0 refills | Status: AC

## 2024-06-04 NOTE — Progress Notes (Unsigned)
 I,Victoria T Emmitt, CMA,acting as a neurosurgeon for Catheryn LOISE Slocumb, MD.,have documented all relevant documentation on the behalf of Catheryn LOISE Slocumb, MD,as directed by  Catheryn LOISE Slocumb, MD while in the presence of Catheryn LOISE Slocumb, MD.  Subjective:  Patient ID: [name] , female    DOB: [birth year] , 63 y.o.   MRN: [id number]  Chief Complaint  Patient presents with   BHRT    She is here today for f/u BHRT. She is currently using estradiol  patches twice weekly. She is also taking progesterone  nightly. Patient has no other concerns. She feels well on her current regimen. B12 high, Vit D low, Iron elevated. She states with theses fluctuating labs what could this signify.      HPI Discussed the use of AI scribe software for clinical note transcription with the patient, who gave verbal consent to proceed.  History of Present Illness [name] is a 63 year old female who presents with concerns about elevated iron levels and possible hemochromatosis.  She has a history of elevated iron levels noted in recent blood work. She is not taking any iron supplements, and her vitamins do not contain iron. Her ferritin levels are also high. She experiences persistent fatigue and memory difficulties, struggling to recall recent activities. She notes a change in skin color on her neck.  She has stopped taking her vitamins but continues to take sea moss, which is rich in iron. She denies alcohol consumption and has no known family history of hemochromatosis. She has not had an ultrasound of her liver but recalls a CT of her abdomen from 2013. No discomfort on her right side or under her breast, and she has not been informed of having a fatty liver.  Her vitamin D  levels are low, and her B12 levels are high. She has stopped receiving B12 shots. No hot flashes and she has no gallbladder.  She uses a cast iron skillet for cooking. She is unaware of any family members with similar conditions.    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    Gastroesophageal reflux disease    Hair loss 02/24/2015   High cholesterol    History of kidney stones    Hot flashes 02/24/2015   Hypertension    IBS (irritable bowel syndrome)    Lactose intolerance    Low back pain    Prediabetes    Sciatica    SOB (shortness of breath)      Family History  Problem Relation Age of Onset   Diabetes type II Mother    Coronary artery disease Mother    Diabetes type II Father    Coronary artery disease Father    Hypertension Father    Diabetes type II Sister    Kidney disease Sister    Thyroid  disease Sister    Hypertension Sister    Hypertension Sister    Asthma Sister    Diabetes Brother    Hypertension Brother    Colon cancer Neg Hx    Colon polyps Neg Hx    Breast cancer Neg Hx    Ovarian cancer Neg Hx    Uterine cancer Neg Hx    Ulcerative colitis Neg Hx    Celiac disease Neg Hx    Crohn's disease Neg Hx     Current Medications[1]   Allergies[2]  Review of Systems  Constitutional: Negative.   Respiratory: Negative.    Cardiovascular: Negative.   Neurological: Negative.   Psychiatric/Behavioral: Negative.       Today's Vitals   06/04/24 1442  BP: 122/80  Pulse: 61  Temp: 98.3 F (36.8 C)  SpO2: 98%  Weight: 192 lb 6.4 oz (87.3 kg)  Height: 5' 5 (1.651 m)   Body mass index is 32.02 kg/m.  Wt Readings from Last 3 Encounters:  06/04/24 192 lb 6.4 oz (87.3 kg)  05/26/24 184 lb (83.5 kg)  03/13/24 190 lb 9.6 oz (86.5 kg)    The 10-year ASCVD risk score (Arnett DK, et al., 2019) is: 5.6%   Values used to calculate the score:     Age: 64 years     Clinically relevant sex: Female     Is Non-Hispanic African American: Yes     Diabetic: No     Tobacco smoker: No     Systolic Blood Pressure: 122 mmHg     Is BP treated: Yes     HDL Cholesterol: 63 mg/dL      Total Cholesterol: 160 mg/dL  Objective:  Physical Exam      Assessment And Plan:   Assessment & Plan Female climacteric state  Encounter for HCV screening test for low risk patient  Other fatigue   Assessment & Plan Evaluation of elevated ferritin and iron studies Elevated ferritin and iron levels with differential diagnosis including hemochromatosis, secondary iron overload from supplements, and fatty liver. Possible secondary iron overload from sea moss supplements. Elevated liver enzymes suggest fatty liver. Iron levels not high enough for further hemochromatosis workup. - Ordered serum ferritin and iron studies. - Ordered hepatitis C screening. - Advised discontinuation of sea moss supplements. - If ferritin remains elevated, will order liver ultrasound.  Fatigue Chronic fatigue possibly related to elevated ferritin, vitamin D  deficiency, or fatty liver. Fatigue may be exacerbated by dietary habits and lack of exercise. - Address vitamin D  deficiency with supplementation. - Encouraged dietary modifications to reduce sugar intake. - Recommended exercise to improve liver function and energy levels.  Menopausal state on hormone therapy Currently on hormone therapy with estrogen patches and progesterone . Prescription issues noted with pharmacy. - Provided one more refill of estrogen patches. - Will adjust progesterone  dose if joint aches develop. - Will coordinate with pharmacy for progesterone  refill.  Vitamin D  deficiency Vitamin D  deficiency can contribute to fatigue and cognitive issues. - Prescribed high-dose vitamin D  once weekly for two months. - Will transition to over-the-counter vitamin D  after two months.  Screening for hepatitis C Screening indicated due to elevated ferritin and iron levels. - Ordered hepatitis C screening.   No orders of the defined types were placed in this encounter.    Return for 4 month bhrt f/u.SABRA  Patient was given opportunity  to ask questions. Patient verbalized understanding of the plan and was able to repeat key elements of the plan. All questions were answered to their satisfaction.    I, Catheryn LOISE Slocumb, MD, have reviewed all documentation for this visit. The documentation on 06/04/2024 for the exam, diagnosis, procedures, and orders are all accurate and complete.   IF YOU HAVE BEEN REFERRED TO A SPECIALIST, IT MAY TAKE 1-2 WEEKS TO SCHEDULE/PROCESS THE REFERRAL. IF YOU HAVE NOT HEARD FROM US /SPECIALIST IN TWO WEEKS, PLEASE GIVE US  A CALL AT [phone].      [1]  Current Outpatient Medications:    Brimonidine Tartrate (LUMIFY) 0.025 %  SOLN, Apply to eye., Disp: , Rfl:    cetirizine (ZYRTEC) 10 MG tablet, Take 10 mg by mouth daily as needed for allergies., Disp: , Rfl:    diclofenac  (VOLTAREN ) 75 MG EC tablet, Take 1 tablet (75 mg total) by mouth 2 (two) times daily., Disp: 30 tablet, Rfl: 2   escitalopram  (LEXAPRO ) 20 MG tablet, Take 1 tablet (20 mg total) by mouth daily., Disp: 90 tablet, Rfl: 1   estradiol  (VIVELLE -DOT) 0.0375 MG/24HR, Place 1 patch onto the skin 2 (two) times a week., Disp: 8 patch, Rfl: 1   GINKGO BILOBA EXTRACT PO, Take 120 mg by mouth., Disp: , Rfl:    hydrochlorothiazide  (HYDRODIURIL ) 25 MG tablet, Take 1 tablet (25 mg total) by mouth daily. Prn swelling, Disp: 90 tablet, Rfl: 0   Multiple Vitamin (MULTIVITAMIN PO), Take by mouth., Disp: , Rfl:    NIFEdipine  (PROCARDIA -XL/NIFEDICAL-XL) 30 MG 24 hr tablet, TAKE (1) TABLET BY MOUTH ONCE DAILY., Disp: 90 tablet, Rfl: 2   Nutritional Supplements (LIPOTROPIC COMPLEX PO), Take by mouth. (Patient taking differently: Take by mouth. Lipotropic Injection), Disp: , Rfl:    Omega-3 1000 MG CAPS, Take 1,000 mg by mouth daily., Disp: , Rfl:    prednisoLONE acetate (PRED FORTE) 1 % ophthalmic suspension, INSTILL 1 DROP INTO RIGHT EYE 4 TIMES DAILY FOR 1 WEEK,THEN 2 TIMES DAILY FOR 1 WEEK,THEN ONCE DAILY FOR 2 WEEK THEN STOP., Disp: , Rfl:     Probiotic Product (ALIGN) 4 MG CAPS, Take 4 mg by mouth daily., Disp: , Rfl:    Progesterone  Micronized (PROGESTERONE , BULK,) POWD, She is using compounded progesterone  SR 125mg  nightly, except Sundays, Disp: , Rfl:    rosuvastatin  (CRESTOR ) 5 MG tablet, Take 1 tablet (5 mg total) by mouth daily., Disp: 90 tablet, Rfl: 1   UNABLE TO FIND, Med Name: Seamoss, Disp: , Rfl:  [2]  Allergies Allergen Reactions   Amlodipine  Other (See Comments)    Causes swelling   Amoxicillin  Diarrhea   Hydrocodone  Itching   Lisinopril     Headache and dizziness   Metronidazole Hives   Norvasc  [Amlodipine  Besylate]     Unknown reaction    Plaquenil [Hydroxychloroquine Sulfate] Hives and Itching   Gabapentin      drowsy

## 2024-06-05 ENCOUNTER — Ambulatory Visit: Payer: Self-pay | Admitting: Internal Medicine

## 2024-06-05 DIAGNOSIS — R7989 Other specified abnormal findings of blood chemistry: Secondary | ICD-10-CM

## 2024-06-08 ENCOUNTER — Encounter: Payer: Self-pay | Admitting: Family Medicine

## 2024-06-08 ENCOUNTER — Encounter: Payer: Self-pay | Admitting: Internal Medicine

## 2024-06-08 NOTE — Assessment & Plan Note (Signed)
 Vitamin D  deficiency can contribute to fatigue and cognitive issues. - Prescribed high-dose vitamin D  once weekly for two months. - Will transition to over-the-counter vitamin D  after two months.

## 2024-06-08 NOTE — Assessment & Plan Note (Signed)
 Currently on hormone therapy with estrogen patches and progesterone . Prescription issues noted with pharmacy. - Provided one more refill of estrogen patches. - Will adjust progesterone  dose if joint aches develop. - Will coordinate with pharmacy for progesterone  refill.

## 2024-06-09 ENCOUNTER — Encounter (INDEPENDENT_AMBULATORY_CARE_PROVIDER_SITE_OTHER): Payer: Self-pay | Admitting: Family Medicine

## 2024-06-09 ENCOUNTER — Ambulatory Visit (INDEPENDENT_AMBULATORY_CARE_PROVIDER_SITE_OTHER): Admitting: Family Medicine

## 2024-06-09 VITALS — BP 138/74 | HR 58 | Temp 97.9°F | Ht 65.0 in | Wt 184.0 lb

## 2024-06-09 DIAGNOSIS — I10 Essential (primary) hypertension: Secondary | ICD-10-CM

## 2024-06-09 DIAGNOSIS — E7800 Pure hypercholesterolemia, unspecified: Secondary | ICD-10-CM

## 2024-06-09 DIAGNOSIS — Z6830 Body mass index (BMI) 30.0-30.9, adult: Secondary | ICD-10-CM | POA: Diagnosis not present

## 2024-06-09 DIAGNOSIS — E669 Obesity, unspecified: Secondary | ICD-10-CM | POA: Diagnosis not present

## 2024-06-09 DIAGNOSIS — E559 Vitamin D deficiency, unspecified: Secondary | ICD-10-CM

## 2024-06-09 DIAGNOSIS — R7303 Prediabetes: Secondary | ICD-10-CM | POA: Diagnosis not present

## 2024-06-09 NOTE — Progress Notes (Signed)
 "  SUBJECTIVE:  Chief Complaint: Obesity  Interim History: Patient did not get a chance to start the meal plan prior to appointment today.  She could not recall what foods needed to change from her meal plan.  We discussed meal plan today to refine any options that needed to change.  She does not like turkey or chicken and doesn't eat roast beef.  She is wondering if she can make a substitute at lunch with tuna.  Life is very busy for the next few weeks- everyone is going to her house for the holidays.  She is hosting for Christmas. She will be attending church service for New Year's Eve.  No plans for the month of January.   [name] is here to discuss her progress with her obesity treatment plan. She is on the Category 1 Plan and states she is following her eating plan approximately 0 % of the time. She states she is walking at the mall.    OBJECTIVE: Visit Diagnoses: Problem List Items Addressed This Visit       Other   Vitamin D  deficiency - Primary   Pure hypercholesterolemia   Other Visit Diagnoses       Essential hypertension         Prediabetes         BMI 30.0-30.9,adult         Obesity (BMI 30-39.9), STARTING BMI 30.8           Vitals Temp: 97.9 F (36.6 C) BP: 138/74 Pulse Rate: (!) 58 SpO2: 98 %   Anthropometric Measurements Height: 5' 5 (1.651 m) Weight: 184 lb (83.5 kg) BMI (Calculated): 30.62 Weight at Last Visit: 184 lb Weight Lost Since Last Visit: 0 Weight Gained Since Last Visit: 0 Starting Weight: 184 lb   Body Composition  Body Fat %: 38.3 % Fat Mass (lbs): 70.6 lbs Muscle Mass (lbs): 107.8 lbs Total Body Water  (lbs): 69.8 lbs Visceral Fat Rating : 10   Other Clinical Data Today's Visit #: 2 Starting Date: 05/26/24     ASSESSMENT AND PLAN: Assessment & Plan Vitamin D  deficiency Discussed importance of vitamin d  supplementation.  Vitamin d  supplementation has been shown to decrease fatigue, decrease risk of progression to insulin   resistance and then prediabetes, decreases risk of falling in older age and can even assist in decreasing depressive symptoms in PTSD.   Prescription for Vitamin D  sent in.   Essential hypertension Blood pressure controlled at appointment today.  No change in current treatment plan but will follow-up on blood pressures at subsequent appointments and make changes in medications accordingly and as tolerated. Prediabetes Pathophysiology of progression through insulin  resistance to prediabetes and diabetes was discussed at length today.  Patient to continue to monitor and be in control of total intake of snack calories which may be simple carbohydrates but should be consumed only after the patient has taken in all the nutrition for the day.  Macronutrient identification, classification and daily intake ratios were discussed.  Plan to repeat labs in 3 months to monitor both hemoglobin A1c and insulin  levels.  No medications at this time as patient is not having significant hunger or cravings that would make following meal plan more difficult.    Pure hypercholesterolemia The 10-year ASCVD risk score (Arnett DK, et al., 2019) is: 7.7%   Values used to calculate the score:     Age: 63 years     Clinically relevant sex: Female     Is Non-Hispanic African American:  Yes     Diabetic: No     Tobacco smoker: No     Systolic Blood Pressure: 138 mmHg     Is BP treated: Yes     HDL Cholesterol: 63 mg/dL     Total Cholesterol: 160 mg/dL Patient was previously on Crestor  but stopped due to leg pains.  Will reevaluate cholesterols in 3 to 4 months with dietary changes and lifestyle modifications and rerisk stratify on repeat labs. BMI 30.0-30.9,adult  Obesity (BMI 30-39.9), STARTING BMI 30.8    Diet: [name] is currently in the action stage of change. As such, her goal is to continue with weight loss efforts and has agreed to the Category 1 Plan.   Exercise:  No exercise has been prescribed at this  time.  Behavior Modification:  We discussed the following Behavioral Modification Strategies today: increasing lean protein intake, decreasing simple carbohydrates, increasing vegetables, meal planning and cooking strategies, and holiday eating strategies.   Return in about 3 weeks (around 06/30/2024).   She was informed of the importance of frequent follow up visits to maximize her success with intensive lifestyle modifications for her multiple health conditions.  Attestation Statements:   Reviewed by clinician on day of visit: allergies, medications, problem list, medical history, surgical history, family history, social history, and previous encounter notes.  [name] Cho, MD "

## 2024-06-09 NOTE — Assessment & Plan Note (Addendum)
 The 10-year ASCVD risk score (Arnett DK, et al., 2019) is: 7.7%   Values used to calculate the score:     Age: 63 years     Clinically relevant sex: Female     Is Non-Hispanic African American: Yes     Diabetic: No     Tobacco smoker: No     Systolic Blood Pressure: 138 mmHg     Is BP treated: Yes     HDL Cholesterol: 63 mg/dL     Total Cholesterol: 160 mg/dL Patient was previously on Crestor  but stopped due to leg pains.  Will reevaluate cholesterols in 3 to 4 months with dietary changes and lifestyle modifications and rerisk stratify on repeat labs.

## 2024-06-09 NOTE — Assessment & Plan Note (Signed)
 Discussed importance of vitamin d supplementation.  Vitamin d supplementation has been shown to decrease fatigue, decrease risk of progression to insulin resistance and then prediabetes, decreases risk of falling in older age and can even assist in decreasing depressive symptoms in PTSD.   Prescription for Vitamin D sent in.

## 2024-06-10 ENCOUNTER — Other Ambulatory Visit: Payer: Self-pay | Admitting: Family Medicine

## 2024-06-11 LAB — SPECIMEN STATUS REPORT

## 2024-06-11 LAB — HEPATITIS C ANTIBODY: Hep C Virus Ab: NONREACTIVE

## 2024-06-17 ENCOUNTER — Ambulatory Visit (HOSPITAL_COMMUNITY)
Admission: RE | Admit: 2024-06-17 | Discharge: 2024-06-17 | Disposition: A | Discharge status: Home / Self Care | Source: Ambulatory Visit | Attending: Internal Medicine | Admitting: Internal Medicine

## 2024-06-17 DIAGNOSIS — Z9049 Acquired absence of other specified parts of digestive tract: Secondary | ICD-10-CM | POA: Diagnosis not present

## 2024-06-17 DIAGNOSIS — R7989 Other specified abnormal findings of blood chemistry: Secondary | ICD-10-CM | POA: Insufficient documentation

## 2024-06-17 DIAGNOSIS — N281 Cyst of kidney, acquired: Secondary | ICD-10-CM | POA: Diagnosis not present

## 2024-07-07 ENCOUNTER — Other Ambulatory Visit: Payer: Self-pay | Admitting: Family Medicine

## 2024-07-07 ENCOUNTER — Ambulatory Visit: Payer: Self-pay | Admitting: Family Medicine

## 2024-07-07 MED ORDER — BENZONATATE 100 MG PO CAPS: 100.0000 mg | ORAL_CAPSULE | Freq: Three times a day (TID) | ORAL | 1 refills | Status: AC | PRN

## 2024-07-07 NOTE — Telephone Encounter (Signed)
 I read through the triage Typically coughs that have started within the past 48 hours without fever or shortness of breath or viruses Typically they can take 4 or 5 days all the way up to 8 to 10 days to totally clear If she develops shortness of breath high fever chills or progressive symptoms I would recommend to be seen right away  As for the cough-cough is there for reason-medication can minimize the cough but it will not take the cough away typically once the virus runs its course the cough does get better and goes away.  In the situations Tessalon  Perle can take the edge off the cough.  A prescription was sent to [facility] pharmacy-this medication is swallowed-utilize 3 times daily as needed-takes the edge off the cough  Thanks-Dr. Glendia

## 2024-07-07 NOTE — Telephone Encounter (Signed)
 FYI Only or Action Required?: Action required by provider: Home care advised, Requesting cough syrup.  Patient was last seen in primary care on 06/09/2024 by Anita Adelita PENNER, MD.  Called Nurse Triage reporting Cough.  Symptoms began several days ago.  Interventions attempted: OTC medications: Tylenol , Delsym, Coricidin.  Symptoms are: stable.  Triage Disposition: Home Care  Patient/caregiver understands and will follow disposition?: Yes   Reason for Disposition  Cough  Answer Assessment - Initial Assessment Questions Pt reports taking Tylenol  and Delsym, coricidin. Advised home care, patient also requesting cough syrup (Promethazine -DM) be [facility] into [name] on [facility]. Please advise. CB : [phone]  1. ONSET: When did the cough begin?      2 days ago  2. SPUTUM: Describe the color of your sputum (e.g., none, dry cough; clear, white, yellow, green)     Unable to cough it up   3. HEMOPTYSIS: Are you coughing up any blood? If Yes, ask: How much? (e.g., flecks, streaks, tablespoons, etc.)     Denies  4. DIFFICULTY BREATHING: Are you having difficulty breathing? If Yes, ask: How bad is it? (e.g., mild, moderate, severe)      Denies  5. FEVER: Do you have a fever? If Yes, ask: What is your temperature, how was it measured, and when did it start?     Unable to assess  6. CARDIAC HISTORY: Do you have any history of heart disease? (e.g., heart attack, congestive heart failure)      Denies  7. LUNG HISTORY: Do you have any history of lung disease?  (e.g., pulmonary embolus, asthma, emphysema)     Denies  8. OTHER SYMPTOMS: Do you have any other symptoms? (e.g., runny nose, wheezing, chest pain)       headache, denies blurred vision. Sneezing, chills, scratchy throat, body aches, nasal congestion / runny nose Denies N/V/D.  Protocols used: Cough - Acute Non-Productive-A-AH  Message from Roseland F sent at 07/07/2024  8:14 AM EST  Reason for  Triage: severe headache, congestion, mucus in chest that she cannot cough up, chills, sneezing.

## 2024-07-15 ENCOUNTER — Ambulatory Visit (INDEPENDENT_AMBULATORY_CARE_PROVIDER_SITE_OTHER): Admitting: Family Medicine

## 2024-07-24 ENCOUNTER — Ambulatory Visit: Admitting: Dermatology

## 2024-07-24 ENCOUNTER — Other Ambulatory Visit: Payer: Self-pay

## 2024-07-24 ENCOUNTER — Encounter: Payer: Self-pay | Admitting: Dermatology

## 2024-07-24 DIAGNOSIS — L819 Disorder of pigmentation, unspecified: Secondary | ICD-10-CM

## 2024-07-24 DIAGNOSIS — L814 Other melanin hyperpigmentation: Secondary | ICD-10-CM

## 2024-07-24 DIAGNOSIS — L659 Nonscarring hair loss, unspecified: Secondary | ICD-10-CM

## 2024-07-24 DIAGNOSIS — L6612 Frontal fibrosing alopecia: Secondary | ICD-10-CM

## 2024-07-24 MED ORDER — SAFETY SEAL MISCELLANEOUS MISC: 1.0000 | Freq: Every morning | 6 refills | Status: AC

## 2024-07-24 MED ORDER — SAFETY SEAL MISCELLANEOUS MISC: 1.0000 | Freq: Every morning | 6 refills | Status: DC

## 2024-07-24 NOTE — Patient Instructions (Addendum)
 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 We reviewed your current symptoms, treatment history, and concerns about hair management and financial burden. We have developed a treatment plan to address these issues and will follow up to monitor your progress.  YOUR PLAN:  -FRONTAL FIBROSING ALOPECIA:  Frontal fibrosing alopecia is a condition where inflammation at the hair follicles leads to scarring and permanent hair loss, often triggered by hormonal changes, stress, or viral illness.   To manage this, you have been prescribed a mixture of clobetasol and minoxidil to be applied once daily in the morning. Apply the treatment to your eyebrows using a Q-tip for precision.   Change your hair braiding patterns to prevent further hair loss from tension, and use a satin band under wigs to protect your scalp. Additionally, consider taking a collagen supplement to strengthen your hair and promote regrowth. We will follow up in six months to assess the effectiveness of this treatment.  - HYPERPIGMENTATION OF THE NECK:   You have been using hydroquinone cream for two months to treat this. Continue using the hydroquinone for two more months, then discontinue to prevent irritation.   After stopping hydroquinone, use the provided samples of Eucerin Radiant Tone. Remember to use sunscreen daily when not wearing turtlenecks. We will follow up in October to reassess your pigmentation and consider restarting hydroquinone.  INSTRUCTIONS:  Follow up in six months to assess the effectiveness of the treatment for frontal fibrosing alopecia. Follow up in October to reassess the pigmentation on your neck and consider restarting hydroquinone.        Important Information  Due to recent changes in healthcare laws, you may see results of your pathology and/or laboratory studies on MyChart before the doctors  have had a chance to review them. We understand that in some cases there may be results that are confusing or concerning to you. Please understand that not all results are received at the same time and often the doctors may need to interpret multiple results in order to provide you with the best plan of care or course of treatment. Therefore, we ask that you please give us  2 business days to thoroughly review all your results before contacting the office for clarification. Should we see a critical lab result, you will be contacted sooner.   If You Need Anything After Your Visit  If you have any questions or concerns for your doctor, please call our main line at [phone] If no one answers, please leave a voicemail as directed and we will return your call as soon as possible. Messages left after 4 pm will be answered the following business day.   You may also send us  a message via MyChart. We typically respond to MyChart messages within 1-2 business days.  For prescription refills, please ask your pharmacy to contact our office. Our fax number is [phone].  If you have an urgent issue when the clinic is closed that cannot wait until the next business day, you can page your doctor at the number below.    Please note that while we do our best to be available for urgent issues outside of office hours, we are not available 24/7.   If you have an urgent issue and are unable to reach us , you may choose to seek medical care at your doctor's office, retail clinic, urgent care center, or emergency room.  If you have  a medical emergency, please immediately call 911 or go to the emergency department. In the event of inclement weather, please call our main line at [phone] for an update on the status of any delays or closures.  Dermatology Medication Tips: Please keep the boxes that topical medications come in in order to help keep track of the instructions about where and how to use these.  Pharmacies typically print the medication instructions only on the boxes and not directly on the medication tubes.   If your medication is too expensive, please contact our office at [phone] or send us  a message through MyChart.   We are unable to tell what your co-pay for medications will be in advance as this is different depending on your insurance coverage. However, we may be able to find a substitute medication at lower cost or fill out paperwork to get insurance to cover a needed medication.   If a prior authorization is required to get your medication covered by your insurance company, please allow us  1-2 business days to complete this process.  Drug prices often vary depending on where the prescription is filled and some pharmacies may offer cheaper prices.  The website www.goodrx.com contains coupons for medications through different pharmacies. The prices here do not account for what the cost may be with help from insurance (it may be cheaper with your insurance), but the website can give you the price if you did not use any insurance.  - You can print the associated coupon and take it with your prescription to the pharmacy.  - You may also stop by our office during regular business hours and pick up a GoodRx coupon card.  - If you need your prescription sent electronically to a different pharmacy, notify our office through [facility] [place] or by phone at [phone]     "

## 2024-07-24 NOTE — Progress Notes (Signed)
" ° °  New Patient Visit   Subjective  [name] is a 64 y.o. female who presents for the following:  frontal fibrosing alopecia (FFA)   Located at the scalp that she would like to have examined. Patient reports the areas have been there for 9 years Patient reports she lost her eyebrows and eyelashes first and then went to frontal hairline and spread throughout her scalp She reports the areas are sometimes tender She states that the areas have spread.  Patient reports she has previously been treated for these areas and was prescribed a topical but cannot recall what it was but states that it did not help with symptoms  Patient reports that she has tried rogaine, biotin, rosemary oils and various other OTC topicals that claim to promote hair loss and symptoms did not improve Patient states that she did wear tight hair styles when she was young Patient states that she used to use relaxers but that it has been around 10 years since her last one  The following portions of the chart were reviewed this encounter and updated as appropriate: medications, allergies, medical history  Review of Systems:  No other skin or systemic complaints except as noted in HPI or Assessment and Plan.  Objective  Well appearing patient in no apparent distress; mood and affect are within normal limits.  A focused examination was performed of the following areas: scalp  Relevant exam findings are noted in the Assessment and Plan.    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 No current use of topical treatments. Condition is localized autoimmune, often triggered by hormonal changes, stress, or viral illness. Inflammation at hair follicles leads to scarring and permanent hair loss. Treatment aims to halt progression and potentially stimulate hair regrowth.  - Prescribed clobetasol and prescription  minoxidil mixture to be applied once daily in the morning. - Advised to apply treatment to eyebrows using a Q-tip for precision. - Recommended changing hair braiding patterns to prevent traction alopecia. - Suggested using a satin band under wigs to protect the scalp. - Recommended collagen supplement to strengthen hair and promote regrowth. - Scheduled follow-up in six months to assess treatment efficacy.  Hyperpigmentation of the neck Postinflammatory hyperpigmentation on the neck, treated with hydroquinone for two months. Improvement expected in three to four months. Hydroquinone is the strongest available treatment, but requires a break after four months to prevent irritation.  - Continue hydroquinone for two more months, then discontinue. - Provided samples of Muserin Radiant Tone for use after hydroquinone break. - Advised daily sunscreen use during non-turtleneck season. - Scheduled follow-up in October to reassess pigmentation and consider restarting hydroquinone. ALOPECIA   This Visit - Safety Seal Miscellaneous MISC - Apply 1 Application topically in the morning. Medication name: AA Gel (Minoxidil 8% and Clobetasol 0.05%)  Return in 8 months (on 03/23/2025) for alopecia f/u.  I, Lyle Cords, as acting as a neurosurgeon for [employer], DO .   Documentation: I have reviewed the above documentation for accuracy and completeness, and I agree with the above.  Delon Lenis, DO     "

## 2024-07-30 ENCOUNTER — Ambulatory Visit (INDEPENDENT_AMBULATORY_CARE_PROVIDER_SITE_OTHER): Admitting: Family Medicine

## 2024-08-06 ENCOUNTER — Ambulatory Visit: Admitting: Family Medicine

## 2024-10-07 ENCOUNTER — Ambulatory Visit: Payer: Self-pay | Admitting: Internal Medicine

## 2025-04-06 ENCOUNTER — Ambulatory Visit: Admitting: Dermatology
